# Patient Record
Sex: Female | Born: 1955 | Race: White | Hispanic: No | Marital: Married | Smoking: Never smoker
Health system: Southern US, Community
[De-identification: ages and names within clinical notes are randomized; demographics above are authoritative.]

## PROBLEM LIST (undated history)

## (undated) DIAGNOSIS — Z9889 Other specified postprocedural states: Secondary | ICD-10-CM

## (undated) DIAGNOSIS — IMO0002 Reserved for concepts with insufficient information to code with codable children: Secondary | ICD-10-CM

## (undated) DIAGNOSIS — R531 Weakness: Secondary | ICD-10-CM

## (undated) DIAGNOSIS — M542 Cervicalgia: Secondary | ICD-10-CM

## (undated) DIAGNOSIS — T4145XA Adverse effect of unspecified anesthetic, initial encounter: Secondary | ICD-10-CM

## (undated) DIAGNOSIS — F329 Major depressive disorder, single episode, unspecified: Secondary | ICD-10-CM

## (undated) DIAGNOSIS — J45909 Unspecified asthma, uncomplicated: Secondary | ICD-10-CM

## (undated) DIAGNOSIS — M549 Dorsalgia, unspecified: Secondary | ICD-10-CM

## (undated) DIAGNOSIS — F32A Depression, unspecified: Secondary | ICD-10-CM

## (undated) DIAGNOSIS — J189 Pneumonia, unspecified organism: Secondary | ICD-10-CM

## (undated) DIAGNOSIS — T8859XA Other complications of anesthesia, initial encounter: Secondary | ICD-10-CM

## (undated) DIAGNOSIS — F419 Anxiety disorder, unspecified: Secondary | ICD-10-CM

## (undated) DIAGNOSIS — G8929 Other chronic pain: Secondary | ICD-10-CM

## (undated) DIAGNOSIS — R112 Nausea with vomiting, unspecified: Secondary | ICD-10-CM

## (undated) DIAGNOSIS — G4700 Insomnia, unspecified: Secondary | ICD-10-CM

## (undated) DIAGNOSIS — M62838 Other muscle spasm: Secondary | ICD-10-CM

## (undated) DIAGNOSIS — Z8709 Personal history of other diseases of the respiratory system: Secondary | ICD-10-CM

## (undated) HISTORY — PX: CHOLECYSTECTOMY: SHX55

## (undated) HISTORY — PX: KNEE SURGERY: SHX244

## (undated) HISTORY — PX: DILATION AND CURETTAGE OF UTERUS: SHX78

## (undated) HISTORY — PX: BACK SURGERY: SHX140

## (undated) HISTORY — PX: OTHER SURGICAL HISTORY: SHX169

## (undated) HISTORY — PX: SHOULDER SURGERY: SHX246

## (undated) HISTORY — PX: JOINT REPLACEMENT: SHX530

## (undated) HISTORY — PX: SPINE SURGERY: SHX786

---

## 1998-02-26 ENCOUNTER — Other Ambulatory Visit: Admission: RE | Admit: 1998-02-26 | Discharge: 1998-02-26 | Payer: Self-pay | Admitting: *Deleted

## 2001-09-10 ENCOUNTER — Emergency Department (HOSPITAL_COMMUNITY): Admission: EM | Admit: 2001-09-10 | Discharge: 2001-09-10 | Payer: Self-pay | Admitting: *Deleted

## 2002-10-14 ENCOUNTER — Other Ambulatory Visit: Admission: RE | Admit: 2002-10-14 | Discharge: 2002-10-14 | Payer: Self-pay | Admitting: *Deleted

## 2005-10-23 ENCOUNTER — Encounter (HOSPITAL_COMMUNITY): Admission: RE | Admit: 2005-10-23 | Discharge: 2005-11-22 | Payer: Self-pay | Admitting: Family Medicine

## 2005-11-10 ENCOUNTER — Encounter (INDEPENDENT_AMBULATORY_CARE_PROVIDER_SITE_OTHER): Payer: Self-pay | Admitting: *Deleted

## 2005-11-10 ENCOUNTER — Ambulatory Visit (HOSPITAL_COMMUNITY): Admission: RE | Admit: 2005-11-10 | Discharge: 2005-11-10 | Payer: Self-pay | Admitting: General Surgery

## 2006-10-06 ENCOUNTER — Ambulatory Visit (HOSPITAL_COMMUNITY): Admission: RE | Admit: 2006-10-06 | Discharge: 2006-10-06 | Payer: Self-pay | Admitting: Family Medicine

## 2007-04-20 ENCOUNTER — Encounter: Admission: RE | Admit: 2007-04-20 | Discharge: 2007-04-20 | Payer: Self-pay | Admitting: Orthopedic Surgery

## 2007-05-07 ENCOUNTER — Encounter: Admission: RE | Admit: 2007-05-07 | Discharge: 2007-05-07 | Payer: Self-pay | Admitting: Orthopedic Surgery

## 2007-09-09 ENCOUNTER — Emergency Department (HOSPITAL_COMMUNITY): Admission: EM | Admit: 2007-09-09 | Discharge: 2007-09-09 | Payer: Self-pay | Admitting: Emergency Medicine

## 2007-11-15 ENCOUNTER — Ambulatory Visit (HOSPITAL_COMMUNITY): Admission: RE | Admit: 2007-11-15 | Discharge: 2007-11-15 | Payer: Self-pay | Admitting: Orthopedic Surgery

## 2007-11-15 ENCOUNTER — Ambulatory Visit: Payer: Self-pay | Admitting: Surgery

## 2007-11-15 ENCOUNTER — Encounter (INDEPENDENT_AMBULATORY_CARE_PROVIDER_SITE_OTHER): Payer: Self-pay | Admitting: Orthopedic Surgery

## 2007-11-24 ENCOUNTER — Encounter: Admission: RE | Admit: 2007-11-24 | Discharge: 2007-11-24 | Payer: Self-pay | Admitting: Orthopedic Surgery

## 2008-01-21 ENCOUNTER — Emergency Department (HOSPITAL_COMMUNITY): Admission: EM | Admit: 2008-01-21 | Discharge: 2008-01-21 | Payer: Self-pay | Admitting: Emergency Medicine

## 2008-05-23 ENCOUNTER — Emergency Department (HOSPITAL_COMMUNITY): Admission: EM | Admit: 2008-05-23 | Discharge: 2008-05-23 | Payer: Self-pay | Admitting: Emergency Medicine

## 2009-02-12 ENCOUNTER — Encounter: Admission: RE | Admit: 2009-02-12 | Discharge: 2009-02-12 | Payer: Self-pay | Admitting: Family Medicine

## 2009-06-11 ENCOUNTER — Ambulatory Visit (HOSPITAL_COMMUNITY): Admission: RE | Admit: 2009-06-11 | Discharge: 2009-06-11 | Payer: Self-pay | Admitting: Family Medicine

## 2009-07-16 ENCOUNTER — Inpatient Hospital Stay (HOSPITAL_COMMUNITY): Admission: RE | Admit: 2009-07-16 | Discharge: 2009-07-19 | Payer: Self-pay | Admitting: Neurosurgery

## 2010-02-05 ENCOUNTER — Ambulatory Visit (HOSPITAL_COMMUNITY): Admission: RE | Admit: 2010-02-05 | Discharge: 2010-02-05 | Payer: Self-pay | Admitting: Orthopedic Surgery

## 2010-05-10 IMAGING — RF DG LUMBAR SPINE 2-3V
1 series · 2 of 2 positions shown · non-contrast
Comparison: Earlier intraoperative spot films

CLINICAL DATA: Post lumbar fusion

LUMBAR SPINE - 2-3 VIEW

[Series 1: run · 2 of 2 slices shown]
[im 1/2]
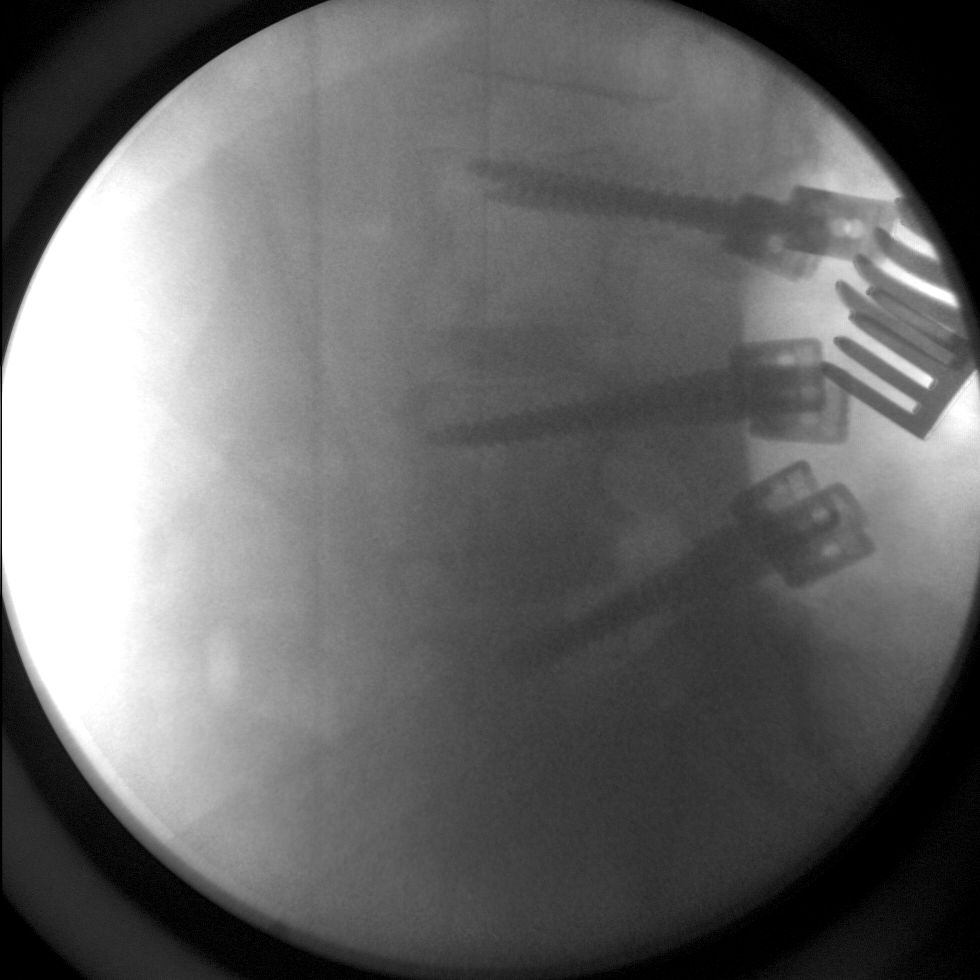
[im 2/2]
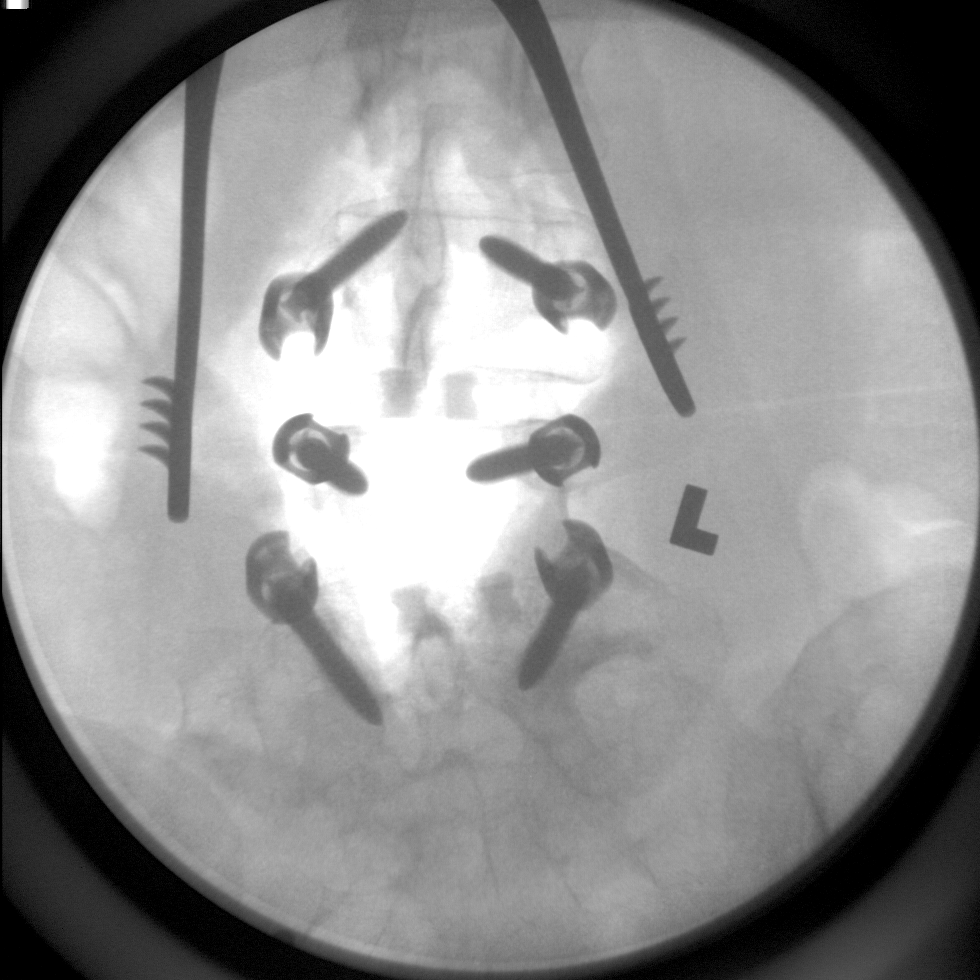

[2 of 2 positions shown; findings below may reference images not displayed]

FINDINGS: AP and lateral views were obtained using a C-arm in the
operating room.

The lateral view is somewhat under penetrated.

The patient has undergone L3-4 and L4-5 discectomy with interbody
fusion and bilateral screw placement.

Good position and alignment into views.
IMPRESSION: Status post L3-L5 fusion with fixation as above.

## 2010-06-13 ENCOUNTER — Ambulatory Visit (HOSPITAL_COMMUNITY)
Admission: RE | Admit: 2010-06-13 | Discharge: 2010-06-13 | Payer: Self-pay | Source: Home / Self Care | Attending: Family Medicine | Admitting: Family Medicine

## 2010-07-21 ENCOUNTER — Encounter: Payer: Self-pay | Admitting: Orthopedic Surgery

## 2010-08-28 ENCOUNTER — Other Ambulatory Visit: Payer: Self-pay | Admitting: Orthopedic Surgery

## 2010-08-28 DIAGNOSIS — M542 Cervicalgia: Secondary | ICD-10-CM

## 2010-08-28 DIAGNOSIS — M5020 Other cervical disc displacement, unspecified cervical region: Secondary | ICD-10-CM

## 2010-08-29 ENCOUNTER — Ambulatory Visit
Admission: RE | Admit: 2010-08-29 | Discharge: 2010-08-29 | Disposition: A | Discharge status: Home / Self Care | Payer: BC Managed Care – PPO | Source: Ambulatory Visit | Attending: Orthopedic Surgery | Admitting: Orthopedic Surgery

## 2010-08-29 DIAGNOSIS — M542 Cervicalgia: Secondary | ICD-10-CM

## 2010-08-29 DIAGNOSIS — M5020 Other cervical disc displacement, unspecified cervical region: Secondary | ICD-10-CM

## 2010-09-15 LAB — CBC
HCT: 42.7 % (ref 36.0–46.0)
Hemoglobin: 15 g/dL (ref 12.0–15.0)
MCHC: 35.1 g/dL (ref 30.0–36.0)
MCV: 91.3 fL (ref 78.0–100.0)
Platelets: 232 10*3/uL (ref 150–400)
RBC: 4.68 MIL/uL (ref 3.87–5.11)
RDW: 12.2 % (ref 11.5–15.5)
WBC: 5.2 10*3/uL (ref 4.0–10.5)

## 2010-09-15 LAB — TYPE AND SCREEN
ABO/RH(D): O POS
Antibody Screen: NEGATIVE

## 2010-09-15 LAB — ABO/RH: ABO/RH(D): O POS

## 2010-11-15 NOTE — H&P (Signed)
NAME:  Stephanie, Noble NO.:  [id number]   MEDICAL RECORD NO.:  [id number]           PATIENT TYPE:  AMB   LOCATION:                                FACILITY:  [facility]   PHYSICIAN:  Dalia Heading, M.D.  DATE OF BIRTH:  [birth year]   DATE OF ADMISSION:  11/10/2005  DATE OF DISCHARGE:  LH                                HISTORY & PHYSICAL   CHIEF COMPLAINT:  Chronic cholecystitis.   HISTORY OF PRESENT ILLNESS:  The patient is a 55 year old white female who  is referred for evaluation and treatment of biliary colic secondary to  chronic cholecystitis.  The patient has been having right upper quadrant  abdominal pain with radiation to the right flank, nausea, and bloating for  many years.  She does have fatty food intolerance.  No fever, chills, or  jaundice have been noted.   PAST MEDICAL HISTORY:  Unremarkable.   PAST SURGICAL HISTORY:  1.  Spinal fusion.  2.  D&C.  3.  Arthroscopy, left knee.   CURRENT MEDICATIONS:  None.   ALLERGIES:  No known drug allergies.   REVIEW OF SYSTEMS:  Noncontributory.   PHYSICAL EXAMINATION:  GENERAL:  The patient is a well-developed, well-  nourished white female in no acute distress.  HEENT:  No scleral icterus.  LUNGS:  Clear to auscultation with equal breath sounds bilaterally.  HEART:  Heart examination reveals a regular rate and rhythm without S3, S4,  or murmurs.  ABDOMEN:  Soft and nondistended.  She is tender in the right upper quadrant  to deep palpation.  No hepatosplenomegaly, masses, hernias are identified.  Ultrasound of the gallbladder reveals no cholelithiasis.  HIDA scan reveals  chronic cholecystitis with a low gallbladder ejection fraction, reproducible  symptoms with a fatty meal.   IMPRESSION:  Chronic cholecystitis.   PLAN:  The patient is scheduled for a laparoscopic cholecystectomy on Nov 10, 2005.  The risks and benefits of the procedure including bleeding,  infection, hepatobiliary injury, and the  possibility of an open procedure  were fully explained to the patient, gave informed consent.      Dalia Heading, M.D.  Electronically Signed     MAJ/MEDQ  D:  11/04/2005  T:  11/04/2005  Job:  [id number]   cc:   Donna Bernard, M.D.  Fax: [phone]

## 2010-11-15 NOTE — Op Note (Signed)
NAME:  Stephanie Noble, PARKINSON NO.:  [id number]   MEDICAL RECORD NO.:  [id number]          PATIENT TYPE:  AMB   LOCATION:  DAY                           FACILITY:  [facility]   PHYSICIAN:  Dalia Heading, M.D.  DATE OF BIRTH:  05-15-1956   DATE OF PROCEDURE:  11/10/2005  DATE OF DISCHARGE:                                 OPERATIVE REPORT   PREOPERATIVE DIAGNOSES:  1.  Chronic cholecystitis.  2.  Umbilical hernia.   POSTOPERATIVE DIAGNOSES:  1.  Chronic cholecystitis.  2.  Umbilical hernia.   PROCEDURES:  1.  Laparoscopic cholecystectomy.  2.  Umbilical herniorrhaphy.   SURGEON:  Dalia Heading, M.D.   ASSISTANT:  Bernerd Limbo. Leona Carry, M.D.   ANESTHESIA:  General endotracheal.   INDICATIONS:  The patient is a 55 year old white female who presents with  biliary colic secondary to chronic cholecystitis as well as an umbilical  hernia.  The patient now comes the operating for laparoscopic  cholecystectomy and umbilical herniorrhaphy.  The risks and benefits of both  procedures, including bleeding, infection, hepatobiliary injury, the  possibly an open procedure, and the possibility of recurrence of the hernia  were fully explained to the patient, who gave informed consent.   PROCEDURE NOTE:  The patient was placed in the supine position.  After  induction of general endotracheal anesthesia, the abdomen was prepped and  draped using the usual sterile technique with Betadine.  Surgical site  confirmation was performed.   An infraumbilical incision was made down to the fascia.  Veress needle was  introduced into the abdominal cavity, and confirmation of placement was done  using the saline drop test.  The abdomen was then insufflated to 16 mmHg  pressure.  A 11 mm trocar was introduced into the abdominal cavity under  direct visualization without difficulty.  The patient was placed in reverse  Trendelenburg position.  An additional 11 mm trocar was placed in the  epigastric region.  A 5 mm trocar was placed in the right upper quadrant and  right flank regions.  The liver was inspected and noted to be within normal  limits.  The gallbladder was retracted superiorly and laterally.  The  dissection was taken down to the infundibulum of the gallbladder.  The  cystic duct was first identified.  Its juncture to the infundibulum was  fully identified.  Endoclips were placed proximally distally on the cystic  duct, and the cystic duct was divided.  This was likewise done to the cystic  artery.  The gallbladder was then freed away from the gallbladder fossa  using Bovie electrocautery.  The gallbladder was delivered through the  epigastric trocar site using an EndoCatch bag.  The gallbladder fossa was  inspected.  No abnormal bleeding or bile leakage was noted.  Surgicel was  placed in the gallbladder fossa.  All fluid and air were then evacuated from  the abdominal cavity prior to removal of the trocars.   All wounds were irrigated with normal saline.  The infraumbilical skin was  extended and the umbilicus freed away from the underlying umbilical hernia  defect.  The fascia was identified.  Two separate holes were noted and were  made into one.  The fascia was then closed transversely using 0 Surgidek  interrupted sutures.  The base of the umbilicus was secured back to the  fascia using a 2-0 Vicryl interrupted suture.  The subcutaneous layer was  reapproximated using a 3-0 Vicryl interrupted suture.  The skin was closed  using staples.  The other skin incisions were also closed with staples.  0.5  cm Sensorcaine was instilled into the surrounding incisions.  Betadine  ointment and dry sterile dressing were applied.   All tape and needle counts were correct and the procedure.  The patient was  extubated in the operating room and went back to the recovery room awake, in  stable condition.   COMPLICATIONS:  None.   SPECIMEN:  Gallbladder.   BLOOD  LOSS:  Minimal.      Dalia Heading, M.D.  Electronically Signed     MAJ/MEDQ  D:  11/10/2005  T:  11/10/2005  Job:  [id number]   cc:   Donna Bernard, M.D.  Fax: [phone]

## 2011-04-01 LAB — URINALYSIS, ROUTINE W REFLEX MICROSCOPIC
Glucose, UA: NEGATIVE
Ketones, ur: >80 — AB
Nitrite: NEGATIVE
Urobilinogen, UA: 0.2
pH: 5.5

## 2011-04-01 LAB — CBC
HCT: 40.4
Hemoglobin: 14.1
MCHC: 35
Platelets: 228
RDW: 12.2

## 2011-04-01 LAB — DIFFERENTIAL
Lymphs Abs: 1.2
Monocytes Absolute: 0.4
Monocytes Relative: 7
Neutro Abs: 4.2
Neutrophils Relative %: 72

## 2011-04-01 LAB — COMPREHENSIVE METABOLIC PANEL
Albumin: 3.5
BUN: 10
Calcium: 8.5
Glucose, Bld: 102 — ABNORMAL HIGH
Potassium: 3.6
Sodium: 133 — ABNORMAL LOW
Total Protein: 6.5

## 2011-04-01 LAB — POCT CARDIAC MARKERS
CKMB, poc: <1 — ABNORMAL LOW
Myoglobin, poc: 55.9
Troponin i, poc: <0.05
Troponin i, poc: <0.05

## 2011-04-01 LAB — CULTURE, BLOOD (ROUTINE X 2)
Culture: NO GROWTH
Report Status: 11292009

## 2011-04-01 LAB — URINE MICROSCOPIC-ADD ON

## 2011-04-10 ENCOUNTER — Encounter (HOSPITAL_COMMUNITY)
Admission: RE | Admit: 2011-04-10 | Discharge: 2011-04-10 | Disposition: A | Discharge status: Home / Self Care | Payer: BC Managed Care – PPO | Source: Ambulatory Visit | Attending: Neurosurgery | Admitting: Neurosurgery

## 2011-04-10 LAB — CBC
HCT: 36.9 % (ref 36.0–46.0)
Hemoglobin: 12.5 g/dL (ref 12.0–15.0)
MCH: 29.4 pg (ref 26.0–34.0)
MCHC: 33.9 g/dL (ref 30.0–36.0)
MCV: 86.8 fL (ref 78.0–100.0)
Platelets: 194 10*3/uL (ref 150–400)
RBC: 4.25 MIL/uL (ref 3.87–5.11)
RDW: 14.4 % (ref 11.5–15.5)
WBC: 4.6 10*3/uL (ref 4.0–10.5)

## 2011-04-10 LAB — SURGICAL PCR SCREEN
MRSA, PCR: NEGATIVE
Staphylococcus aureus: NEGATIVE

## 2011-04-16 ENCOUNTER — Ambulatory Visit (HOSPITAL_COMMUNITY): Payer: BC Managed Care – PPO

## 2011-04-16 ENCOUNTER — Inpatient Hospital Stay (HOSPITAL_COMMUNITY)
Admission: RE | Admit: 2011-04-16 | Discharge: 2011-04-17 | DRG: 865 | Disposition: A | Discharge status: Home / Self Care | Payer: BC Managed Care – PPO | Source: Ambulatory Visit | Attending: Neurosurgery | Admitting: Neurosurgery

## 2011-04-16 DIAGNOSIS — M5030 Other cervical disc degeneration, unspecified cervical region: Secondary | ICD-10-CM | POA: Diagnosis present

## 2011-04-16 DIAGNOSIS — Q762 Congenital spondylolisthesis: Secondary | ICD-10-CM

## 2011-04-16 DIAGNOSIS — M47812 Spondylosis without myelopathy or radiculopathy, cervical region: Principal | ICD-10-CM | POA: Diagnosis present

## 2011-04-16 DIAGNOSIS — Z01812 Encounter for preprocedural laboratory examination: Secondary | ICD-10-CM

## 2011-04-16 DIAGNOSIS — J45909 Unspecified asthma, uncomplicated: Secondary | ICD-10-CM | POA: Diagnosis present

## 2011-04-16 DIAGNOSIS — Z79899 Other long term (current) drug therapy: Secondary | ICD-10-CM

## 2011-04-18 NOTE — Op Note (Signed)
NAME:  Stephanie Noble, Stephanie Noble NO.:  [id number]  MEDICAL RECORD NO.:  [id number]  LOCATION:  3524                         FACILITY:  [facility]  PHYSICIAN:  Hewitt Shorts, M.D.DATE OF BIRTH:  Oct 20, 1955  DATE OF PROCEDURE:  04/16/2011 DATE OF DISCHARGE:                              OPERATIVE REPORT   PREOPERATIVE DIAGNOSES:  Cervical spondylosis, cervical degenerative disk disease, cervical spondylolisthesis and cervicalgia.  POSTOPERATIVE DIAGNOSES:  Cervical spondylosis, cervical degenerative disk disease, cervical spondylolisthesis and cervicalgia.  PROCEDURE:  C4-5, C5-6 and C6-7 anterior cervical decompression arthrodesis with allograft and tether cervical plating.  SURGEON:  Hewitt Shorts, MD  ASSISTANT:  Cristi Loron, MD  ANESTHESIA:  General endotracheal.  INDICATIONS:  The patient is a 55 year old woman who presented with steadily worsening neck pain.  She has advanced degenerative disk disease and spondylosis at C5-6 and C6-7 levels with an anterolisthesis of C4 and 5.  Decision was made to proceed with three-level decompression arthrodesis.  PROCEDURE:  The patient was brought to the operative room and placed under general endotracheal anesthesia.  The patient was placed in 10 pounds of Holter traction.  Neck was prepped with Betadine soap and solution, draped in a sterile fashion.  The midline incision was infiltrated with local anesthetic with epinephrine and an incision was made on the left side of the neck paralleling the anterior border of the left sternocleidomastoid.  The dissection was carried down to the subcutaneous tissue and platysma.  Bipolar cautery and electrocautery were used to maintain hemostasis.  Dissection was then carried out through a plane leaving the sternocleidomastoid, carotid artery and jugular vein laterally and trachea and esophagus medially.  There was bridging vein across this area from the anterior jugular  to the internal jugular that was suture ligated as well as hemoclipped, which allowed [place] to dissect down to the spine.  We identified the ventral aspect of the vertebral column and x-ray was taken, and the C4-5, C5-6, and C6-7 levels were identified.  Diskectomy was begun at each level with incision at the anulus continued with microcurettes and pituitary rongeurs.  The cartilaginous endplates were removed using microcurettes along with the high-speed drill and the operating microscope was then draped, brought into the field to provide for additional navigation, illumination, and visualization.  We continued the decompression using microdissection and microsurgical technique.  The diskectomy and removal of the cartilaginous endplates was continued posteriorly to the posterior aspect of the disk space.  Posterior osteophytic overgrowth was removed using the high-speed drill along with 2-mm Kerrison punch with thin footplate.  Posterior longitudinal ligament was removed, well decompressed the spinal canal and thecal sac.  We examined the neural foramen essentially with a peg.  Spinal overgrowth was removed decompressing the exiting nerve roots.  Hemostasis established using Gelfoam soaked in thrombin and then we measured the height of the intervertebral disk space.  We selected a 6-mm allograft implant to be placed in the intervertebral disk space at each level.  Each graft was hydrated and saline solution positioned in the intervertebral disk space and countersunk.  We then selected a 49-mm tether cervical plate that was positioned over the fusion construct and secured to the  vertebra with 4 x 40-mm screws using a pair of variable screws at C4, a single fixed screw at C5, a single variable screw at C6 and a pair of variable screws at C7.  Once all 6 screws were placed, final tightening was performed.  The wound was irrigated with bacitracin solution, checked for hemostasis, which was  established and confirmed, then we proceeded with closure.  The fascia was closed with interrupted inverted 2-0 undyed Vicryl sutures, subcutaneous and subcuticular were closed with inverted 3-0 undyed Vicryl sutures, skin was closed with Dermabond.  The procedure was tolerated well.  Estimated blood loss was 50 mL.  Sponge and needle count were correct.  Following surgery, the patient was to be reversed from the anesthetic, extubated, and transferred to the recovery room for further care.     Hewitt Shorts, M.D.     RWN/MEDQ  D:  04/16/2011  T:  04/16/2011  Job:  [id number]  Electronically Signed by Shirlean Kelly M.D. on 04/18/2011 12:46:20 PM

## 2012-04-21 ENCOUNTER — Other Ambulatory Visit: Payer: Self-pay | Admitting: Neurosurgery

## 2012-05-05 ENCOUNTER — Encounter (HOSPITAL_COMMUNITY): Payer: Self-pay

## 2012-05-10 ENCOUNTER — Other Ambulatory Visit (HOSPITAL_COMMUNITY): Payer: BC Managed Care – PPO

## 2012-05-10 ENCOUNTER — Encounter (HOSPITAL_COMMUNITY): Payer: Self-pay

## 2012-05-10 ENCOUNTER — Encounter (HOSPITAL_COMMUNITY)
Admission: RE | Admit: 2012-05-10 | Discharge: 2012-05-10 | Disposition: A | Discharge status: Home / Self Care | Payer: BC Managed Care – PPO | Source: Ambulatory Visit | Attending: Neurosurgery | Admitting: Neurosurgery

## 2012-05-10 ENCOUNTER — Encounter (HOSPITAL_COMMUNITY)
Admission: RE | Admit: 2012-05-10 | Discharge: 2012-05-10 | Disposition: A | Discharge status: Home / Self Care | Payer: BC Managed Care – PPO | Source: Ambulatory Visit | Attending: Anesthesiology | Admitting: Anesthesiology

## 2012-05-10 HISTORY — DX: Unspecified asthma, uncomplicated: J45.909

## 2012-05-10 HISTORY — DX: Other complications of anesthesia, initial encounter: T88.59XA

## 2012-05-10 HISTORY — DX: Dorsalgia, unspecified: M54.9

## 2012-05-10 HISTORY — DX: Other specified postprocedural states: Z98.890

## 2012-05-10 HISTORY — DX: Pneumonia, unspecified organism: J18.9

## 2012-05-10 HISTORY — DX: Other chronic pain: G89.29

## 2012-05-10 HISTORY — DX: Adverse effect of unspecified anesthetic, initial encounter: T41.45XA

## 2012-05-10 HISTORY — DX: Other specified postprocedural states: R11.2

## 2012-05-10 HISTORY — DX: Reserved for concepts with insufficient information to code with codable children: IMO0002

## 2012-05-10 HISTORY — DX: Cervicalgia: M54.2

## 2012-05-10 LAB — SURGICAL PCR SCREEN
MRSA, PCR: NEGATIVE
Staphylococcus aureus: NEGATIVE

## 2012-05-10 LAB — CBC
HCT: 41.4 % (ref 36.0–46.0)
MCHC: 35 g/dL (ref 30.0–36.0)
Platelets: 199 10*3/uL (ref 150–400)
RDW: 12.2 % (ref 11.5–15.5)

## 2012-05-10 NOTE — Pre-Procedure Instructions (Signed)
20 LUN MURO  05/10/2012   Your procedure is scheduled on:  Monday May 17, 2012  Report to [facility] at 7:00 AM.  Call this number if you have problems the morning of surgery: [phone]   Remember:   Do not eat food or drink After Midnight.      Take these medicines the morning of surgery with A SIP OF WATER: albuterol ( if needed), hydrocodone   Do not wear jewelry, make-up or nail polish.  Do not wear lotions, powders, or perfumes.  Do not shave 48 hours prior to surgery. Men may shave face and neck.  Do not bring valuables to the hospital.  Contacts, dentures or bridgework may not be worn into surgery.  Leave suitcase in the car. After surgery it may be brought to your room.  For patients admitted to the hospital, checkout time is 11:00 AM the day of discharge.   Patients discharged the day of surgery will not be allowed to drive home.  Name and phone number of your driver: family / friend  Special Instructions: Shower using CHG 2 nights before surgery and the night before surgery.  If you shower the day of surgery use CHG.  Use special wash - you have one bottle of CHG for all showers.  You should use approximately 1/3 of the bottle for each shower.   Please read over the following fact sheets that you were given: Pain Booklet, Coughing and Deep Breathing, MRSA Information and Surgical Site Infection Prevention

## 2012-05-16 MED ORDER — CEFAZOLIN SODIUM-DEXTROSE 2-3 GM-% IV SOLR
2.0000 g | INTRAVENOUS | Status: DC
  Filled 2012-05-16: qty 50

## 2012-05-17 ENCOUNTER — Encounter (HOSPITAL_COMMUNITY)
Admission: RE | Disposition: A | Discharge status: Home / Self Care | Payer: Self-pay | Source: Ambulatory Visit | Attending: Neurosurgery

## 2012-05-17 ENCOUNTER — Encounter (HOSPITAL_COMMUNITY): Payer: Self-pay | Admitting: Anesthesiology

## 2012-05-17 ENCOUNTER — Observation Stay (HOSPITAL_COMMUNITY)
Admission: RE | Admit: 2012-05-17 | Discharge: 2012-05-19 | Disposition: A | Discharge status: Home / Self Care | Payer: BC Managed Care – PPO | Source: Ambulatory Visit | Attending: Neurosurgery | Admitting: Neurosurgery

## 2012-05-17 ENCOUNTER — Ambulatory Visit (HOSPITAL_COMMUNITY): Payer: BC Managed Care – PPO | Admitting: Anesthesiology

## 2012-05-17 ENCOUNTER — Ambulatory Visit (HOSPITAL_COMMUNITY): Payer: BC Managed Care – PPO

## 2012-05-17 ENCOUNTER — Encounter (HOSPITAL_COMMUNITY): Payer: Self-pay | Admitting: Surgery

## 2012-05-17 DIAGNOSIS — T84498A Other mechanical complication of other internal orthopedic devices, implants and grafts, initial encounter: Principal | ICD-10-CM | POA: Insufficient documentation

## 2012-05-17 DIAGNOSIS — J45909 Unspecified asthma, uncomplicated: Secondary | ICD-10-CM | POA: Insufficient documentation

## 2012-05-17 DIAGNOSIS — M47812 Spondylosis without myelopathy or radiculopathy, cervical region: Secondary | ICD-10-CM | POA: Insufficient documentation

## 2012-05-17 DIAGNOSIS — Z01818 Encounter for other preprocedural examination: Secondary | ICD-10-CM | POA: Insufficient documentation

## 2012-05-17 DIAGNOSIS — Z01812 Encounter for preprocedural laboratory examination: Secondary | ICD-10-CM | POA: Insufficient documentation

## 2012-05-17 DIAGNOSIS — Z981 Arthrodesis status: Secondary | ICD-10-CM | POA: Insufficient documentation

## 2012-05-17 DIAGNOSIS — Y832 Surgical operation with anastomosis, bypass or graft as the cause of abnormal reaction of the patient, or of later complication, without mention of misadventure at the time of the procedure: Secondary | ICD-10-CM | POA: Insufficient documentation

## 2012-05-17 DIAGNOSIS — M5030 Other cervical disc degeneration, unspecified cervical region: Secondary | ICD-10-CM | POA: Insufficient documentation

## 2012-05-17 DIAGNOSIS — Z01810 Encounter for preprocedural cardiovascular examination: Secondary | ICD-10-CM | POA: Insufficient documentation

## 2012-05-17 HISTORY — PX: POSTERIOR CERVICAL FUSION/FORAMINOTOMY: SHX5038

## 2012-05-17 SURGERY — POSTERIOR CERVICAL FUSION/FORAMINOTOMY LEVEL 2
Anesthesia: General | Site: Spine Cervical | Wound class: Clean

## 2012-05-17 MED ORDER — NEOSTIGMINE METHYLSULFATE 1 MG/ML IJ SOLN
INTRAMUSCULAR | Status: DC | PRN
  Administered 2012-05-17: 3 mg via INTRAVENOUS

## 2012-05-17 MED ORDER — HYDROMORPHONE HCL PF 1 MG/ML IJ SOLN
0.2500 mg | INTRAMUSCULAR | Status: DC | PRN
  Administered 2012-05-17 (×4): 0.5 mg via INTRAVENOUS

## 2012-05-17 MED ORDER — PHENYLEPHRINE HCL 10 MG/ML IJ SOLN
INTRAMUSCULAR | Status: DC | PRN
  Administered 2012-05-17 (×3): 80 ug via INTRAVENOUS

## 2012-05-17 MED ORDER — PHENOL 1.4 % MT LIQD: 1.0000 | OROMUCOSAL | Status: DC | PRN

## 2012-05-17 MED ORDER — PROPOFOL 10 MG/ML IV BOLUS
INTRAVENOUS | Status: DC | PRN
  Administered 2012-05-17: 150 mg via INTRAVENOUS

## 2012-05-17 MED ORDER — POLYETHYLENE GLYCOL 3350 17 G PO PACK
17.0000 g | PACK | Freq: Every day | ORAL | Status: DC
  Administered 2012-05-17 – 2012-05-18 (×2): 17 g via ORAL
  Filled 2012-05-17 (×3): qty 1

## 2012-05-17 MED ORDER — HYDROXYZINE HCL 50 MG/ML IM SOLN: 50.0000 mg | INTRAMUSCULAR | Status: DC | PRN

## 2012-05-17 MED ORDER — ALBUTEROL SULFATE HFA 108 (90 BASE) MCG/ACT IN AERS: 2.0000 | INHALATION_SPRAY | Freq: Four times a day (QID) | RESPIRATORY_TRACT | Status: DC | PRN

## 2012-05-17 MED ORDER — FENTANYL CITRATE 0.05 MG/ML IJ SOLN
INTRAMUSCULAR | Status: DC | PRN
  Administered 2012-05-17: 100 ug via INTRAVENOUS
  Administered 2012-05-17: 50 ug via INTRAVENOUS
  Administered 2012-05-17: 100 ug via INTRAVENOUS

## 2012-05-17 MED ORDER — KETOROLAC TROMETHAMINE 30 MG/ML IJ SOLN
30.0000 mg | Freq: Once | INTRAMUSCULAR | Status: AC
  Administered 2012-05-17: 30 mg via INTRAVENOUS

## 2012-05-17 MED ORDER — MIDAZOLAM HCL 5 MG/5ML IJ SOLN
INTRAMUSCULAR | Status: DC | PRN
  Administered 2012-05-17: 2 mg via INTRAVENOUS

## 2012-05-17 MED ORDER — OXYCODONE HCL 5 MG PO TABS
ORAL_TABLET | ORAL | Status: AC
  Filled 2012-05-17: qty 1

## 2012-05-17 MED ORDER — SODIUM CHLORIDE 0.9 % IV SOLN
INTRAVENOUS | Status: AC
  Filled 2012-05-17: qty 500

## 2012-05-17 MED ORDER — EPHEDRINE SULFATE 50 MG/ML IJ SOLN
INTRAMUSCULAR | Status: DC | PRN
  Administered 2012-05-17: 10 mg via INTRAVENOUS

## 2012-05-17 MED ORDER — LIDOCAINE HCL (CARDIAC) 20 MG/ML IV SOLN
INTRAVENOUS | Status: DC | PRN
  Administered 2012-05-17: 70 mg via INTRAVENOUS

## 2012-05-17 MED ORDER — SODIUM CHLORIDE 0.9 % IV SOLN: 250.0000 mL | INTRAVENOUS | Status: DC

## 2012-05-17 MED ORDER — MENTHOL 3 MG MT LOZG: 1.0000 | LOZENGE | OROMUCOSAL | Status: DC | PRN

## 2012-05-17 MED ORDER — KCL IN DEXTROSE-NACL 20-5-0.45 MEQ/L-%-% IV SOLN
INTRAVENOUS | Status: DC
  Filled 2012-05-17 (×9): qty 1000

## 2012-05-17 MED ORDER — OXYCODONE HCL 5 MG PO TABS
5.0000 mg | ORAL_TABLET | Freq: Once | ORAL | Status: AC | PRN
  Administered 2012-05-17: 5 mg via ORAL

## 2012-05-17 MED ORDER — CEFAZOLIN SODIUM-DEXTROSE 2-3 GM-% IV SOLR
INTRAVENOUS | Status: DC | PRN
  Administered 2012-05-17: 2 g via INTRAVENOUS

## 2012-05-17 MED ORDER — MAGNESIUM HYDROXIDE 400 MG/5ML PO SUSP: 30.0000 mL | Freq: Every day | ORAL | Status: DC | PRN

## 2012-05-17 MED ORDER — ACETAMINOPHEN 325 MG PO TABS: 650.0000 mg | ORAL_TABLET | ORAL | Status: DC | PRN

## 2012-05-17 MED ORDER — ZOLPIDEM TARTRATE 5 MG PO TABS: 5.0000 mg | ORAL_TABLET | Freq: Every evening | ORAL | Status: DC | PRN

## 2012-05-17 MED ORDER — CYCLOBENZAPRINE HCL 10 MG PO TABS
10.0000 mg | ORAL_TABLET | Freq: Three times a day (TID) | ORAL | Status: DC | PRN
  Administered 2012-05-17 – 2012-05-19 (×5): 10 mg via ORAL
  Filled 2012-05-17 (×5): qty 1

## 2012-05-17 MED ORDER — ONDANSETRON HCL 4 MG/2ML IJ SOLN
INTRAMUSCULAR | Status: DC | PRN
  Administered 2012-05-17: 4 mg via INTRAVENOUS

## 2012-05-17 MED ORDER — SODIUM CHLORIDE 0.9 % IJ SOLN
3.0000 mL | INTRAMUSCULAR | Status: DC | PRN
  Administered 2012-05-18: 3 mL via INTRAVENOUS

## 2012-05-17 MED ORDER — BACITRACIN ZINC 500 UNIT/GM EX OINT
TOPICAL_OINTMENT | CUTANEOUS | Status: DC | PRN
  Administered 2012-05-17: 1 via TOPICAL

## 2012-05-17 MED ORDER — ONDANSETRON HCL 4 MG/2ML IJ SOLN: 4.0000 mg | Freq: Four times a day (QID) | INTRAMUSCULAR | Status: DC | PRN

## 2012-05-17 MED ORDER — ROCURONIUM BROMIDE 100 MG/10ML IV SOLN
INTRAVENOUS | Status: DC | PRN
  Administered 2012-05-17: 50 mg via INTRAVENOUS

## 2012-05-17 MED ORDER — KETOROLAC TROMETHAMINE 30 MG/ML IJ SOLN
INTRAMUSCULAR | Status: AC
  Filled 2012-05-17: qty 1

## 2012-05-17 MED ORDER — MORPHINE SULFATE 4 MG/ML IJ SOLN
4.0000 mg | INTRAMUSCULAR | Status: DC | PRN
  Administered 2012-05-17: 4 mg via INTRAMUSCULAR
  Filled 2012-05-17: qty 1

## 2012-05-17 MED ORDER — BISACODYL 10 MG RE SUPP: 10.0000 mg | Freq: Every day | RECTAL | Status: DC | PRN

## 2012-05-17 MED ORDER — HEMOSTATIC AGENTS (NO CHARGE) OPTIME
TOPICAL | Status: DC | PRN
  Administered 2012-05-17: 1 via TOPICAL

## 2012-05-17 MED ORDER — KETOROLAC TROMETHAMINE 30 MG/ML IJ SOLN
30.0000 mg | Freq: Four times a day (QID) | INTRAMUSCULAR | Status: DC
  Administered 2012-05-17 – 2012-05-19 (×7): 30 mg via INTRAVENOUS
  Filled 2012-05-17 (×8): qty 1

## 2012-05-17 MED ORDER — DEXAMETHASONE SODIUM PHOSPHATE 4 MG/ML IJ SOLN
INTRAMUSCULAR | Status: DC | PRN
  Administered 2012-05-17: 4 mg via INTRAVENOUS

## 2012-05-17 MED ORDER — BUPIVACAINE HCL (PF) 0.5 % IJ SOLN
INTRAMUSCULAR | Status: DC | PRN
  Administered 2012-05-17: 7 mL

## 2012-05-17 MED ORDER — HYDROXYZINE HCL 25 MG PO TABS: 50.0000 mg | ORAL_TABLET | ORAL | Status: DC | PRN

## 2012-05-17 MED ORDER — SODIUM CHLORIDE 0.9 % IJ SOLN
3.0000 mL | Freq: Two times a day (BID) | INTRAMUSCULAR | Status: DC
  Administered 2012-05-17 – 2012-05-18 (×3): 3 mL via INTRAVENOUS

## 2012-05-17 MED ORDER — SODIUM CHLORIDE 0.9 % IR SOLN
Status: DC | PRN
  Administered 2012-05-17: 11:00:00

## 2012-05-17 MED ORDER — BACITRACIN 50000 UNITS IM SOLR
INTRAMUSCULAR | Status: AC
  Filled 2012-05-17: qty 1

## 2012-05-17 MED ORDER — 0.9 % SODIUM CHLORIDE (POUR BTL) OPTIME
TOPICAL | Status: DC | PRN
  Administered 2012-05-17: 1000 mL

## 2012-05-17 MED ORDER — HYDROMORPHONE HCL PF 1 MG/ML IJ SOLN
INTRAMUSCULAR | Status: AC
  Filled 2012-05-17: qty 1

## 2012-05-17 MED ORDER — ACETAMINOPHEN 650 MG RE SUPP: 650.0000 mg | RECTAL | Status: DC | PRN

## 2012-05-17 MED ORDER — LACTATED RINGERS IV SOLN
INTRAVENOUS | Status: DC | PRN
  Administered 2012-05-17 (×2): via INTRAVENOUS

## 2012-05-17 MED ORDER — ACETAMINOPHEN 10 MG/ML IV SOLN
INTRAVENOUS | Status: AC
  Administered 2012-05-17: 1000 mg via INTRAVENOUS
  Filled 2012-05-17: qty 100

## 2012-05-17 MED ORDER — ALUM & MAG HYDROXIDE-SIMETH 200-200-20 MG/5ML PO SUSP: 30.0000 mL | Freq: Four times a day (QID) | ORAL | Status: DC | PRN

## 2012-05-17 MED ORDER — GLYCOPYRROLATE 0.2 MG/ML IJ SOLN
INTRAMUSCULAR | Status: DC | PRN
  Administered 2012-05-17: 0.4 mg via INTRAVENOUS

## 2012-05-17 MED ORDER — ACETAMINOPHEN 10 MG/ML IV SOLN
1000.0000 mg | Freq: Four times a day (QID) | INTRAVENOUS | Status: AC
  Administered 2012-05-17 – 2012-05-18 (×4): 1000 mg via INTRAVENOUS
  Filled 2012-05-17 (×4): qty 100

## 2012-05-17 MED ORDER — OXYCODONE HCL 5 MG/5ML PO SOLN: 5.0000 mg | Freq: Once | ORAL | Status: AC | PRN

## 2012-05-17 MED ORDER — THROMBIN 5000 UNITS EX KIT
PACK | CUTANEOUS | Status: DC | PRN
  Administered 2012-05-17 (×2): 5000 [IU] via TOPICAL

## 2012-05-17 MED ORDER — LIDOCAINE-EPINEPHRINE 1 %-1:100000 IJ SOLN
INTRAMUSCULAR | Status: DC | PRN
  Administered 2012-05-17: 7 mL

## 2012-05-17 MED ORDER — OXYCODONE HCL 5 MG PO TABS
5.0000 mg | ORAL_TABLET | ORAL | Status: DC | PRN
  Administered 2012-05-17 – 2012-05-19 (×6): 10 mg via ORAL
  Filled 2012-05-17 (×6): qty 2

## 2012-05-17 MED ORDER — DOCUSATE SODIUM 100 MG PO CAPS
100.0000 mg | ORAL_CAPSULE | Freq: Two times a day (BID) | ORAL | Status: DC
  Administered 2012-05-17 – 2012-05-18 (×3): 100 mg via ORAL
  Filled 2012-05-17 (×4): qty 1

## 2012-05-17 SURGICAL SUPPLY — 77 items
ADH SKN CLS APL DERMABOND .7 (GAUZE/BANDAGES/DRESSINGS)
ADH SKN CLS LQ APL DERMABOND (GAUZE/BANDAGES/DRESSINGS) ×2
BAG DECANTER FOR FLEXI CONT (MISCELLANEOUS) ×2 IMPLANT
BIT DRILL NEURO 2X3.1 SFT TUCH (MISCELLANEOUS) ×1 IMPLANT
BIT DRILL WIRE PASS 1.3MM (BIT) IMPLANT
BLADE SURG 11 STRL SS (BLADE) ×1 IMPLANT
BLADE SURG ROTATE 9660 (MISCELLANEOUS) ×1 IMPLANT
BRUSH SCRUB EZ PLAIN DRY (MISCELLANEOUS) ×2 IMPLANT
CANISTER SUCTION 2500CC (MISCELLANEOUS) ×2 IMPLANT
CLOTH BEACON ORANGE TIMEOUT ST (SAFETY) ×2 IMPLANT
CONT SPEC 4OZ CLIKSEAL STRL BL (MISCELLANEOUS) ×2 IMPLANT
COVER TABLE BACK 60X90 (DRAPES) ×1 IMPLANT
DECANTER SPIKE VIAL GLASS SM (MISCELLANEOUS) ×2 IMPLANT
DERMABOND ADHESIVE PROPEN (GAUZE/BANDAGES/DRESSINGS) ×2
DERMABOND ADVANCED (GAUZE/BANDAGES/DRESSINGS)
DERMABOND ADVANCED .7 DNX12 (GAUZE/BANDAGES/DRESSINGS) ×1 IMPLANT
DERMABOND ADVANCED .7 DNX6 (GAUZE/BANDAGES/DRESSINGS) IMPLANT
DRAPE C-ARM 42X72 X-RAY (DRAPES) ×4 IMPLANT
DRAPE C-ARMOR (DRAPES) ×1 IMPLANT
DRAPE LAPAROTOMY 100X72 PEDS (DRAPES) ×2 IMPLANT
DRAPE POUCH INSTRU U-SHP 10X18 (DRAPES) ×2 IMPLANT
DRAPE PROXIMA HALF (DRAPES) IMPLANT
DRILL NEURO 2X3.1 SOFT TOUCH (MISCELLANEOUS) ×2
DRILL WIRE PASS 1.3MM (BIT)
DRSG EMULSION OIL 3X3 NADH (GAUZE/BANDAGES/DRESSINGS) IMPLANT
ELECT REM PT RETURN 9FT ADLT (ELECTROSURGICAL) ×2
ELECTRODE REM PT RTRN 9FT ADLT (ELECTROSURGICAL) ×1 IMPLANT
EVACUATOR 1/8 PVC DRAIN (DRAIN) IMPLANT
GAUZE SPONGE 4X4 16PLY XRAY LF (GAUZE/BANDAGES/DRESSINGS) IMPLANT
GLOVE BIO SURGEON STRL SZ 6.5 (GLOVE) ×2 IMPLANT
GLOVE BIO SURGEON STRL SZ8.5 (GLOVE) ×2 IMPLANT
GLOVE BIOGEL PI IND STRL 6.5 (GLOVE) IMPLANT
GLOVE BIOGEL PI IND STRL 8 (GLOVE) ×1 IMPLANT
GLOVE BIOGEL PI INDICATOR 6.5 (GLOVE) ×1
GLOVE BIOGEL PI INDICATOR 8 (GLOVE) ×1
GLOVE ECLIPSE 7.5 STRL STRAW (GLOVE) ×3 IMPLANT
GLOVE EXAM NITRILE LRG STRL (GLOVE) IMPLANT
GLOVE EXAM NITRILE MD LF STRL (GLOVE) IMPLANT
GLOVE EXAM NITRILE XL STR (GLOVE) ×1 IMPLANT
GLOVE EXAM NITRILE XS STR PU (GLOVE) IMPLANT
GLOVE SS BIOGEL STRL SZ 8 (GLOVE) IMPLANT
GLOVE SUPERSENSE BIOGEL SZ 8 (GLOVE) ×2
GOWN BRE IMP SLV AUR LG STRL (GOWN DISPOSABLE) ×1 IMPLANT
GOWN BRE IMP SLV AUR XL STRL (GOWN DISPOSABLE) ×3 IMPLANT
GOWN STRL REIN 2XL LVL4 (GOWN DISPOSABLE) ×1 IMPLANT
HEMOSTAT SURGICEL 2X14 (HEMOSTASIS) IMPLANT
KIT BASIN OR (CUSTOM PROCEDURE TRAY) ×2 IMPLANT
KIT INFUSE X SMALL 1.4CC (Orthopedic Implant) ×1 IMPLANT
KIT ROOM TURNOVER OR (KITS) ×2 IMPLANT
NDL SPNL 18GX3.5 QUINCKE PK (NEEDLE) ×1 IMPLANT
NDL SPNL 22GX3.5 QUINCKE BK (NEEDLE) ×2 IMPLANT
NEEDLE SPNL 18GX3.5 QUINCKE PK (NEEDLE) ×2 IMPLANT
NEEDLE SPNL 22GX3.5 QUINCKE BK (NEEDLE) ×4 IMPLANT
NS IRRIG 1000ML POUR BTL (IV SOLUTION) ×2 IMPLANT
PACK LAMINECTOMY NEURO (CUSTOM PROCEDURE TRAY) ×2 IMPLANT
PAD ARMBOARD 7.5X6 YLW CONV (MISCELLANEOUS) ×6 IMPLANT
PIN MAYFIELD SKULL DISP (PIN) ×2 IMPLANT
ROD 120MM (Rod) ×2 IMPLANT
ROD SPNL 120X3.5XNS LF TI (Rod) IMPLANT
SCREW MA MM 3.5X14 (Screw) ×6 IMPLANT
SCREW SET THREADED (Screw) ×6 IMPLANT
SPONGE GAUZE 4X4 12PLY (GAUZE/BANDAGES/DRESSINGS) IMPLANT
SPONGE LAP 4X18 X RAY DECT (DISPOSABLE) IMPLANT
SPONGE SURGIFOAM ABS GEL 100 (HEMOSTASIS) ×2 IMPLANT
STAPLER SKIN PROX WIDE 3.9 (STAPLE) ×2 IMPLANT
STRIP VITOSS 25X50X4MM (Neuro Prosthesis/Implant) ×1 IMPLANT
SUT ETHILON 3 0 FSL (SUTURE) IMPLANT
SUT VIC AB 0 CT1 18XCR BRD8 (SUTURE) ×1 IMPLANT
SUT VIC AB 0 CT1 8-18 (SUTURE) ×4
SUT VIC AB 2-0 CP2 18 (SUTURE) ×3 IMPLANT
SYR 20ML ECCENTRIC (SYRINGE) ×2 IMPLANT
TOWEL OR 17X24 6PK STRL BLUE (TOWEL DISPOSABLE) ×2 IMPLANT
TOWEL OR 17X26 10 PK STRL BLUE (TOWEL DISPOSABLE) ×2 IMPLANT
TRAY FOLEY CATH 14FRSI W/METER (CATHETERS) IMPLANT
UNDERPAD 30X30 INCONTINENT (UNDERPADS AND DIAPERS) ×2 IMPLANT
VUE POINT DRILL BIT ×1 IMPLANT
WATER STERILE IRR 1000ML POUR (IV SOLUTION) ×2 IMPLANT

## 2012-05-17 NOTE — Op Note (Signed)
05/17/2012  12:43 PM  PATIENT:  Stephanie Noble  56 y.o. female  PRE-OPERATIVE DIAGNOSIS:  C6-7 Pseudoarthrosis , Cervical spondylosis, cervical degenerative disc disease, neck pain   POST-OPERATIVE DIAGNOSIS:  C6-7 pseudoarthrosis, cervical spondylosis, cervical degenerative disc disease, neck pain  PROCEDURE:  Procedure(s): POSTERIOR CERVICAL FUSION/FORAMINOTOMY 3 LEVEL:  C6-T1 posterior cervical arthrodesis with Vuepoint posterior instrumentation, Vitoss, and infuse with C-arm fluoroscopic guidance  SURGEON:  Surgeon(s): Hewitt Shorts, MD Cristi Loron, MD  ASSISTANTS: Tressie Stalker, M.D.  ANESTHESIA:   general  EBL:  Total I/O In: 1500 [I.V.:1500] Out: 75 [Blood:75]  BLOOD ADMINISTERED:none  COUNT: Correct per nursing staff  DICTATION: Patient was brought to the operating room, placed under general endotracheal anesthesia. The 3 pin Mayfield head holder was applied, the patient was turned to a prone position. We used the radiolucent Mayfield headholder. The neck and upper back were prepped with Betadine soap and solution and draped in a sterile fashion. The midline was infiltrated with local anesthetic with epinephrine, and using C-arm fluoroscopic guidance the C6, C7, and T1 levels were identified. Midline incision was made over the C6-T1 levels, and carried down to the subcutaneous tissue. Bipolar cautery and electrocautery used to maintain hemostasis. The cervical fascia was incised bilaterally and the paracervical musculature was dissected from the spinous process lamina in a subperiosteal fashion. Self-retaining retractors were placed, and the C6, C7, and T1 spinous process, lamina, and lateral masses were identified. Using C-arm fluoroscopic guidance, and under direct visualization, entry points were identified at each level. Drill holes were started with the high-speed drill and then a screw hole in the lateral mass was made in a superior lateral and anterior  trajectory at each level. Each was examined with a ball probe, good bony surfaces were found. The posterior cortical surface was tapped at each level, and then we placed 3.5 x 14 mm polyaxial screws at each level. At T1 we initially anticipated placing pedicle screws, however the patient had substantial lateral masses, and therefore lateral mass screws were used, and had good bony purchase and were quite secure. We then cut 120 mm rod to a 40 mm and 45 mm paired rods, each of those was contoured with a rod bender, and placed within the screw heads. Locking caps were placed. Once all 6 locking caps were in place final tightening was performed against a counter torque. The lamina and spinous processes, and the C6-7 and C7-T1 facet joints were cleaned of soft tissue and decorticated with the high-speed drill, and then we packed a combination of infuse and Vitoss over the lamina and spinous processes, and into the facet joints. The wound had been irrigated numerous times the procedure with bacitracin solution, good hemostasis confirmed, and then we proceeded with closure. Deep fascia was closed with interrupted undyed 0 Vicryl sutures, Scarpa's fascia was closed interrupted undyed 0 Vicryl sutures, and the subcutaneous and subcuticular closed with interrupted inverted 2-0 Vicryl sutures. Skin is approximate Dermabond. Following surgery the patient was turned back in supine position the 3 pin Mayfield head holder was removed, and the patient is to be reversed and the anesthetic, extubated, and transferred to recovery room for further care.   PLAN OF CARE: Admit for overnight observation  PATIENT DISPOSITION:  PACU - hemodynamically stable.   Delay start of Pharmacological VTE agent (>24hrs) due to surgical blood loss or risk of bleeding:  yes

## 2012-05-17 NOTE — Anesthesia Postprocedure Evaluation (Signed)
Anesthesia Post Note  Patient: Stephanie Noble  Procedure(s) Performed: Procedure(s) (LRB): POSTERIOR CERVICAL FUSION/FORAMINOTOMY LEVEL 2 (N/A)  Anesthesia type: General  Patient location: PACU  Post pain: Pain level controlled and Adequate analgesia  Post assessment: Post-op Vital signs reviewed, Patient's Cardiovascular Status Stable, Respiratory Function Stable, Patent Airway and Pain level controlled  Last Vitals:  Filed Vitals:   05/17/12 1255  BP: 113/62  Pulse: 69  Temp: 36.4 C  Resp: 20    Post vital signs: Reviewed and stable  Level of consciousness: awake, alert  and oriented  Complications: No apparent anesthesia complications

## 2012-05-17 NOTE — Progress Notes (Signed)
Filed Vitals:   05/17/12 1410 05/17/12 1415 05/17/12 1430 05/17/12 1636  BP: 105/61  108/71 103/67  Pulse: 60 74 69 71  Temp:  97.7 F (36.5 C) 97.4 F (36.3 C) 97.8 F (36.6 C)  TempSrc:      Resp: 9 14 16 16   SpO2: 100% 100% 98% 97%    Patient becoming more comfortable. Having some left lumbar radicular pain to the medial arm forearm. Wound clean and dry. Has ambulated, but encouraged to ambulate more.  Plan: Continue progress the postoperative recovery.  Hewitt Shorts, MD 05/17/2012, 7:40 PM

## 2012-05-17 NOTE — Anesthesia Preprocedure Evaluation (Signed)
Anesthesia Evaluation  Patient identified by MRN, date of birth, ID band Patient awake    Reviewed: Allergy & Precautions, H&P , NPO status , Patient's Chart, lab work & pertinent test results  History of Anesthesia Complications (+) PONV  Airway Mallampati: II  Neck ROM: full    Dental   Pulmonary asthma ,          Cardiovascular     Neuro/Psych    GI/Hepatic   Endo/Other    Renal/GU      Musculoskeletal  (+) Arthritis -,   Abdominal   Peds  Hematology   Anesthesia Other Findings   Reproductive/Obstetrics                           Anesthesia Physical Anesthesia Plan  ASA: II  Anesthesia Plan: General   Post-op Pain Management:    Induction: Intravenous  Airway Management Planned: Oral ETT  Additional Equipment:   Intra-op Plan:   Post-operative Plan: Extubation in OR  Informed Consent: I have reviewed the patients History and Physical, chart, labs and discussed the procedure including the risks, benefits and alternatives for the proposed anesthesia with the patient or authorized representative who has indicated his/her understanding and acceptance.     Plan Discussed with: CRNA and Surgeon  Anesthesia Plan Comments:         Anesthesia Quick Evaluation

## 2012-05-17 NOTE — Transfer of Care (Signed)
Immediate Anesthesia Transfer of Care Note  Patient: Stephanie Noble  Procedure(s) Performed: Procedure(s) (LRB) with comments: POSTERIOR CERVICAL FUSION/FORAMINOTOMY LEVEL 2 (N/A) - cervical six to Thoracic One posterior cervical arthrodesis with instrumentation  Patient Location: PACU  Anesthesia Type:General  Level of Consciousness: sedated and patient cooperative  Airway & Oxygen Therapy: Patient Spontanous Breathing and Patient connected to nasal cannula oxygen  Post-op Assessment: Report given to PACU RN, Post -op Vital signs reviewed and stable and Patient moving all extremities X 4  Post vital signs: Reviewed and stable  Complications: No apparent anesthesia complications

## 2012-05-17 NOTE — H&P (Signed)
Subjective: Patient is a 56 y.o. 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 Patient is 13 months status post 3 level C4-5, C5-6, and C6-7 ACDF. She's had good healing of the fusions at C4-5 and C5-6, but has developed a nonunion and pseudoarthrosis at C6-7 with significant interspinous motion to flexion extension, and increasing hypermobility at C7-T1. She is admitted now for a C6-T1 posterior cervical arthrodesis with posterior instrumentation and bone graft.    Past Medical History  Diagnosis Date  . Complication of anesthesia   . PONV (postoperative nausea and vomiting)   . Asthma     uses albuterol as needed "mostly weather induced"  . Pneumonia     hx of pneumonia and bronchitis  . Chronic neck pain   . Chronic back pain   . Scoliosis   . Degenerative disc disease     cervical and lumbar area    Past Surgical History  Procedure Date  . Spine surgery     x 5  . Knee surgery   . Cholecystectomy   . Shoulder surgery     right  . Dilation and curettage of uterus     x 2    Prescriptions prior to admission  Medication Sig Dispense Refill  . albuterol (PROVENTIL HFA;VENTOLIN HFA) 108 (90 BASE) MCG/ACT inhaler Inhale 2 puffs into the lungs every 6 (six) hours as needed. For shortness of breath      . calcium carbonate (OS-CAL) 600 MG TABS Take 600 mg by mouth 2 (two) times daily with a meal.      . HYDROcodone-acetaminophen (NORCO/VICODIN) 5-325 MG per tablet Take 0.5-1 tablets by mouth 3 (three) times daily as needed. For pain      . meloxicam (MOBIC) 15 MG tablet Take 15 mg by mouth daily.      Marland Kitchen zolpidem (AMBIEN) 10 MG tablet Take 5-10 mg by mouth at bedtime as needed. For sleep       No Known Allergies  History  Substance Use Topics  . Smoking status: Never Smoker   . Smokeless tobacco: Not on file  . Alcohol Use: No    History reviewed. No pertinent family history.   Review of Systems A  comprehensive review of systems was negative.  Objective: Vital signs in last 24 hours: Temp:  [98.4 F (36.9 C)] 98.4 F (36.9 C) (11/18 0717) Pulse Rate:  [66] 66  (11/18 0717) Resp:  [18] 18  (11/18 0717) BP: (136)/(91) 136/91 mmHg (11/18 0717) SpO2:  [100 %] 100 % (11/18 0717)  EXAM:  Well-developed well-nourished white female in no acute distress.  Lungs are clear to auscultation , the patient has symmetrical respiratory excursion. Heart has a regular rate and rhythm normal S1 and S2 no murmur.   Abdomen is soft nontender nondistended bowel sounds are present. Extremity examination shows no clubbing cyanosis or edema. Motor examination shows 5 over 5 strength in the upper extremities including the deltoid biceps triceps and intrinsics and grip. Sensation is intact to pinprick throughout the digits of the upper extremities. Reflexes are symmetrical and without evidence of pathologic reflexes. Patient has a normal gait and stance.    Data Review:CBC    Component Value Date/Time   WBC 5.9 05/10/2012 1245   RBC 4.79 05/10/2012 1245   HGB 14.5 05/10/2012 1245   HCT 41.4 05/10/2012 1245   PLT 199 05/10/2012 1245   MCV 86.4 05/10/2012 1245   MCH 30.3  05/10/2012 1245   MCHC 35.0 05/10/2012 1245   RDW 12.2 05/10/2012 1245   LYMPHSABS 1.2 05/23/2008 1830   MONOABS 0.4 05/23/2008 1830   EOSABS 0.0 05/23/2008 1830   BASOSABS 0.0 05/23/2008 1830                          BMET    Component Value Date/Time   NA 133* 05/23/2008 1830   K 3.6 05/23/2008 1830   CL 104 05/23/2008 1830   CO2 25 05/23/2008 1830   GLUCOSE 102* 05/23/2008 1830   BUN 10 05/23/2008 1830   CREATININE 0.76 05/23/2008 1830   CALCIUM 8.5 05/23/2008 1830   GFRNONAA >60 05/23/2008 1830   GFRAA  Value: >60        The eGFR has been calculated using the MDRD equation. This calculation has not been validated in all clinical 05/23/2008 1830     Assessment/Plan: Patient with posterior neck pain secondary to  nonunion and pseudoarthrosis at C6-7 an increasing degeneration at the C7-T1 level who is admitted for C6-T1 posterior cervical arthrodesis with posterior instrumentation and bone graft.  I've discussed with the patient the nature of his condition, the nature the surgical procedure, the typical length of surgery, hospital stay, and overall recuperation. We discussed limitations postoperatively. I discussed risks of surgery including risks of infection, bleeding, possibly need for transfusion, the risk of nerve root dysfunction with pain, weakness, numbness, or paresthesias, the risk of spinal cord dysfunction with paralysis of all 4 limbs and quadriplegia, and the risk of dural tear and CSF leakage and possible need for further surgery, the risk of failure of the arthrodesis and the possible need for further surgery, and the risk of anesthetic complications including myocardial infarction, stroke, pneumonia, and death. We also discussed the need for postoperative immobilization in a cervical collar. Understanding all this the patient does wish to proceed with surgery and is admitted for such.    Hewitt Shorts, MD 05/17/2012 7:53 AM

## 2012-05-18 ENCOUNTER — Encounter (HOSPITAL_COMMUNITY): Payer: Self-pay | Admitting: Neurosurgery

## 2012-05-18 NOTE — Progress Notes (Signed)
Filed Vitals:   05/17/12 2000 05/17/12 2330 05/18/12 0400 05/18/12 0738  BP: 97/61 119/72 98/65 95/60   Pulse: 64 62 85 67  Temp: 97.8 F (36.6 C) 97.7 F (36.5 C) 97.7 F (36.5 C) 98.3 F (36.8 C)  TempSrc:      Resp: 16 16 18 16   SpO2: 95% 95% 95% 97%    Patient's more comfortable, radicular discomfort through left upper extremity diminishing. Increasing ambulation.  Plan: With extent of discomfort, we'll continue IV acetaminophen and Toradol, and have encourage further ambulation.  Hewitt Shorts, MD 05/18/2012, 7:50 AM

## 2012-05-19 MED ORDER — ZOLPIDEM TARTRATE 10 MG PO TABS: 10.0000 mg | ORAL_TABLET | Freq: Every evening | ORAL | Status: DC | PRN

## 2012-05-19 MED ORDER — OXYCODONE-ACETAMINOPHEN 5-325 MG PO TABS: 1.0000 | ORAL_TABLET | ORAL | Status: DC | PRN

## 2012-05-19 MED ORDER — CYCLOBENZAPRINE HCL 10 MG PO TABS: 10.0000 mg | ORAL_TABLET | Freq: Three times a day (TID) | ORAL | Status: DC | PRN

## 2012-05-19 MED ORDER — ZOLPIDEM TARTRATE 5 MG PO TABS: 10.0000 mg | ORAL_TABLET | Freq: Every evening | ORAL | Status: DC | PRN

## 2012-05-19 NOTE — Discharge Summary (Signed)
Physician Discharge Summary  Patient ID: Stephanie Noble MRN: [id number] DOB/AGE: 06/30/1956 56 y.o.  Admit date: 05/17/2012 Discharge date: 05/19/2012  Admission Diagnoses:  C6-7 pseudoarthrosis, cervical spondylosis, cervical degenerative disease, cervicalgia  Discharge Diagnoses:   C6-7 pseudoarthrosis, cervical spondylosis, cervical degenerative disease, cervicalgia  Discharged Condition: good  Hospital Course: Patient was admitted underwent a C6-T1 posterior cervical arthrodesis with viewpoint posterior instrumentation, Vitoss, and infuse. Postoperatively she had moderate discomfort has gradually eased. She's been able to be up and ambulating in the halls. Her wound is healing nicely. She is to return for followup with me in about 3 weeks. She has been given instructions regarding wound care and activities following discharge. Her preoperative EKG did show a new incomplete right bundle branch block, and she's been advised to followup with her primary physician to review that in the next week or 2, she was given a copy of her EKG printout to take with her to that appointment.  Discharge Exam: Blood pressure 120/78, pulse 92, temperature 99.8 F (37.7 C), temperature source Oral, resp. rate 16, SpO2 92.00%.  Disposition: Home     Medication List     As of 05/19/2012  2:27 PM    TAKE these medications         albuterol 108 (90 BASE) MCG/ACT inhaler   Commonly known as: PROVENTIL HFA;VENTOLIN HFA   Inhale 2 puffs into the lungs every 6 (six) hours as needed. For shortness of breath      calcium carbonate 600 MG Tabs   Commonly known as: OS-CAL   Take 600 mg by mouth 2 (two) times daily with a meal.      cyclobenzaprine 10 MG tablet   Commonly known as: FLEXERIL   Take 1 tablet (10 mg total) by mouth 3 (three) times daily as needed for muscle spasms.      HYDROcodone-acetaminophen 5-325 MG per tablet   Commonly known as: NORCO/VICODIN   Take 0.5-1 tablets by mouth 3  (three) times daily as needed. For pain      meloxicam 15 MG tablet   Commonly known as: MOBIC   Take 15 mg by mouth daily.      oxyCODONE-acetaminophen 5-325 MG per tablet   Commonly known as: PERCOCET/ROXICET   Take 1-2 tablets by mouth every 4 (four) hours as needed for pain.      zolpidem 10 MG tablet   Commonly known as: AMBIEN   Take 5-10 mg by mouth at bedtime as needed. For sleep      zolpidem 10 MG tablet   Commonly known as: AMBIEN   Take 1 tablet (10 mg total) by mouth at bedtime as needed for sleep.         Signed: Hewitt Shorts, MD 05/19/2012, 2:27 PM

## 2012-05-19 NOTE — Progress Notes (Signed)
Pt given D/C instructions with Rx's, verbal understanding given. Pt D/C'd home via wheelchair @ 1450 per MD order. Rema Fendt, RN

## 2013-01-03 ENCOUNTER — Encounter: Payer: Self-pay | Admitting: *Deleted

## 2013-01-04 ENCOUNTER — Encounter: Payer: Self-pay | Admitting: Family Medicine

## 2013-01-04 ENCOUNTER — Ambulatory Visit (INDEPENDENT_AMBULATORY_CARE_PROVIDER_SITE_OTHER): Payer: BC Managed Care – PPO | Admitting: Family Medicine

## 2013-01-04 VITALS — BP 100/80 | HR 70 | Wt 168.5 lb

## 2013-01-04 DIAGNOSIS — G4700 Insomnia, unspecified: Secondary | ICD-10-CM

## 2013-01-04 DIAGNOSIS — M1990 Unspecified osteoarthritis, unspecified site: Secondary | ICD-10-CM | POA: Insufficient documentation

## 2013-01-04 DIAGNOSIS — M129 Arthropathy, unspecified: Secondary | ICD-10-CM

## 2013-01-04 MED ORDER — CELECOXIB 100 MG PO CAPS: 100.0000 mg | ORAL_CAPSULE | Freq: Every day | ORAL | Status: AC

## 2013-01-04 MED ORDER — ALBUTEROL SULFATE HFA 108 (90 BASE) MCG/ACT IN AERS: 2.0000 | INHALATION_SPRAY | Freq: Four times a day (QID) | RESPIRATORY_TRACT | Status: DC | PRN

## 2013-01-04 NOTE — Progress Notes (Signed)
  Subjective:    Patient ID: Stephanie Noble, female    DOB: 1955/11/19, 57 y.o.   MRN: [id number]  HPI Choking sensation at times. Neck fusions times two. Due to see a nose and throat Dr. to  Had to take second post pain.  Ongoing neck discomfort despite surgery x2. Also reports back pain also reports joint pain. Took Celebrex in the past and had very significant success.  Patient brings in an EKG she was advised to discuss this with her primary care physician. No chest pain no syncope no shortness of breath.  Patient reports lifelong difficulty sleeping worse in recent years. Has been using parts of her husbands Ambien with quite a bit of success would like a prescription. Exercising some but not a lot.     Review of Systems ROS otherwise negative    Objective:   Physical Exam Alert lungs clear. Mild malaise. Vital stable. HEENT normal. Neck supple. Some pain with rotation. Hands some Heberden's nodes. Knees some crepitations.       Assessment & Plan:  #1 insomnia discussed at length. #2 reactive airways/exercise asthma clinically stable. #3) though partial right bundle branch block discussed. Benign. #4 arthritis ongoing and severe. Plan Celebrex when necessary to use as needed. Ventolin refilled. Ambien 5 mg one half to one each bedtime exercise discussed easily 25 minutes spent most in discussion check every 6 months WSL

## 2013-04-08 ENCOUNTER — Other Ambulatory Visit: Payer: Self-pay | Admitting: Neurosurgery

## 2013-04-08 DIAGNOSIS — IMO0002 Reserved for concepts with insufficient information to code with codable children: Secondary | ICD-10-CM

## 2013-04-12 ENCOUNTER — Ambulatory Visit
Admission: RE | Admit: 2013-04-12 | Discharge: 2013-04-12 | Disposition: A | Discharge status: Home / Self Care | Payer: BC Managed Care – PPO | Source: Ambulatory Visit | Attending: Neurosurgery | Admitting: Neurosurgery

## 2013-04-12 DIAGNOSIS — IMO0002 Reserved for concepts with insufficient information to code with codable children: Secondary | ICD-10-CM

## 2013-09-19 ENCOUNTER — Other Ambulatory Visit: Payer: Self-pay | Admitting: Family Medicine

## 2013-09-19 NOTE — Telephone Encounter (Signed)
Last seen 12/2012

## 2013-09-19 NOTE — Telephone Encounter (Signed)
Ok plus five mo worth 

## 2013-10-05 ENCOUNTER — Ambulatory Visit (INDEPENDENT_AMBULATORY_CARE_PROVIDER_SITE_OTHER): Payer: BC Managed Care – PPO | Admitting: Family Medicine

## 2013-10-05 ENCOUNTER — Telehealth: Payer: Self-pay | Admitting: Family Medicine

## 2013-10-05 ENCOUNTER — Encounter: Payer: Self-pay | Admitting: Family Medicine

## 2013-10-05 VITALS — BP 110/72 | Temp 99.3°F | Ht 66.0 in | Wt 176.8 lb

## 2013-10-05 DIAGNOSIS — J209 Acute bronchitis, unspecified: Secondary | ICD-10-CM

## 2013-10-05 DIAGNOSIS — J45909 Unspecified asthma, uncomplicated: Secondary | ICD-10-CM

## 2013-10-05 DIAGNOSIS — J683 Other acute and subacute respiratory conditions due to chemicals, gases, fumes and vapors: Secondary | ICD-10-CM

## 2013-10-05 MED ORDER — PREDNISONE 20 MG PO TABS: ORAL_TABLET | ORAL | Status: DC

## 2013-10-05 MED ORDER — ALBUTEROL SULFATE (2.5 MG/3ML) 0.083% IN NEBU: 2.5000 mg | INHALATION_SOLUTION | RESPIRATORY_TRACT | Status: DC | PRN

## 2013-10-05 MED ORDER — LEVOFLOXACIN 500 MG PO TABS: 500.0000 mg | ORAL_TABLET | Freq: Every day | ORAL | Status: DC

## 2013-10-05 NOTE — Telephone Encounter (Signed)
Patient transferred to front desk to schedule appointment for today.  

## 2013-10-05 NOTE — Progress Notes (Signed)
   Subjective:    Patient ID: Davene CostainJennifer L Hedgecock, female    DOB: 08/03/1955, 58 y.o.   MRN: [id number]  Cough This is a new problem. The current episode started in the past 7 days. Associated symptoms include a fever, nasal congestion and wheezing. Treatments tried: albuterol and mucinex.   outsie easter  Started wheezing  Headache and tired  Used top of the head  No more achiness than usual  No one else sick  No sig spring allergies    Review of Systems  Constitutional: Positive for fever.  Respiratory: Positive for cough and wheezing.        Objective:   Physical Exam  Alert moderate malaise slight is reviewed hydration good to 99 HEENT moderate nasal congestion pharynx normal lungs bilateral wheezes mild no tachypnea no crackles impression bronchitis with exacerbation of reactive airways plan Levaquin daily 10 days. Prednisone taper. Albuterol via nebulizer 4 times a day warning signs discussed.      Assessment & Plan:  See above

## 2013-10-05 NOTE — Telephone Encounter (Signed)
Patient is dealing with upper resp. Issues right now and used her nebulizer at lunch and had relief for about an hour. She is wanting to know when she can use her nebulizer again?

## 2013-10-07 ENCOUNTER — Telehealth: Payer: Self-pay | Admitting: Family Medicine

## 2013-10-07 NOTE — Telephone Encounter (Signed)
Patient has been taking her medication and treatment as suggested after being diagnosed with bronchitis. She feels a little better, but still having sporadic coughing spells. Would like to know when she is expected to see a major difference.

## 2013-10-07 NOTE — Telephone Encounter (Signed)
Notified husband it usually takes at least 2-3 days to see a difference in wheezing with Prednisone. The cough should IMPROVE but may persist for 7-10 days or more after bronchitis even after antibiotics. Husband verbalized understanding.

## 2013-10-07 NOTE — Telephone Encounter (Signed)
It usually takes at least 2-3 days to see a difference in wheezing with Prednisone. The cough should IMPROVE but may persist for 7-10 days or more after bronchitis even after antibiotics.

## 2013-10-07 NOTE — Telephone Encounter (Signed)
Patient was given Levaquin, Prednisone and Ventolin on 10/05/13 in pm-on medicine for 24 hrs feeling some better but still having coughing spells and wants to know when that should improve.

## 2013-12-08 ENCOUNTER — Telehealth: Payer: Self-pay | Admitting: Family Medicine

## 2013-12-08 NOTE — Telephone Encounter (Signed)
error 

## 2014-03-24 ENCOUNTER — Other Ambulatory Visit: Payer: Self-pay | Admitting: Family Medicine

## 2014-03-24 NOTE — Telephone Encounter (Signed)
Ok plus five monthly ref 

## 2014-03-24 NOTE — Telephone Encounter (Signed)
Last seen 10/05/13

## 2014-03-27 ENCOUNTER — Other Ambulatory Visit: Payer: Self-pay | Admitting: Family Medicine

## 2014-03-27 NOTE — Telephone Encounter (Signed)
Last seen 10/05/13

## 2014-03-27 NOTE — Telephone Encounter (Signed)
Ok 6 mo worth 

## 2014-04-26 DIAGNOSIS — R1310 Dysphagia, unspecified: Secondary | ICD-10-CM | POA: Insufficient documentation

## 2014-06-30 DIAGNOSIS — Z8709 Personal history of other diseases of the respiratory system: Secondary | ICD-10-CM

## 2014-06-30 HISTORY — DX: Personal history of other diseases of the respiratory system: Z87.09

## 2014-08-01 ENCOUNTER — Encounter: Payer: Self-pay | Admitting: Family Medicine

## 2014-08-01 ENCOUNTER — Ambulatory Visit (INDEPENDENT_AMBULATORY_CARE_PROVIDER_SITE_OTHER): Payer: BLUE CROSS/BLUE SHIELD | Admitting: Family Medicine

## 2014-08-01 VITALS — BP 120/88 | Temp 98.3°F | Ht 66.0 in | Wt 169.5 lb

## 2014-08-01 DIAGNOSIS — J019 Acute sinusitis, unspecified: Secondary | ICD-10-CM

## 2014-08-01 DIAGNOSIS — B9689 Other specified bacterial agents as the cause of diseases classified elsewhere: Secondary | ICD-10-CM

## 2014-08-01 DIAGNOSIS — J208 Acute bronchitis due to other specified organisms: Secondary | ICD-10-CM

## 2014-08-01 MED ORDER — PREDNISONE 20 MG PO TABS: ORAL_TABLET | ORAL | Status: DC

## 2014-08-01 MED ORDER — ALBUTEROL SULFATE HFA 108 (90 BASE) MCG/ACT IN AERS: 2.0000 | INHALATION_SPRAY | Freq: Four times a day (QID) | RESPIRATORY_TRACT | Status: DC | PRN

## 2014-08-01 MED ORDER — LEVOFLOXACIN 500 MG PO TABS: 500.0000 mg | ORAL_TABLET | Freq: Every day | ORAL | Status: DC

## 2014-08-01 MED ORDER — ALBUTEROL SULFATE (2.5 MG/3ML) 0.083% IN NEBU: 2.5000 mg | INHALATION_SOLUTION | RESPIRATORY_TRACT | Status: DC | PRN

## 2014-08-01 NOTE — Progress Notes (Signed)
   Subjective:    Patient ID: Davene CostainJennifer L Fenech, female    DOB: 03/30/1956, 59 y.o.   MRN: [id number]  Sinusitis This is a new problem. The current episode started in the past 7 days. The problem is unchanged. The maximum temperature recorded prior to her arrival was 100.4 - 100.9 F. The fever has been present for 1 to 2 days. The pain is moderate. Associated symptoms include coughing, headaches and sinus pressure. Past treatments include oral decongestants. The treatment provided no relief.   Patient states that she has no other concerns at this time.    Review of Systems  HENT: Positive for sinus pressure.   Respiratory: Positive for cough.   Neurological: Positive for headaches.   She relates head congestion sinus pressure postnasal drip cough congestion she states most of the time when she gets this a goes into her chest.    Objective:   Physical Exam eardrums normal moderate sinus tenderness throat is normal neck is supple lungs are clear no crackles heart is regular pulse normal       Assessment & Plan:  #1 sinusitis antibiotics prescribed warning signs discussed follow-up if ongoing trouble  #2 highly encourage patient to get a colonoscopy. She will try to get it done this year.  Albuterol when necessary if chest symptoms trigger asthma flareup then recommend prednisone taper she understands this she also understands to follow-up if high fevers difficulty breathing or worse

## 2014-08-04 ENCOUNTER — Other Ambulatory Visit: Payer: Self-pay | Admitting: *Deleted

## 2014-08-04 ENCOUNTER — Telehealth: Payer: Self-pay | Admitting: Family Medicine

## 2014-08-04 MED ORDER — HYDROCODONE-HOMATROPINE 5-1.5 MG/5ML PO SYRP: 5.0000 mL | ORAL_SOLUTION | Freq: Every evening | ORAL | Status: DC | PRN

## 2014-08-04 NOTE — Telephone Encounter (Signed)
HYCODAN 4 OZ ONE TSPN QHS PRN COUGH

## 2014-08-04 NOTE — Telephone Encounter (Signed)
Pt was seen on Tues 08/01/14 and is requesting something to help her sleep Pt states that due to the bronchitis she is unable to sleep due to coughing and She is keeping everyone awake.  [facility] [address]

## 2014-08-04 NOTE — Telephone Encounter (Signed)
Script ready. Pt notified.  

## 2014-09-25 ENCOUNTER — Other Ambulatory Visit: Payer: Self-pay | Admitting: Family Medicine

## 2014-09-25 NOTE — Telephone Encounter (Signed)
Ok this and 3 refills 

## 2014-12-28 ENCOUNTER — Other Ambulatory Visit: Payer: Self-pay | Admitting: Family Medicine

## 2014-12-28 NOTE — Telephone Encounter (Signed)
Ok 6 mo worth 

## 2015-01-02 ENCOUNTER — Other Ambulatory Visit: Payer: Self-pay | Admitting: Family Medicine

## 2015-01-02 NOTE — Telephone Encounter (Signed)
Ok 6 mo worth 

## 2015-05-16 ENCOUNTER — Encounter: Payer: Self-pay | Admitting: Family Medicine

## 2015-05-16 ENCOUNTER — Ambulatory Visit (INDEPENDENT_AMBULATORY_CARE_PROVIDER_SITE_OTHER): Payer: BLUE CROSS/BLUE SHIELD | Admitting: Family Medicine

## 2015-05-16 VITALS — BP 130/78 | Temp 97.8°F | Ht 66.0 in | Wt 174.0 lb

## 2015-05-16 DIAGNOSIS — J019 Acute sinusitis, unspecified: Secondary | ICD-10-CM | POA: Diagnosis not present

## 2015-05-16 DIAGNOSIS — J683 Other acute and subacute respiratory conditions due to chemicals, gases, fumes and vapors: Secondary | ICD-10-CM

## 2015-05-16 DIAGNOSIS — B9689 Other specified bacterial agents as the cause of diseases classified elsewhere: Secondary | ICD-10-CM

## 2015-05-16 DIAGNOSIS — J4520 Mild intermittent asthma, uncomplicated: Secondary | ICD-10-CM | POA: Diagnosis not present

## 2015-05-16 MED ORDER — ZOLPIDEM TARTRATE 5 MG PO TABS: ORAL_TABLET | ORAL | Status: DC

## 2015-05-16 MED ORDER — LEVOFLOXACIN 500 MG PO TABS: 500.0000 mg | ORAL_TABLET | Freq: Every day | ORAL | Status: DC

## 2015-05-16 MED ORDER — PREDNISONE 20 MG PO TABS: ORAL_TABLET | ORAL | Status: DC

## 2015-05-16 NOTE — Progress Notes (Signed)
   Subjective:    Patient ID: Davene CostainJennifer L Bale, female    DOB: 12/30/1955, 59 y.o.   MRN: [id number]  Cough This is a new problem. The current episode started 1 to 4 weeks ago. Associated symptoms include headaches. Treatments tried: mucinex, nebulizer.   Three wk ago got into cold air and cough and cog   Res inh not helping  uss neb two or three itmes per day   Off and on productive Frontal h a   Review of Systems  Respiratory: Positive for cough.   Neurological: Positive for headaches.       Objective:   Physical Exam  Alert mild malaise frontal maxillary tenderness pharynx T Supple lungs mild wheeze heart rare rhythm.      Assessment & Plan:  Impression subacute rhinosinusitis/bronchitis with element of reactive airways plan albuterol when necessary. Antibiotics prescribed. Symptom care discussed warning signs prednisone taper also. Changing ambient 90 day therapy discussed WSL

## 2015-06-11 ENCOUNTER — Other Ambulatory Visit (HOSPITAL_COMMUNITY): Payer: Self-pay | Admitting: Obstetrics and Gynecology

## 2015-06-11 DIAGNOSIS — Z1231 Encounter for screening mammogram for malignant neoplasm of breast: Secondary | ICD-10-CM

## 2015-06-14 ENCOUNTER — Ambulatory Visit (HOSPITAL_COMMUNITY)
Admission: RE | Admit: 2015-06-14 | Discharge: 2015-06-14 | Disposition: A | Discharge status: Home / Self Care | Payer: BLUE CROSS/BLUE SHIELD | Source: Ambulatory Visit | Attending: Obstetrics and Gynecology | Admitting: Obstetrics and Gynecology

## 2015-06-14 DIAGNOSIS — Z1231 Encounter for screening mammogram for malignant neoplasm of breast: Secondary | ICD-10-CM | POA: Diagnosis not present

## 2015-06-14 DIAGNOSIS — R928 Other abnormal and inconclusive findings on diagnostic imaging of breast: Secondary | ICD-10-CM | POA: Insufficient documentation

## 2015-06-18 ENCOUNTER — Other Ambulatory Visit (HOSPITAL_COMMUNITY): Payer: Self-pay | Admitting: Obstetrics and Gynecology

## 2015-06-18 ENCOUNTER — Other Ambulatory Visit: Payer: Self-pay | Admitting: Obstetrics and Gynecology

## 2015-06-18 DIAGNOSIS — R928 Other abnormal and inconclusive findings on diagnostic imaging of breast: Secondary | ICD-10-CM

## 2015-06-18 DIAGNOSIS — N6489 Other specified disorders of breast: Secondary | ICD-10-CM

## 2015-06-19 ENCOUNTER — Ambulatory Visit (HOSPITAL_COMMUNITY)
Admission: RE | Admit: 2015-06-19 | Discharge: 2015-06-19 | Disposition: A | Discharge status: Home / Self Care | Payer: BLUE CROSS/BLUE SHIELD | Source: Ambulatory Visit | Attending: Obstetrics and Gynecology | Admitting: Obstetrics and Gynecology

## 2015-06-19 DIAGNOSIS — R928 Other abnormal and inconclusive findings on diagnostic imaging of breast: Secondary | ICD-10-CM | POA: Diagnosis present

## 2015-06-21 ENCOUNTER — Other Ambulatory Visit: Payer: Self-pay | Admitting: Otolaryngology

## 2015-06-21 ENCOUNTER — Encounter: Payer: Self-pay | Admitting: Family Medicine

## 2015-06-21 ENCOUNTER — Ambulatory Visit (INDEPENDENT_AMBULATORY_CARE_PROVIDER_SITE_OTHER): Payer: BLUE CROSS/BLUE SHIELD | Admitting: Family Medicine

## 2015-06-21 VITALS — BP 112/80 | Ht 66.0 in | Wt 174.0 lb

## 2015-06-21 DIAGNOSIS — R070 Pain in throat: Secondary | ICD-10-CM

## 2015-06-21 DIAGNOSIS — M542 Cervicalgia: Secondary | ICD-10-CM | POA: Diagnosis not present

## 2015-06-21 DIAGNOSIS — G8929 Other chronic pain: Secondary | ICD-10-CM | POA: Diagnosis not present

## 2015-06-21 DIAGNOSIS — G4700 Insomnia, unspecified: Secondary | ICD-10-CM | POA: Diagnosis not present

## 2015-06-21 MED ORDER — HYDROCODONE-ACETAMINOPHEN 5-325 MG PO TABS: ORAL_TABLET | ORAL | Status: DC

## 2015-06-21 NOTE — Progress Notes (Signed)
   Subjective:    Patient ID: Stephanie CostainJennifer L Noble, female    DOB: 01/30/1956, 59 y.o.   MRN: [id number] Patient arrives office for protracted discussion of several concerns. HPIBAck pain for years. Taking aleve and methocarbamol. Some nights she does not get any sleep due to pain.   Muscle spasm meds and aleave helps abit , 2013 and 2014, neursosurg  Dr Newell Coralnudelman, states pt is having ongoing challenges, but surgically is not a candidate right now.  Pt has choking sensation, positional in nature, not dificulty with swallowing, pt does not think ist is reflux. Admits to ENT workup at this time.  Pt has severe aching at times, deep ache, acompanied by radiation to left arm at times primarily in the neck. Has been told nonsurgical at this point., severe in nature    Takes ambien 5mg  but it is not helping with sleep. Sleeps between 3-6 hours a night.   Review of Systems No headache no loss of consciousness no seizures no sore throat no change in bowel habits ROS otherwise negative    Objective:   Physical Exam  Alert vitals stable. Lungs clear. Heart rare rhythm significant limitation of neck flexion and lateral rotation. Distal arm strength sensation intact. Abdomen benign.      Assessment & Plan:  Impression 1 chronic neck pain. Patient's specialists is not thrilled with maintaining when necessary hydrocodone. He would prefer of course her family doctor did not. Very long discussion held about new realities with pain medicines and oversight. #2 choking sensation and minutes workup via ENT of note it is positional so I agree likely not reflux #3 insomnia suboptimum patient to try increased Ambien 10 if works will call back plan hydrocodone proper use discussed. [place] when necessary. Exercises discussed. Easily 25 minutes spent most in discussion of this complex presentation WSL

## 2015-06-22 DIAGNOSIS — G8929 Other chronic pain: Secondary | ICD-10-CM | POA: Insufficient documentation

## 2015-06-22 DIAGNOSIS — M542 Cervicalgia: Secondary | ICD-10-CM

## 2015-06-22 DIAGNOSIS — M4722 Other spondylosis with radiculopathy, cervical region: Secondary | ICD-10-CM | POA: Insufficient documentation

## 2015-06-26 ENCOUNTER — Encounter (HOSPITAL_COMMUNITY): Payer: BLUE CROSS/BLUE SHIELD

## 2015-06-26 ENCOUNTER — Ambulatory Visit
Admission: RE | Admit: 2015-06-26 | Discharge: 2015-06-26 | Disposition: A | Discharge status: Home / Self Care | Payer: BLUE CROSS/BLUE SHIELD | Source: Ambulatory Visit | Attending: Otolaryngology | Admitting: Otolaryngology

## 2015-06-26 DIAGNOSIS — R070 Pain in throat: Secondary | ICD-10-CM

## 2015-07-27 ENCOUNTER — Other Ambulatory Visit (HOSPITAL_COMMUNITY): Payer: BLUE CROSS/BLUE SHIELD

## 2015-08-15 ENCOUNTER — Other Ambulatory Visit: Payer: Self-pay | Admitting: Orthopedic Surgery

## 2015-08-15 DIAGNOSIS — M545 Low back pain: Secondary | ICD-10-CM

## 2015-08-17 ENCOUNTER — Ambulatory Visit
Admission: RE | Admit: 2015-08-17 | Discharge: 2015-08-17 | Disposition: A | Discharge status: Home / Self Care | Payer: BLUE CROSS/BLUE SHIELD | Source: Ambulatory Visit | Attending: Orthopedic Surgery | Admitting: Orthopedic Surgery

## 2015-08-17 DIAGNOSIS — M545 Low back pain: Secondary | ICD-10-CM

## 2015-08-17 MED ORDER — GADOBENATE DIMEGLUMINE 529 MG/ML IV SOLN
15.0000 mL | Freq: Once | INTRAVENOUS | Status: AC | PRN
  Administered 2015-08-17: 15 mL via INTRAVENOUS

## 2015-08-24 ENCOUNTER — Other Ambulatory Visit: Payer: Self-pay | Admitting: *Deleted

## 2015-08-24 ENCOUNTER — Telehealth: Payer: Self-pay | Admitting: Family Medicine

## 2015-08-24 MED ORDER — ZOLPIDEM TARTRATE 10 MG PO TABS: ORAL_TABLET | ORAL | Status: DC

## 2015-08-24 NOTE — Telephone Encounter (Signed)
Ok for six mo worth 

## 2015-08-24 NOTE — Telephone Encounter (Signed)
Patient is out of her Stephanie Noble, she needs Rx for Ambien 10 mg.  She was taking 5 mg, but the last time she was seen, Dr. Brett Canales told her to increase her dosage to 10.   [facility] [address]

## 2015-08-24 NOTE — Telephone Encounter (Signed)
Med sent to pharm. Pt notified.  

## 2015-08-24 NOTE — Telephone Encounter (Signed)
Await signature 

## 2015-09-07 DIAGNOSIS — M419 Scoliosis, unspecified: Secondary | ICD-10-CM | POA: Insufficient documentation

## 2015-10-09 ENCOUNTER — Other Ambulatory Visit: Payer: Self-pay | Admitting: Family Medicine

## 2015-11-09 ENCOUNTER — Other Ambulatory Visit: Payer: Self-pay | Admitting: Family Medicine

## 2015-11-09 MED ORDER — HYDROCODONE-ACETAMINOPHEN 5-325 MG PO TABS: ORAL_TABLET | ORAL | Status: DC

## 2015-11-09 NOTE — Telephone Encounter (Signed)
Patient requesting refill on hydrocodone 5/325 °

## 2015-11-09 NOTE — Telephone Encounter (Signed)
Rx up front for patient pick up. Patient notified. 

## 2015-11-09 NOTE — Telephone Encounter (Signed)
Ok times one only- needs office visit for further refills per Dr Brett CanalesSteve

## 2015-12-20 ENCOUNTER — Encounter: Payer: Self-pay | Admitting: Emergency Medicine

## 2015-12-20 ENCOUNTER — Emergency Department: Payer: BLUE CROSS/BLUE SHIELD

## 2015-12-20 ENCOUNTER — Emergency Department
Admission: EM | Admit: 2015-12-20 | Discharge: 2015-12-20 | Disposition: A | Discharge status: Home / Self Care | Payer: BLUE CROSS/BLUE SHIELD | Attending: Emergency Medicine | Admitting: Emergency Medicine

## 2015-12-20 DIAGNOSIS — Y92410 Unspecified street and highway as the place of occurrence of the external cause: Secondary | ICD-10-CM | POA: Diagnosis not present

## 2015-12-20 DIAGNOSIS — M5137 Other intervertebral disc degeneration, lumbosacral region: Secondary | ICD-10-CM

## 2015-12-20 DIAGNOSIS — M546 Pain in thoracic spine: Secondary | ICD-10-CM | POA: Diagnosis present

## 2015-12-20 DIAGNOSIS — Z7951 Long term (current) use of inhaled steroids: Secondary | ICD-10-CM | POA: Insufficient documentation

## 2015-12-20 DIAGNOSIS — Y939 Activity, unspecified: Secondary | ICD-10-CM | POA: Insufficient documentation

## 2015-12-20 DIAGNOSIS — J45909 Unspecified asthma, uncomplicated: Secondary | ICD-10-CM | POA: Diagnosis not present

## 2015-12-20 DIAGNOSIS — M5030 Other cervical disc degeneration, unspecified cervical region: Secondary | ICD-10-CM | POA: Diagnosis not present

## 2015-12-20 DIAGNOSIS — S39012A Strain of muscle, fascia and tendon of lower back, initial encounter: Secondary | ICD-10-CM | POA: Diagnosis not present

## 2015-12-20 DIAGNOSIS — S29012A Strain of muscle and tendon of back wall of thorax, initial encounter: Secondary | ICD-10-CM | POA: Diagnosis not present

## 2015-12-20 DIAGNOSIS — Y999 Unspecified external cause status: Secondary | ICD-10-CM | POA: Diagnosis not present

## 2015-12-20 DIAGNOSIS — M5136 Other intervertebral disc degeneration, lumbar region: Secondary | ICD-10-CM | POA: Insufficient documentation

## 2015-12-20 DIAGNOSIS — S29019A Strain of muscle and tendon of unspecified wall of thorax, initial encounter: Secondary | ICD-10-CM

## 2015-12-20 MED ORDER — HYDROCODONE-ACETAMINOPHEN 5-325 MG PO TABS: 1.0000 | ORAL_TABLET | Freq: Four times a day (QID) | ORAL | Status: DC | PRN

## 2015-12-20 MED ORDER — HYDROMORPHONE HCL 1 MG/ML IJ SOLN
1.0000 mg | Freq: Once | INTRAMUSCULAR | Status: AC
  Administered 2015-12-20: 1 mg via INTRAMUSCULAR

## 2015-12-20 MED ORDER — METHOCARBAMOL 750 MG PO TABS: 750.0000 mg | ORAL_TABLET | Freq: Four times a day (QID) | ORAL | Status: DC

## 2015-12-20 MED ORDER — HYDROMORPHONE HCL 1 MG/ML IJ SOLN
INTRAMUSCULAR | Status: AC
  Administered 2015-12-20: 1 mg via INTRAMUSCULAR
  Filled 2015-12-20: qty 1

## 2015-12-20 MED ORDER — KETOROLAC TROMETHAMINE 60 MG/2ML IM SOLN
30.0000 mg | Freq: Once | INTRAMUSCULAR | Status: AC
  Administered 2015-12-20: 30 mg via INTRAMUSCULAR
  Filled 2015-12-20: qty 2

## 2015-12-20 MED ORDER — OXYCODONE-ACETAMINOPHEN 5-325 MG PO TABS
1.0000 | ORAL_TABLET | Freq: Once | ORAL | Status: AC
  Administered 2015-12-20: 1 via ORAL
  Filled 2015-12-20: qty 1

## 2015-12-20 MED ORDER — KETOROLAC TROMETHAMINE 10 MG PO TABS: 10.0000 mg | ORAL_TABLET | Freq: Three times a day (TID) | ORAL | Status: DC

## 2015-12-20 MED ORDER — METHOCARBAMOL 1000 MG/10ML IJ SOLN
1000.0000 mg | Freq: Once | INTRAMUSCULAR | Status: AC
  Administered 2015-12-20: 1000 mg via INTRAMUSCULAR
  Filled 2015-12-20: qty 10
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
## 2015-12-20 NOTE — ED Notes (Signed)
Back from x-ray  Family at bedside  States no relief from pain meds
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
## 2016-02-18 ENCOUNTER — Other Ambulatory Visit: Payer: Self-pay | Admitting: Family Medicine

## 2016-02-19 NOTE — Telephone Encounter (Signed)
Ok plus one ref 

## 2016-02-28 DIAGNOSIS — R29818 Other symptoms and signs involving the nervous system: Secondary | ICD-10-CM | POA: Insufficient documentation

## 2016-03-12 ENCOUNTER — Other Ambulatory Visit: Payer: Self-pay | Admitting: Neurosurgery

## 2016-03-20 NOTE — Pre-Procedure Instructions (Signed)
Stephanie CostainJennifer L Noble  03/20/2016      [id number] - [place], [place] - [address] [facility] [address] [place] [id number] Phone: [phone] Fax: [phone]    Your procedure is scheduled on Mon, Sept 25 @ 11:15 AM  [facility] Admitting at 8:15 AM  Call this number if you have problems the morning of surgery:  [phone]   Remember:  Do not eat food or drink liquids after midnight.  Take these medicines the morning of surgery with A SIP OF WATER Albuterol<Bring Your Inhaler With You> and Pain Pill(if needed)               Stop taking your Celebrex. No Goody's,BC's,Aleve,Advil,Motrin,Ibuprofen,Fish Oil,or any Herbal Medications.     Do not wear jewelry, make-up or nail polish.  Do not wear lotions, powders, perfumes, or deoderant.  Do not shave 48 hours prior to surgery.    Do not bring valuables to the hospital.  [facility] is not [facility] for any belongings or valuables.  Contacts, dentures or bridgework may not be worn into surgery.  Leave your suitcase in the car.  After surgery it may be brought to your room.  For patients admitted to the hospital, discharge time will be determined by your treatment team.  Patients discharged the day of surgery will not be allowed to drive home.    [facility]  Before surgery, you can play an important role.  Because skin is not sterile, your skin needs to be as free of germs as possible.  You can reduce the number of germs on you skin by washing with CHG (chlorahexidine gluconate) soap before surgery.  CHG is an antiseptic cleaner which kills germs and bonds with the skin to continue killing germs even after washing.  Please DO NOT use if you have an allergy to CHG or antibacterial soaps.  If your skin becomes reddened/irritated stop using the CHG and inform your nurse when you arrive at Short Stay.  Do not shave (including legs and  underarms) for at least 48 hours prior to the first CHG shower.  You may shave your face.  Please follow these instructions carefully:   1.  Shower with CHG Soap the night before surgery and the                                morning of Surgery.  2.  If you choose to wash your hair, wash your hair first as usual with your       normal shampoo.  3.  After you shampoo, rinse your hair and body thoroughly to remove the                      Shampoo.  4.  Use CHG as you would any other liquid soap.  You can apply chg directly       to the skin and wash gently with scrungie or a clean washcloth.  5.  Apply the CHG Soap to your body ONLY FROM THE NECK DOWN.        Do not use on open wounds or open sores.  Avoid contact with your eyes,       ears, mouth and genitals (private parts).  Wash genitals (private parts)       with your normal soap.  6.  Wash thoroughly, paying special attention to the area where your surgery        will be performed.  7.  Thoroughly rinse your body with warm water from the neck down.  8.  DO NOT shower/wash with your normal soap after using and rinsing off       the CHG Soap.  9.  Pat yourself dry with a clean towel.            10.  Wear clean pajamas.            11.  Place clean sheets on your bed the night of your first shower and do not        sleep with pets.  Day of Surgery  Do not apply any lotions/deoderants the morning of surgery.  Please wear clean clothes to the hospital/surgery center.    Please read over the following fact sheets that you were given. Pain Booklet, Coughing and Deep Breathing, MRSA Information and Surgical Site Infection Prevention

## 2016-03-21 ENCOUNTER — Encounter (HOSPITAL_COMMUNITY): Payer: Self-pay

## 2016-03-21 ENCOUNTER — Encounter (HOSPITAL_COMMUNITY)
Admission: RE | Admit: 2016-03-21 | Discharge: 2016-03-21 | Disposition: A | Discharge status: Home / Self Care | Payer: BLUE CROSS/BLUE SHIELD | Source: Ambulatory Visit | Attending: Neurosurgery | Admitting: Neurosurgery

## 2016-03-21 DIAGNOSIS — Z0183 Encounter for blood typing: Secondary | ICD-10-CM | POA: Insufficient documentation

## 2016-03-21 DIAGNOSIS — M4806 Spinal stenosis, lumbar region: Secondary | ICD-10-CM | POA: Insufficient documentation

## 2016-03-21 DIAGNOSIS — Z01818 Encounter for other preprocedural examination: Secondary | ICD-10-CM | POA: Insufficient documentation

## 2016-03-21 DIAGNOSIS — Z01812 Encounter for preprocedural laboratory examination: Secondary | ICD-10-CM | POA: Diagnosis not present

## 2016-03-21 HISTORY — DX: Insomnia, unspecified: G47.00

## 2016-03-21 HISTORY — DX: Other muscle spasm: M62.838

## 2016-03-21 HISTORY — DX: Weakness: R53.1

## 2016-03-21 HISTORY — DX: Personal history of other diseases of the respiratory system: Z87.09

## 2016-03-21 LAB — CBC
HCT: 41.8 % (ref 36.0–46.0)
Hemoglobin: 13.9 g/dL (ref 12.0–15.0)
MCH: 29.8 pg (ref 26.0–34.0)
MCHC: 33.3 g/dL (ref 30.0–36.0)
MCV: 89.5 fL (ref 78.0–100.0)
PLATELETS: 224 10*3/uL (ref 150–400)
RBC: 4.67 MIL/uL (ref 3.87–5.11)
RDW: 12.5 % (ref 11.5–15.5)
WBC: 3.8 10*3/uL — AB (ref 4.0–10.5)

## 2016-03-21 LAB — BASIC METABOLIC PANEL
Anion gap: 5 (ref 5–15)
BUN: 11 mg/dL (ref 6–20)
CHLORIDE: 107 mmol/L (ref 101–111)
CO2: 29 mmol/L (ref 22–32)
CREATININE: 0.74 mg/dL (ref 0.44–1.00)
Calcium: 9.5 mg/dL (ref 8.9–10.3)
Glucose, Bld: 84 mg/dL (ref 65–99)
POTASSIUM: 3.6 mmol/L (ref 3.5–5.1)
SODIUM: 141 mmol/L (ref 135–145)

## 2016-03-21 LAB — TYPE AND SCREEN
ABO/RH(D): O POS
ANTIBODY SCREEN: NEGATIVE

## 2016-03-21 MED ORDER — CHLORHEXIDINE GLUCONATE CLOTH 2 % EX PADS: 6.0000 | MEDICATED_PAD | Freq: Once | CUTANEOUS | Status: DC

## 2016-03-21 NOTE — Progress Notes (Signed)
Nasal swab not done bc pt has been using Mupirocin for the last several days.

## 2016-03-21 NOTE — Progress Notes (Addendum)
Cardiologist denies  Medical Md is Dr.Steve Luking  Echo denies  Stress test denies  Heart cath denies  EKG denies in past yr  CXR denies in past yr

## 2016-03-24 ENCOUNTER — Encounter (HOSPITAL_COMMUNITY): Admission: RE | Payer: Self-pay | Source: Ambulatory Visit

## 2016-03-24 ENCOUNTER — Inpatient Hospital Stay (HOSPITAL_COMMUNITY): Admission: RE | Admit: 2016-03-24 | Payer: BLUE CROSS/BLUE SHIELD | Source: Ambulatory Visit | Admitting: Neurosurgery

## 2016-03-24 SURGERY — POSTERIOR LUMBAR FUSION 1 LEVEL
Anesthesia: General | Site: Back

## 2016-03-25 ENCOUNTER — Other Ambulatory Visit: Payer: Self-pay | Admitting: Neurosurgery

## 2016-04-15 ENCOUNTER — Encounter (HOSPITAL_COMMUNITY): Payer: Self-pay | Admitting: *Deleted

## 2016-04-15 NOTE — Progress Notes (Signed)
Spoke with pt for pre-op call. Pt denies cardiac history, chest pain or sob. 

## 2016-04-16 ENCOUNTER — Encounter (HOSPITAL_COMMUNITY)
Admission: RE | Disposition: A | Discharge status: Home / Self Care | Payer: Self-pay | Source: Ambulatory Visit | Attending: Neurosurgery

## 2016-04-16 ENCOUNTER — Inpatient Hospital Stay (HOSPITAL_COMMUNITY): Payer: BLUE CROSS/BLUE SHIELD

## 2016-04-16 ENCOUNTER — Inpatient Hospital Stay (HOSPITAL_COMMUNITY)
Admission: RE | Admit: 2016-04-16 | Discharge: 2016-04-20 | DRG: 453 | Disposition: A | Discharge status: Home / Self Care | Payer: BLUE CROSS/BLUE SHIELD | Source: Ambulatory Visit | Attending: Neurosurgery | Admitting: Neurosurgery

## 2016-04-16 ENCOUNTER — Inpatient Hospital Stay (HOSPITAL_COMMUNITY): Payer: BLUE CROSS/BLUE SHIELD | Admitting: Anesthesiology

## 2016-04-16 ENCOUNTER — Observation Stay (HOSPITAL_COMMUNITY): Payer: BLUE CROSS/BLUE SHIELD

## 2016-04-16 DIAGNOSIS — Y92230 Patient room in hospital as the place of occurrence of the external cause: Secondary | ICD-10-CM | POA: Diagnosis not present

## 2016-04-16 DIAGNOSIS — E861 Hypovolemia: Secondary | ICD-10-CM | POA: Diagnosis not present

## 2016-04-16 DIAGNOSIS — T411X5A Adverse effect of intravenous anesthetics, initial encounter: Secondary | ICD-10-CM | POA: Diagnosis present

## 2016-04-16 DIAGNOSIS — M5126 Other intervertebral disc displacement, lumbar region: Secondary | ICD-10-CM | POA: Diagnosis not present

## 2016-04-16 DIAGNOSIS — E876 Hypokalemia: Secondary | ICD-10-CM | POA: Diagnosis not present

## 2016-04-16 DIAGNOSIS — M419 Scoliosis, unspecified: Secondary | ICD-10-CM | POA: Diagnosis present

## 2016-04-16 DIAGNOSIS — M5136 Other intervertebral disc degeneration, lumbar region: Secondary | ICD-10-CM | POA: Diagnosis present

## 2016-04-16 DIAGNOSIS — I959 Hypotension, unspecified: Secondary | ICD-10-CM | POA: Diagnosis not present

## 2016-04-16 DIAGNOSIS — Z79899 Other long term (current) drug therapy: Secondary | ICD-10-CM

## 2016-04-16 DIAGNOSIS — R0789 Other chest pain: Secondary | ICD-10-CM

## 2016-04-16 DIAGNOSIS — T398X5A Adverse effect of other nonopioid analgesics and antipyretics, not elsewhere classified, initial encounter: Secondary | ICD-10-CM | POA: Diagnosis not present

## 2016-04-16 DIAGNOSIS — I468 Cardiac arrest due to other underlying condition: Secondary | ICD-10-CM | POA: Diagnosis not present

## 2016-04-16 DIAGNOSIS — J45909 Unspecified asthma, uncomplicated: Secondary | ICD-10-CM | POA: Diagnosis present

## 2016-04-16 DIAGNOSIS — K5900 Constipation, unspecified: Secondary | ICD-10-CM | POA: Diagnosis present

## 2016-04-16 DIAGNOSIS — Z419 Encounter for procedure for purposes other than remedying health state, unspecified: Secondary | ICD-10-CM

## 2016-04-16 DIAGNOSIS — R079 Chest pain, unspecified: Secondary | ICD-10-CM

## 2016-04-16 DIAGNOSIS — M47816 Spondylosis without myelopathy or radiculopathy, lumbar region: Secondary | ICD-10-CM | POA: Diagnosis present

## 2016-04-16 DIAGNOSIS — G8929 Other chronic pain: Secondary | ICD-10-CM | POA: Diagnosis present

## 2016-04-16 DIAGNOSIS — I469 Cardiac arrest, cause unspecified: Secondary | ICD-10-CM

## 2016-04-16 DIAGNOSIS — M48062 Spinal stenosis, lumbar region with neurogenic claudication: Principal | ICD-10-CM | POA: Diagnosis present

## 2016-04-16 DIAGNOSIS — T402X5A Adverse effect of other opioids, initial encounter: Secondary | ICD-10-CM | POA: Diagnosis not present

## 2016-04-16 LAB — COMPREHENSIVE METABOLIC PANEL
ALT: 42 U/L (ref 14–54)
AST: 62 U/L — AB (ref 15–41)
Albumin: 3.2 g/dL — ABNORMAL LOW (ref 3.5–5.0)
Alkaline Phosphatase: 40 U/L (ref 38–126)
Anion gap: 11 (ref 5–15)
BUN: 8 mg/dL (ref 6–20)
CHLORIDE: 104 mmol/L (ref 101–111)
CO2: 21 mmol/L — AB (ref 22–32)
CREATININE: 0.86 mg/dL (ref 0.44–1.00)
Calcium: 8.2 mg/dL — ABNORMAL LOW (ref 8.9–10.3)
GFR calc Af Amer: >60 mL/min (ref >60)
GFR calc non Af Amer: >60 mL/min (ref >60)
Glucose, Bld: 275 mg/dL — ABNORMAL HIGH (ref 65–99)
Potassium: 2.9 mmol/L — ABNORMAL LOW (ref 3.5–5.1)
SODIUM: 136 mmol/L (ref 135–145)
Total Bilirubin: 0.4 mg/dL (ref 0.3–1.2)
Total Protein: 5.4 g/dL — ABNORMAL LOW (ref 6.5–8.1)

## 2016-04-16 LAB — CBC
HCT: 43 % (ref 36.0–46.0)
HEMOGLOBIN: 14.8 g/dL (ref 12.0–15.0)
MCH: 30.1 pg (ref 26.0–34.0)
MCHC: 34.4 g/dL (ref 30.0–36.0)
MCV: 87.6 fL (ref 78.0–100.0)
PLATELETS: 210 10*3/uL (ref 150–400)
RBC: 4.91 MIL/uL (ref 3.87–5.11)
RDW: 12.4 % (ref 11.5–15.5)
WBC: 4.1 10*3/uL (ref 4.0–10.5)

## 2016-04-16 LAB — GLUCOSE, CAPILLARY: Glucose-Capillary: 169 mg/dL — ABNORMAL HIGH (ref 65–99)

## 2016-04-16 LAB — TYPE AND SCREEN
ABO/RH(D): O POS
Antibody Screen: NEGATIVE

## 2016-04-16 LAB — SURGICAL PCR SCREEN
MRSA, PCR: NEGATIVE
Staphylococcus aureus: NEGATIVE

## 2016-04-16 LAB — TROPONIN I: Troponin I: <0.03 ng/mL (ref <0.03)

## 2016-04-16 SURGERY — POSTERIOR LUMBAR FUSION 1 LEVEL
Anesthesia: General | Site: Back

## 2016-04-16 MED ORDER — FENTANYL CITRATE (PF) 100 MCG/2ML IJ SOLN
INTRAMUSCULAR | Status: AC
  Filled 2016-04-16: qty 2

## 2016-04-16 MED ORDER — PHENOL 1.4 % MT LIQD: 1.0000 | OROMUCOSAL | Status: DC | PRN

## 2016-04-16 MED ORDER — HYDROXYZINE HCL 50 MG/ML IM SOLN
50.0000 mg | INTRAMUSCULAR | Status: DC | PRN
Start: 2016-04-16 — End: 2016-04-17
  Administered 2016-04-16: 50 mg via INTRAMUSCULAR
  Filled 2016-04-16 (×2): qty 1

## 2016-04-16 MED ORDER — DEXAMETHASONE SODIUM PHOSPHATE 10 MG/ML IJ SOLN
INTRAMUSCULAR | Status: DC | PRN
  Administered 2016-04-16: 5 mg via INTRAVENOUS

## 2016-04-16 MED ORDER — ACETAMINOPHEN 10 MG/ML IV SOLN
INTRAVENOUS | Status: DC | PRN
  Administered 2016-04-16: 1000 mg via INTRAVENOUS

## 2016-04-16 MED ORDER — ZOLPIDEM TARTRATE 5 MG PO TABS: 5.0000 mg | ORAL_TABLET | Freq: Every evening | ORAL | Status: DC | PRN

## 2016-04-16 MED ORDER — OXYCODONE-ACETAMINOPHEN 5-325 MG PO TABS
1.0000 | ORAL_TABLET | ORAL | Status: DC | PRN
  Administered 2016-04-16 – 2016-04-18 (×4): 2 via ORAL
  Administered 2016-04-19 (×3): 1 via ORAL
  Filled 2016-04-16: qty 1
  Filled 2016-04-16: qty 2
  Filled 2016-04-16 (×2): qty 1
  Filled 2016-04-16 (×3): qty 2

## 2016-04-16 MED ORDER — LIDOCAINE-EPINEPHRINE 1 %-1:100000 IJ SOLN
INTRAMUSCULAR | Status: DC | PRN
  Administered 2016-04-16: 14.5 mL via INTRADERMAL

## 2016-04-16 MED ORDER — OXYCODONE HCL 5 MG PO TABS: 5.0000 mg | ORAL_TABLET | Freq: Once | ORAL | Status: DC

## 2016-04-16 MED ORDER — CEFAZOLIN SODIUM-DEXTROSE 2-4 GM/100ML-% IV SOLN
INTRAVENOUS | Status: AC
  Filled 2016-04-16: qty 100

## 2016-04-16 MED ORDER — LACTATED RINGERS IV SOLN
INTRAVENOUS | Status: DC
  Administered 2016-04-16 (×4): via INTRAVENOUS

## 2016-04-16 MED ORDER — CELECOXIB 200 MG PO CAPS
200.0000 mg | ORAL_CAPSULE | Freq: Every day | ORAL | Status: DC
  Administered 2016-04-16: 200 mg via ORAL
  Filled 2016-04-16: qty 1

## 2016-04-16 MED ORDER — ZOLPIDEM TARTRATE 5 MG PO TABS
5.0000 mg | ORAL_TABLET | Freq: Every evening | ORAL | Status: DC | PRN
  Administered 2016-04-19: 5 mg via ORAL
  Filled 2016-04-16: qty 1

## 2016-04-16 MED ORDER — HYDROMORPHONE HCL 2 MG/ML IJ SOLN
INTRAMUSCULAR | Status: AC
  Filled 2016-04-16: qty 1

## 2016-04-16 MED ORDER — HYDROCODONE-ACETAMINOPHEN 5-325 MG PO TABS
1.0000 | ORAL_TABLET | ORAL | Status: DC | PRN
  Administered 2016-04-18 – 2016-04-19 (×3): 1 via ORAL
  Administered 2016-04-19: 2 via ORAL
  Administered 2016-04-20 (×2): 1 via ORAL
  Filled 2016-04-16: qty 2
  Filled 2016-04-16: qty 1
  Filled 2016-04-16: qty 2
  Filled 2016-04-16 (×3): qty 1

## 2016-04-16 MED ORDER — SODIUM CHLORIDE 0.9% FLUSH
3.0000 mL | Freq: Two times a day (BID) | INTRAVENOUS | Status: DC
  Administered 2016-04-16 – 2016-04-17 (×2): 3 mL via INTRAVENOUS

## 2016-04-16 MED ORDER — SUGAMMADEX SODIUM 200 MG/2ML IV SOLN
INTRAVENOUS | Status: AC
  Filled 2016-04-16: qty 2

## 2016-04-16 MED ORDER — ARTIFICIAL TEARS OP OINT
TOPICAL_OINTMENT | OPHTHALMIC | Status: AC
  Filled 2016-04-16: qty 3.5

## 2016-04-16 MED ORDER — DEXAMETHASONE SODIUM PHOSPHATE 10 MG/ML IJ SOLN
INTRAMUSCULAR | Status: AC
Start: 2016-04-16 — End: 2016-04-16
  Filled 2016-04-16: qty 1

## 2016-04-16 MED ORDER — OXYCODONE HCL 5 MG PO TABS
ORAL_TABLET | ORAL | Status: AC
  Filled 2016-04-16: qty 1

## 2016-04-16 MED ORDER — MENTHOL 3 MG MT LOZG: 1.0000 | LOZENGE | OROMUCOSAL | Status: DC | PRN

## 2016-04-16 MED ORDER — ONDANSETRON HCL 4 MG/2ML IJ SOLN
4.0000 mg | Freq: Four times a day (QID) | INTRAMUSCULAR | Status: DC | PRN
  Administered 2016-04-16 (×2): 4 mg via INTRAVENOUS
  Filled 2016-04-16 (×2): qty 2

## 2016-04-16 MED ORDER — ALBUTEROL SULFATE (2.5 MG/3ML) 0.083% IN NEBU: 3.0000 mL | INHALATION_SOLUTION | Freq: Four times a day (QID) | RESPIRATORY_TRACT | Status: DC | PRN

## 2016-04-16 MED ORDER — PROPOFOL 10 MG/ML IV BOLUS
INTRAVENOUS | Status: AC
  Filled 2016-04-16: qty 20

## 2016-04-16 MED ORDER — CHLORHEXIDINE GLUCONATE CLOTH 2 % EX PADS: 6.0000 | MEDICATED_PAD | Freq: Once | CUTANEOUS | Status: DC

## 2016-04-16 MED ORDER — OXYCODONE HCL 5 MG/5ML PO SOLN: 5.0000 mg | Freq: Once | ORAL | Status: AC | PRN

## 2016-04-16 MED ORDER — MAGNESIUM HYDROXIDE 400 MG/5ML PO SUSP: 30.0000 mL | Freq: Every day | ORAL | Status: DC | PRN

## 2016-04-16 MED ORDER — SCOPOLAMINE 1 MG/3DAYS TD PT72: 1.0000 | MEDICATED_PATCH | TRANSDERMAL | Status: DC

## 2016-04-16 MED ORDER — METHOCARBAMOL 500 MG PO TABS
500.0000 mg | ORAL_TABLET | Freq: Four times a day (QID) | ORAL | Status: DC | PRN
  Administered 2016-04-16 – 2016-04-19 (×3): 500 mg via ORAL
  Filled 2016-04-16 (×3): qty 1

## 2016-04-16 MED ORDER — HYDROXYZINE HCL 25 MG PO TABS: 50.0000 mg | ORAL_TABLET | ORAL | Status: DC | PRN

## 2016-04-16 MED ORDER — THROMBIN 20000 UNITS EX SOLR
CUTANEOUS | Status: AC
Start: 2016-04-16 — End: 2016-04-16
  Filled 2016-04-16: qty 20000

## 2016-04-16 MED ORDER — ACETAMINOPHEN 650 MG RE SUPP: 650.0000 mg | RECTAL | Status: DC | PRN

## 2016-04-16 MED ORDER — FENTANYL CITRATE (PF) 100 MCG/2ML IJ SOLN
INTRAMUSCULAR | Status: DC | PRN
  Administered 2016-04-16: 150 ug via INTRAVENOUS
  Administered 2016-04-16 (×3): 50 ug via INTRAVENOUS

## 2016-04-16 MED ORDER — THROMBIN 20000 UNITS EX SOLR
CUTANEOUS | Status: DC | PRN
  Administered 2016-04-16: 14:00:00 via TOPICAL

## 2016-04-16 MED ORDER — KCL IN DEXTROSE-NACL 20-5-0.45 MEQ/L-%-% IV SOLN
INTRAVENOUS | Status: DC
  Administered 2016-04-16: 19:00:00 via INTRAVENOUS
  Filled 2016-04-16 (×3): qty 1000

## 2016-04-16 MED ORDER — LIDOCAINE 2% (20 MG/ML) 5 ML SYRINGE
INTRAMUSCULAR | Status: AC
  Filled 2016-04-16: qty 5

## 2016-04-16 MED ORDER — ROCURONIUM BROMIDE 100 MG/10ML IV SOLN
INTRAVENOUS | Status: DC | PRN
  Administered 2016-04-16 (×2): 10 mg via INTRAVENOUS
  Administered 2016-04-16: 50 mg via INTRAVENOUS

## 2016-04-16 MED ORDER — MUPIROCIN 2 % EX OINT
TOPICAL_OINTMENT | CUTANEOUS | Status: AC
  Filled 2016-04-16: qty 22

## 2016-04-16 MED ORDER — OXYCODONE HCL 5 MG PO TABS
5.0000 mg | ORAL_TABLET | Freq: Once | ORAL | Status: AC | PRN
Start: 2016-04-16 — End: 2016-04-16
  Administered 2016-04-16: 5 mg via ORAL

## 2016-04-16 MED ORDER — THROMBIN 5000 UNITS EX SOLR
CUTANEOUS | Status: AC
Start: 2016-04-16 — End: 2016-04-16
  Filled 2016-04-16: qty 5000

## 2016-04-16 MED ORDER — PHENYLEPHRINE HCL 10 MG/ML IJ SOLN
INTRAVENOUS | Status: DC | PRN
  Administered 2016-04-16: 40 ug/min via INTRAVENOUS

## 2016-04-16 MED ORDER — BUPIVACAINE HCL (PF) 0.5 % IJ SOLN
INTRAMUSCULAR | Status: AC
  Filled 2016-04-16: qty 30

## 2016-04-16 MED ORDER — HYDROMORPHONE HCL 2 MG/ML IJ SOLN
0.5000 mg | INTRAMUSCULAR | Status: AC | PRN
  Administered 2016-04-16 (×4): 0.5 mg via INTRAVENOUS

## 2016-04-16 MED ORDER — PROPOFOL 10 MG/ML IV BOLUS
INTRAVENOUS | Status: DC | PRN
  Administered 2016-04-16: 200 mg via INTRAVENOUS

## 2016-04-16 MED ORDER — ONDANSETRON HCL 4 MG/2ML IJ SOLN: 4.0000 mg | Freq: Once | INTRAMUSCULAR | Status: DC | PRN

## 2016-04-16 MED ORDER — SODIUM CHLORIDE 0.9 % IV SOLN: 250.0000 mL | INTRAVENOUS | Status: DC

## 2016-04-16 MED ORDER — GABAPENTIN 300 MG PO CAPS
900.0000 mg | ORAL_CAPSULE | Freq: Every day | ORAL | Status: DC
  Administered 2016-04-17 – 2016-04-19 (×3): 900 mg via ORAL
  Filled 2016-04-16 (×4): qty 3

## 2016-04-16 MED ORDER — ONDANSETRON HCL 4 MG PO TABS: 4.0000 mg | ORAL_TABLET | Freq: Four times a day (QID) | ORAL | Status: DC | PRN

## 2016-04-16 MED ORDER — ONDANSETRON HCL 4 MG/2ML IJ SOLN
INTRAMUSCULAR | Status: AC
  Filled 2016-04-16: qty 2

## 2016-04-16 MED ORDER — SODIUM CHLORIDE 0.9% FLUSH: 3.0000 mL | INTRAVENOUS | Status: DC | PRN

## 2016-04-16 MED ORDER — KETOROLAC TROMETHAMINE 30 MG/ML IJ SOLN
30.0000 mg | Freq: Four times a day (QID) | INTRAMUSCULAR | Status: AC
  Administered 2016-04-16 – 2016-04-18 (×7): 30 mg via INTRAVENOUS
  Filled 2016-04-16 (×7): qty 1

## 2016-04-16 MED ORDER — ARTIFICIAL TEARS OP OINT
TOPICAL_OINTMENT | OPHTHALMIC | Status: DC | PRN
  Administered 2016-04-16: 1 via OPHTHALMIC

## 2016-04-16 MED ORDER — ACETAMINOPHEN 10 MG/ML IV SOLN
INTRAVENOUS | Status: AC
  Filled 2016-04-16: qty 100

## 2016-04-16 MED ORDER — LIDOCAINE HCL (CARDIAC) 20 MG/ML IV SOLN
INTRAVENOUS | Status: DC | PRN
  Administered 2016-04-16: 100 mg via INTRAVENOUS

## 2016-04-16 MED ORDER — SUGAMMADEX SODIUM 200 MG/2ML IV SOLN
INTRAVENOUS | Status: DC | PRN
  Administered 2016-04-16: 100 mg via INTRAVENOUS

## 2016-04-16 MED ORDER — SCOPOLAMINE 1 MG/3DAYS TD PT72
MEDICATED_PATCH | TRANSDERMAL | Status: AC
  Filled 2016-04-16: qty 1

## 2016-04-16 MED ORDER — ROCURONIUM BROMIDE 10 MG/ML (PF) SYRINGE
PREFILLED_SYRINGE | INTRAVENOUS | Status: AC
  Filled 2016-04-16: qty 10

## 2016-04-16 MED ORDER — BISACODYL 10 MG RE SUPP: 10.0000 mg | Freq: Every day | RECTAL | Status: DC | PRN

## 2016-04-16 MED ORDER — KETOROLAC TROMETHAMINE 30 MG/ML IJ SOLN
INTRAMUSCULAR | Status: AC
  Filled 2016-04-16: qty 1

## 2016-04-16 MED ORDER — HYDROMORPHONE HCL 2 MG/ML IJ SOLN
0.2500 mg | INTRAMUSCULAR | Status: DC | PRN
  Administered 2016-04-16 (×4): 0.5 mg via INTRAVENOUS

## 2016-04-16 MED ORDER — ACETAMINOPHEN 325 MG PO TABS: 650.0000 mg | ORAL_TABLET | ORAL | Status: DC | PRN

## 2016-04-16 MED ORDER — LIDOCAINE-EPINEPHRINE 1 %-1:100000 IJ SOLN
INTRAMUSCULAR | Status: AC
  Filled 2016-04-16: qty 1

## 2016-04-16 MED ORDER — NALOXONE HCL 0.4 MG/ML IJ SOLN
INTRAMUSCULAR | Status: AC
  Administered 2016-04-16: 20:00:00
  Filled 2016-04-16: qty 1

## 2016-04-16 MED ORDER — SODIUM CHLORIDE 0.9 % IR SOLN
Status: DC | PRN
  Administered 2016-04-16: 14:00:00

## 2016-04-16 MED ORDER — ONDANSETRON HCL 4 MG/2ML IJ SOLN
INTRAMUSCULAR | Status: DC | PRN
  Administered 2016-04-16: 4 mg via INTRAVENOUS

## 2016-04-16 MED ORDER — BUPIVACAINE HCL (PF) 0.5 % IJ SOLN
INTRAMUSCULAR | Status: DC | PRN
  Administered 2016-04-16: 14.5 mL

## 2016-04-16 MED ORDER — CEFAZOLIN SODIUM-DEXTROSE 2-4 GM/100ML-% IV SOLN
2.0000 g | INTRAVENOUS | Status: AC
  Administered 2016-04-16 (×2): 2 g via INTRAVENOUS

## 2016-04-16 MED ORDER — MIDAZOLAM HCL 2 MG/2ML IJ SOLN
INTRAMUSCULAR | Status: AC
  Filled 2016-04-16: qty 2

## 2016-04-16 MED ORDER — OXYCODONE HCL 5 MG PO TABS
ORAL_TABLET | ORAL | Status: AC
  Administered 2016-04-16: 5 mg
  Filled 2016-04-16: qty 1

## 2016-04-16 MED ORDER — GELATIN ABSORBABLE MT POWD
OROMUCOSAL | Status: DC | PRN
  Administered 2016-04-16: 14:00:00 via TOPICAL

## 2016-04-16 MED ORDER — MORPHINE SULFATE (PF) 4 MG/ML IV SOLN
4.0000 mg | INTRAVENOUS | Status: DC | PRN
  Administered 2016-04-16 – 2016-04-17 (×5): 4 mg via INTRAMUSCULAR
  Filled 2016-04-16 (×6): qty 1

## 2016-04-16 MED ORDER — 0.9 % SODIUM CHLORIDE (POUR BTL) OPTIME
TOPICAL | Status: DC | PRN
  Administered 2016-04-16 (×2): 1000 mL

## 2016-04-16 MED ORDER — SUCCINYLCHOLINE CHLORIDE 200 MG/10ML IV SOSY
PREFILLED_SYRINGE | INTRAVENOUS | Status: AC
Start: 2016-04-16 — End: 2016-04-16
  Filled 2016-04-16: qty 10

## 2016-04-16 MED ORDER — KETOROLAC TROMETHAMINE 30 MG/ML IJ SOLN
30.0000 mg | Freq: Once | INTRAMUSCULAR | Status: AC
  Administered 2016-04-16: 30 mg via INTRAVENOUS

## 2016-04-16 MED ORDER — CYCLOBENZAPRINE HCL 10 MG PO TABS
10.0000 mg | ORAL_TABLET | Freq: Three times a day (TID) | ORAL | Status: DC | PRN
  Administered 2016-04-17 – 2016-04-19 (×3): 10 mg via ORAL
  Filled 2016-04-16 (×4): qty 1

## 2016-04-16 MED ORDER — ALUM & MAG HYDROXIDE-SIMETH 200-200-20 MG/5ML PO SUSP: 30.0000 mL | Freq: Four times a day (QID) | ORAL | Status: DC | PRN

## 2016-04-16 SURGICAL SUPPLY — 73 items
ADH SKN CLS APL DERMABOND .7 (GAUZE/BANDAGES/DRESSINGS) ×1
BAG DECANTER FOR FLEXI CONT (MISCELLANEOUS) ×2 IMPLANT
BLADE CLIPPER SURG (BLADE) IMPLANT
BUR ACRON 5.0MM COATED (BURR) ×3 IMPLANT
BUR MATCHSTICK NEURO 3.0 LAGG (BURR) ×2 IMPLANT
CANISTER SUCT 3000ML PPV (MISCELLANEOUS) ×2 IMPLANT
CAP LCK SPNE (Orthopedic Implant) ×6 IMPLANT
CAP LOCK SPINE RADIUS (Orthopedic Implant) IMPLANT
CAP LOCKING (Orthopedic Implant) ×12 IMPLANT
CATH FOLEY 2WAY SLVR  5CC 12FR (CATHETERS) ×1
CATH FOLEY 2WAY SLVR  5CC 14FR (CATHETERS) ×2
CATH FOLEY 2WAY SLVR 5CC 12FR (CATHETERS) IMPLANT
CATH FOLEY 2WAY SLVR 5CC 14FR (CATHETERS) IMPLANT
CONT SPEC 4OZ CLIKSEAL STRL BL (MISCELLANEOUS) ×2 IMPLANT
COVER BACK TABLE 60X90IN (DRAPES) ×2 IMPLANT
DERMABOND ADVANCED (GAUZE/BANDAGES/DRESSINGS) ×1
DERMABOND ADVANCED .7 DNX12 (GAUZE/BANDAGES/DRESSINGS) ×2 IMPLANT
DRAPE C-ARM 42X72 X-RAY (DRAPES) ×5 IMPLANT
DRAPE HALF SHEET 40X57 (DRAPES) ×2 IMPLANT
DRAPE LAPAROTOMY 100X72X124 (DRAPES) ×2 IMPLANT
DRAPE POUCH INSTRU U-SHP 10X18 (DRAPES) ×2 IMPLANT
ELECT REM PT RETURN 9FT ADLT (ELECTROSURGICAL) ×2
ELECTRODE REM PT RTRN 9FT ADLT (ELECTROSURGICAL) ×1 IMPLANT
GAUZE SPONGE 4X4 12PLY STRL (GAUZE/BANDAGES/DRESSINGS) ×2 IMPLANT
GAUZE SPONGE 4X4 16PLY XRAY LF (GAUZE/BANDAGES/DRESSINGS) IMPLANT
GLOVE BIOGEL PI IND STRL 6.5 (GLOVE) IMPLANT
GLOVE BIOGEL PI IND STRL 7.5 (GLOVE) IMPLANT
GLOVE BIOGEL PI IND STRL 8 (GLOVE) ×2 IMPLANT
GLOVE BIOGEL PI INDICATOR 6.5 (GLOVE) ×1
GLOVE BIOGEL PI INDICATOR 7.5 (GLOVE) ×1
GLOVE BIOGEL PI INDICATOR 8 (GLOVE) ×2
GLOVE ECLIPSE 7.5 STRL STRAW (GLOVE) ×4 IMPLANT
GLOVE SURG SS PI 6.5 STRL IVOR (GLOVE) ×1 IMPLANT
GOWN STRL REUS W/ TWL LRG LVL3 (GOWN DISPOSABLE) ×1 IMPLANT
GOWN STRL REUS W/ TWL XL LVL3 (GOWN DISPOSABLE) ×1 IMPLANT
GOWN STRL REUS W/TWL 2XL LVL3 (GOWN DISPOSABLE) IMPLANT
GOWN STRL REUS W/TWL LRG LVL3 (GOWN DISPOSABLE) ×2
GOWN STRL REUS W/TWL XL LVL3 (GOWN DISPOSABLE) ×4
HEMOSTAT POWDER SURGIFOAM 1G (HEMOSTASIS) ×1 IMPLANT
KIT BASIN OR (CUSTOM PROCEDURE TRAY) ×2 IMPLANT
KIT INFUSE X SMALL 1.4CC (Orthopedic Implant) ×1 IMPLANT
KIT ROOM TURNOVER OR (KITS) ×2 IMPLANT
NDL ASP BONE MRW 8GX15 (NEEDLE) IMPLANT
NDL SPNL 18GX3.5 QUINCKE PK (NEEDLE) IMPLANT
NDL SPNL 22GX3.5 QUINCKE BK (NEEDLE) ×2 IMPLANT
NEEDLE ASP BONE MRW 8GX15 (NEEDLE) ×2 IMPLANT
NEEDLE SPNL 18GX3.5 QUINCKE PK (NEEDLE) IMPLANT
NEEDLE SPNL 22GX3.5 QUINCKE BK (NEEDLE) ×4 IMPLANT
NS IRRIG 1000ML POUR BTL (IV SOLUTION) ×2 IMPLANT
PACK LAMINECTOMY NEURO (CUSTOM PROCEDURE TRAY) ×2 IMPLANT
PAD ARMBOARD 7.5X6 YLW CONV (MISCELLANEOUS) ×6 IMPLANT
PATTIES SURGICAL .5 X.5 (GAUZE/BANDAGES/DRESSINGS) IMPLANT
PATTIES SURGICAL .5 X1 (DISPOSABLE) IMPLANT
PATTIES SURGICAL 1X1 (DISPOSABLE) ×1 IMPLANT
PEEK PLIF AVS 8X25X4 DEGREE (Peek) ×2 IMPLANT
ROD 70MM (Rod) ×4 IMPLANT
ROD SPNL 70X5.5XNS TI RDS (Rod) IMPLANT
SCREW 5.75X45MM (Screw) ×2 IMPLANT
SPONGE LAP 4X18 X RAY DECT (DISPOSABLE) IMPLANT
SPONGE NEURO XRAY DETECT 1X3 (DISPOSABLE) IMPLANT
SPONGE SURGIFOAM ABS GEL 100 (HEMOSTASIS) ×2 IMPLANT
STRIP BIOACTIVE VITOSS 25X100X (Neuro Prosthesis/Implant) ×1 IMPLANT
STRIP BIOACTIVE VITOSS 25X52X4 (Orthopedic Implant) ×1 IMPLANT
SUT VIC AB 1 CT1 18XBRD ANBCTR (SUTURE) ×2 IMPLANT
SUT VIC AB 1 CT1 8-18 (SUTURE) ×4
SUT VIC AB 2-0 CP2 18 (SUTURE) ×5 IMPLANT
SYR 3ML LL SCALE MARK (SYRINGE) IMPLANT
SYR CONTROL 10ML LL (SYRINGE) ×3 IMPLANT
TAPE CLOTH SURG 4X10 WHT LF (GAUZE/BANDAGES/DRESSINGS) ×1 IMPLANT
TOWEL OR 17X24 6PK STRL BLUE (TOWEL DISPOSABLE) ×2 IMPLANT
TOWEL OR 17X26 10 PK STRL BLUE (TOWEL DISPOSABLE) ×2 IMPLANT
TRAY FOLEY W/METER SILVER 16FR (SET/KITS/TRAYS/PACK) ×2 IMPLANT
WATER STERILE IRR 1000ML POUR (IV SOLUTION) ×2 IMPLANT

## 2016-04-16 NOTE — Op Note (Signed)
04/16/2016  6:21 PM  PATIENT:  Stephanie Noble  60 y.o. 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, lumbar stenosis with neurogenic claudication, lumbar scoliosis, umbar spondylosis, lumbar degenerative disc disease  PROCEDURE:  Procedure(s):  1)  Explantation of L5 pedicle screws, rods, and locking caps;  2) bilateral L2-3 lumbar laminectomy, facetectomy, and foraminotomies, for decompression of the stenotic compression of the exiting L2 and L3 nerve roots, with decompression beyond that required for interbody arthrodesis; bilateral L2-3 posterior lumbar interbody arthrodesis with AVS peek interbody implants, V toss BA with bone marrow aspirate, and infuse; bilateral L2-3 posterior lateral arthrodesis with segmental radius posterior instrumentation (new pedicle screws at L2 tied into existing pedicle screws at L3 and L4 bilaterally), locally harvested morselized autograft,V toss BA with bone marrow aspirate,and infuse  SURGEON:  Surgeon(s): Shirlean Kelly, MD Hilda Lias, MD  ASSISTANTS:Ernesto Jeral Fruit, M.D.  ANESTHESIA:   general  EBL:  Total I/O In: 2200 [I.V.:2200] Out: 550 [Urine:350; Blood:200]  BLOOD ADMINISTERED:none  CELL SAVER GIVEN:  Cell Saver technician felt that there was insufficient blood loss to process the collected blood.  COUNT:  Correct per nursing staff  DICTATION: Patient is brought to the operating room placed under general endotracheal anesthesia. The patient was turned to prone position the lumbar region was prepped with Betadine soap and solution and draped in a sterile fashion. The midline was infiltrated with local anesthesia with epinephrine. A localizing x-ray was taken and then a midline incision was made through the previous midline incision and  carried down through the subcutaneous tissue, bipolar cautery and electrocautery were used to maintain hemostasis. Dissection was carried down to the lumbar fascia. The fascia was incised bilaterally and the paraspinal muscles were dissected with a spinous process and lamina in a subperiosteal fashion.  We identified the existing posterior instrumentation extending from L3 to L5.  It was cleaned of soft tissue, and then we unlocked the 6 locking caps which were removed, we then removed the rods, and then remove the L5 pedicle screws.  The screw holes were filled with Surgifoam and Gelfoam to establish hemostasis.  We dissected around the arthritic changes at the L2-3 level.  Another x-ray was taken to confirm our localization localization. Dissection was then carried out laterally over the facet complex and the transverse processes of L2 and L3 were exposed and decorticated.  Decompression was begun using the high-speed drill and Kerrison punches. Dissection was carried out laterally including facetectomy and foraminotomies with decompression of the stenotic compression of the L2 and L3 nerve roots. Once the decompression stenotic compression of the thecal sac and exiting nerve roots was completed we proceeded with the posterior lumbar interbody arthrodesis.   The L2-3 annulus was examined, there was a subligamentous disc herniation seen on the left side corresponding to the MRI findings. The annulus was incised bilaterally and the disc space entered. A thorough discectomy was performed using pituitary rongeurs and curettes. Once the discectomy was completed we began to prepare the endplate surfaces removing the cartilaginous endplates surface. We then measured the height of the intervertebral disc space. We selected 8 x 25 x 4 AVS peek interbody implants.  The C-arm fluoroscope was then draped and brought in the field and we identified the pedicle entry points bilaterally at the L2 level. Each of the 2  pedicles was probed, we aspirated  bone marrow aspirate from the vertebral bodies, this was injected over a 10 cc and a 5cc strip of Vitoss BA. Then each of the pedicles was examined with the ball probe good bony surfaces were found and no bony cuts were found. Each of the pedicles was then tapped with a 5.25 mm tap, again examined with the ball probe good threading was found and no bony cuts were found. We then placed 5.75 by 45 millimeter screws bilaterally at the L2 level.  We then packed the AVS peek interbody implants with Vitoss BA with bone marrow aspirate nd infuse, and then placed the first implant and on the right side, carefully retracting the thecal sac and nerve root medially. We then went back to the left side placed a second implant and on the left side again retracting the thecal sac and nerve root medially.   We then packed the lateral gutter over the transverse processes and intertransverse space with locally harvested morcellized autograft, Vitoss BA with bone marrow aspirate, and infuse. We then selected 0 mm pre-lordosed rods, they were placed within the screw heads and secured with locking caps once all 6 locking caps were placed final tightening was performed against a counter torque.  The wound had been irrigated multiple times during the procedure with saline solution and bacitracin solution, good hemostasis was established with a combination of bipolar cautery and Gelfoam with thrombin. Once good hemostasis was confirmed we proceeded with closure paraspinal muscles deep fascia and Scarpa's fascia were closed with interrupted undyed 1 Vicryl sutures the subcutaneous and subcuticular closed with interrupted inverted 2-0 undyed Vicryl sutures the skin edges were approximated with Dermabond.  A dressing of sterile gauze and Hypafix was applied.  Following surgery the patient was turned back to the supine position to be reversed and the anesthetic extubated and transferred to the recovery  room for further care here she was noted to be moving all 4 extremities to command.    PLAN OF CARE: Admit for overnight observation  PATIENT DISPOSITION:  PACU - hemodynamically stable.   Delay start of Pharmacological VTE agent (>24hrs) due to surgical blood loss or risk of bleeding:  yes

## 2016-04-16 NOTE — H&P (Signed)
Subjective: Patient is a 60 y.o. female who is admitted for treatment of an progressive degeneration at the L2-3 level with disc herniation, translation of L2 relative to L3 with resulting low back pain extending down to the buttocks, thighs, legs bilaterally. She's become increasingly limited and disabled and activities in and around her home as well as away from her home. She is status post previous L3-L5 decompressive lumbar laminectomy, L3-4 and L4-5 with an L3-L5 posterior lateral arthrodesis in January 2011. Status post L5-S1 arthrodesis in July 1989. He is been treated with spinal injections, physical therapy, NSAIDs, muscle relaxants, and medications for neuropathic pain (gabapentin) without relief. She is admitted now for decompression at the L2-3 level including bilateral laminectomy, facetectomy, and foraminotomy, bilateral L2-3 posterior lumbar interbody arthrodesis with interbody implants and bone graft, bilateral L2-3 posterior lateral arthrodesis with posterior instrumentation and bone graft.   Patient Active Problem List   Diagnosis Date Noted  . Chronic neck pain 06/22/2015  . Arthritis 01/04/2013  . Insomnia 01/04/2013   Past Medical History:  Diagnosis Date  . Asthma    uses albuterol as needed "mostly weather induced".ALbuterol and Ventolin inhaler as needed  . Chronic back pain   . Chronic neck pain   . Complication of anesthesia   . Degenerative disc disease    cervical and lumbar area  . History of bronchitis 2016  . Insomnia    takes Ambien nightly  . Muscle spasm    takes Robaxin daily   . Pneumonia    hx of-2016  . PONV (postoperative nausea and vomiting)   . Weakness    numbness and tingling in legs    Past Surgical History:  Procedure Laterality Date  . BACK SURGERY     x 3 lumbar  . CHOLECYSTECTOMY    . DILATION AND CURETTAGE OF UTERUS     x 2  . KNEE SURGERY Left    x 2  . POSTERIOR CERVICAL FUSION/FORAMINOTOMY  05/17/2012   Procedure: POSTERIOR  CERVICAL FUSION/FORAMINOTOMY LEVEL 2;  Surgeon: Hewitt Shorts, MD;  Location: [facility];  Service: Neurosurgery;  Laterality: N/A;  cervical six to Thoracic One posterior cervical arthrodesis with instrumentation  . SHOULDER SURGERY     right  . SPINE SURGERY     x 5    No prescriptions prior to admission.   Allergies  Allergen Reactions  . No Known Allergies     Social History  Substance Use Topics  . Smoking status: Never Smoker  . Smokeless tobacco: Never Used  . Alcohol use No    History reviewed. No pertinent family history.   Review of Systems A comprehensive review of systems was negative.  EXAM: Patient well-developed well-nourished white female in no acute distress. Lungs are clear to auscultation , the patient has symmetrical respiratory excursion. Heart has a regular rate and rhythm normal S1 and S2 no murmur.   Abdomen is soft nontender nondistended bowel sounds are present. Extremity examination shows no clubbing cyanosis or edema. Motor examination shows 5 over 5 strength in the lower extremities including the iliopsoas quadriceps dorsiflexor extensor hallicus  longus and plantar flexor bilaterally. Sensation is intact to pinprick in the distal lower extremities. Reflexes are symmetrical bilaterally. No pathologic reflexes are present. Patient has a normal gait and stance.   Data Review:CBC    Component Value Date/Time   WBC 3.8 (L) 03/21/2016 0832   RBC 4.67 03/21/2016 0832   HGB 13.9 03/21/2016 0832   HCT 41.8  03/21/2016 0832   PLT 224 03/21/2016 0832   MCV 89.5 03/21/2016 0832   MCH 29.8 03/21/2016 0832   MCHC 33.3 03/21/2016 0832   RDW 12.5 03/21/2016 0832   LYMPHSABS 1.2 05/23/2008 1830   MONOABS 0.4 05/23/2008 1830   EOSABS 0.0 05/23/2008 1830   BASOSABS 0.0 05/23/2008 1830                          BMET    Component Value Date/Time   NA 141 03/21/2016 0832   K 3.6 03/21/2016 0832   CL 107 03/21/2016 0832   CO2 29 03/21/2016 0832   GLUCOSE  84 03/21/2016 0832   BUN 11 03/21/2016 0832   CREATININE 0.74 03/21/2016 0832   CALCIUM 9.5 03/21/2016 0832   GFRNONAA >60 03/21/2016 0832   GFRAA >60 03/21/2016 [id number]     Assessment/Plan: Patient with progressive degeneration at the L2-3 level with increasing disability who is admitted now for a L2-3 lumbar decompression and arthrodesis as described above.  I've discussed with the patient the nature of his condition, the nature the surgical procedure, the typical length of surgery, hospital stay, and overall recuperation, the limitations postoperatively, and risks of surgery. I discussed risks including risks of infection, bleeding, possibly need for transfusion, the risk of nerve root dysfunction with pain, weakness, numbness, or paresthesias, the risk of dural tear and CSF leakage and possible need for further surgery, the risk of failure of the arthrodesis and possibly for further surgery, the risk of anesthetic complications including myocardial infarction, stroke, pneumonia, and death. We discussed the need for postoperative immobilization in a lumbar brace. Understanding all this the patient does wish to proceed with surgery and is admitted for such.     Hewitt ShortsNUDELMAN,ROBERT W, MD 04/16/2016 8:58 AM

## 2016-04-16 NOTE — Transfer of Care (Signed)
Immediate Anesthesia Transfer of Care Note  Patient: Stephanie CostainJennifer L Noble  Procedure(s) Performed: Procedure(s) with comments: LUMBAR TWO - LUMBAR THREE  LUMBAR DECOMPRESSION, POSTERIOR LUMBAR INTERBODY FUSION, POSTERIOR LATERAL ARTHRODESIS (N/A) - LUMBAR TWO - LUMBAR THREE  LUMBAR DECOMPRESSION, POSTERIOR LUMBAR INTERBODY FUSION, POSTERIOR LATERAL ARTHRODESIS  Patient Location: PACU  Anesthesia Type:General  Level of Consciousness: awake, oriented, sedated and patient cooperative  Airway & Oxygen Therapy: Patient Spontanous Breathing and Patient connected to nasal cannula oxygen  Post-op Assessment: Report given to RN, Post -op Vital signs reviewed and stable, Patient moving all extremities and Patient moving all extremities X 4  Post vital signs: Reviewed and stable  Last Vitals:  Vitals:   04/16/16 0956  BP: 120/78  Pulse: 66  Resp: 20  Temp: 36.6 C    Last Pain:  Vitals:   04/16/16 0956  TempSrc: Oral  PainSc:       Patients Stated Pain Goal: 3 (04/16/16 0936)  Complications: No apparent anesthesia complications

## 2016-04-16 NOTE — Anesthesia Postprocedure Evaluation (Signed)
Anesthesia Post Note  Patient: Stephanie CostainJennifer L Noble  Procedure(s) Performed: Procedure(s) (LRB): LUMBAR TWO - LUMBAR THREE  LUMBAR DECOMPRESSION, POSTERIOR LUMBAR INTERBODY FUSION, POSTERIOR LATERAL ARTHRODESIS (N/A)  Patient location during evaluation: PACU Anesthesia Type: General Level of consciousness: awake and alert Pain management: pain level controlled Vital Signs Assessment: post-procedure vital signs reviewed and stable Respiratory status: spontaneous breathing, nonlabored ventilation, respiratory function stable and patient connected to nasal cannula oxygen Cardiovascular status: blood pressure returned to baseline and stable Postop Assessment: no signs of nausea or vomiting Anesthetic complications: no    Last Vitals:  Vitals:   04/16/16 1815 04/16/16 1823  BP:  125/79  Pulse: 63 (!) 58  Resp: (!) 22 17  Temp:      Last Pain:  Vitals:   04/16/16 1806  TempSrc:   PainSc: 8                  Reino KentJudd, Keshonda Monsour J

## 2016-04-16 NOTE — Consult Note (Signed)
PULMONARY / CRITICAL CARE MEDICINE   Name: NNEKA BLANDA MRN: [id number] DOB: July 12, 1955    ADMISSION DATE:  04/16/2016 CONSULTATION DATE:  04/16/2016  REFERRING MD:  Newell Coral  CHIEF COMPLAINT:  Cardiac arrest   HISTORY OF PRESENT ILLNESS:   60 year old female with a past medical history significant for asthma and degenerative disc disease underwent a lumbar decompression on 04/16/2016 and afterwards developed cardiac arrest. Pulmonary critical care medicine was consulted for further evaluation and management.  The procedure today was elective and the patient reports to me that yesterday she was in her usual state of health feeling well and was able to play with her grandkids. Today she underwent lumbar spinal surgery including removal of hardware, decompression of L2 and L3. The case ended at 35. Total EBL during the case was 200 mL's. Postoperatively the patient complained of pain and was administered analgesia in the form of narcotics as well as nonsteroidal anti-inflammatory medicines.  In the postoperative period she received Dilaudid a total of 4 mg in divided doses, Percocet 10/3250, oxycodone 10 mg, Zofran 8 mg, Robaxin, Toradol, Vistaril, Celebrex. The patient's family noted mild confusion prior to complete unresponsiveness this evening. She had a cardiac arrest and received approximately 4 minutes of CPR for a PEA arrest. She regained a pulse spontaneously. Narcan was administered. She was brought to the intensive care unit where she has been alert and oriented. She currently is complaining of pain and is requesting narcotics. She denies leg pain or swelling. Though she does note some soreness in her chest she says she feels like she has been hit there. She has some nausea.  PAST MEDICAL HISTORY :  She  has a past medical history of Asthma; Chronic back pain; Chronic neck pain; Complication of anesthesia; Degenerative disc disease; History of bronchitis (2016); Insomnia; Muscle  spasm; Pneumonia; PONV (postoperative nausea and vomiting); and Weakness.  PAST SURGICAL HISTORY: She  has a past surgical history that includes Spine surgery; Knee surgery (Left); Cholecystectomy; Shoulder surgery; Dilation and curettage of uterus; Posterior cervical fusion/foraminotomy (05/17/2012); and Back surgery.  Allergies  Allergen Reactions  . No Known Allergies     No current facility-administered medications on file prior to encounter.    Current Outpatient Prescriptions on File Prior to Encounter  Medication Sig  . albuterol (PROVENTIL) (2.5 MG/3ML) 0.083% nebulizer solution Take 3 mLs (2.5 mg total) by nebulization every 4 (four) hours as needed for wheezing or shortness of breath.  . celecoxib (CELEBREX) 200 MG capsule Take 200 mg by mouth daily.  Marland Kitchen gabapentin (NEURONTIN) 300 MG capsule Take 900 mg by mouth at bedtime.  . methocarbamol (ROBAXIN) 500 MG tablet Take 500 mg by mouth every 6 (six) hours as needed for muscle spasms.   . mupirocin ointment (BACTROBAN) 2 % APPLY TO AFFECTED AREA 3 TIMES A DAY AS NEEDED ON NOSE  . VENTOLIN HFA 108 (90 Base) MCG/ACT inhaler USE 2 PUFFS INTO LUNGS EVERY 6 HOURS AS NEEDED FOR SHORTNESS OF BREATH  . zolpidem (AMBIEN) 10 MG tablet TAKE 1/2 TO 1 TABLET BY MOUTH AT BEDTIME AS NEEDED FOR SLEEP  . HYDROcodone-acetaminophen (NORCO) 5-325 MG tablet Take 1-2 tablets by mouth every 6 (six) hours as needed for moderate pain. (Patient not taking: Reported on 04/14/2016)  . ketorolac (TORADOL) 10 MG tablet Take 1 tablet (10 mg total) by mouth every 8 (eight) hours. (Patient not taking: Reported on 04/14/2016)  . methocarbamol (ROBAXIN-750) 750 MG tablet Take 1 tablet (750 mg total) by mouth  4 (four) times daily. (Patient not taking: Reported on 04/14/2016)    FAMILY HISTORY:  Her has no family status information on file.    SOCIAL HISTORY: She  reports that she has never smoked. She has never used smokeless tobacco. She reports that she does not  drink alcohol or use drugs.  REVIEW OF SYSTEMS:   Gen: Denies fever, chills, weight change, fatigue, night sweats HEENT: Denies blurred vision, double vision, hearing loss, tinnitus, sinus congestion, rhinorrhea, sore throat, neck stiffness, dysphagia PULM: Denies shortness of breath, cough, sputum production, hemoptysis, wheezing CV: + chest pain, edema, orthopnea, paroxysmal nocturnal dyspnea, palpitations GI: Denies abdominal pain, nausea, vomiting, diarrhea, hematochezia, melena, constipation, change in bowel habits GU: Denies dysuria, hematuria, polyuria, oliguria, urethral discharge Endocrine: Denies hot or cold intolerance, polyuria, polyphagia or appetite change Derm: Denies rash, dry skin, scaling or peeling skin change Heme: Denies easy bruising, bleeding, bleeding gums Neuro: Denies headache, numbness, weakness, slurred speech, loss of memory or consciousness   SUBJECTIVE:  As above  VITAL SIGNS: BP 125/79   Pulse (!) 58   Temp 97.7 F (36.5 C) (Oral)   Resp 17   Ht 5\' 6"  (1.676 m)   Wt 72.1 kg (159 lb)   SpO2 97%   BMI 25.66 kg/m   HEMODYNAMICS:    VENTILATOR SETTINGS:    INTAKE / OUTPUT: I/O last 3 completed shifts: In: 2250 [I.V.:2250] Out: 750 [Urine:550; Blood:200]  PHYSICAL EXAMINATION: General:  Resting in bed, complains of pain Neuro:  Awake, alert, oriented 4, moves all 4 extremities HEENT:  Normocephalic atraumatic, oropharynx clear without edema, pupils pinpoint Cardiovascular:  Regular rate and rhythm without murmurs gallops or rubs Lungs:  Clear to auscultation bilaterally with normal respiratory effort Abdomen:  Bowel sounds positive, nontender nondistended Musculoskeletal:  Normal bulk and tone Skin:  No rash or skin breakdown  LABS:  BMET No results for input(s): NA, K, CL, CO2, BUN, CREATININE, GLUCOSE in the last 168 hours.  Electrolytes No results for input(s): CALCIUM, MG, PHOS in the last 168 hours.  CBC  Recent Labs Lab  04/16/16 0947  WBC 4.1  HGB 14.8  HCT 43.0  PLT 210    Coag's No results for input(s): APTT, INR in the last 168 hours.  Sepsis Markers No results for input(s): LATICACIDVEN, PROCALCITON, O2SATVEN in the last 168 hours.  ABG No results for input(s): PHART, PCO2ART, PO2ART in the last 168 hours.  Liver Enzymes No results for input(s): AST, ALT, ALKPHOS, BILITOT, ALBUMIN in the last 168 hours.  Cardiac Enzymes No results for input(s): TROPONINI, PROBNP in the last 168 hours.  Glucose  Recent Labs Lab 04/16/16 2002  GLUCAP 169*    Imaging Dg Lumbar Spine 2-3 Views  Result Date: 04/16/2016 CLINICAL DATA:  For lumbar spine fusion EXAM: LUMBAR SPINE - 2-3 VIEW COMPARISON:  For lumbar spine fusion FINDINGS: Two C-arm spot films were returned. These demonstrate placement of transpedicular screws at L3, L4, and L5 with interbody fusion plug at the L3-4 level. IMPRESSION: C-arm images demonstrate placement of transpedicular screws for posterior fusion at L3-4 and L4-5. Electronically Signed   By: Dwyane DeePaul  Barry M.D.   On: 04/16/2016 16:40   Dg Lumbar Spine 2-3 Views  Result Date: 04/16/2016 CLINICAL DATA:  For L2-3 lumbar decompression and interbody fusion posterior EXAM: LUMBAR SPINE - 2-3 VIEW COMPARISON:  MR lumbar spine of 03/07/2016 FINDINGS: Portable cross-table lateral view 1 from the operating room shows hardware for fusion from L3-L5. A needle is  positionedl posteriorly directed toward the posterior spinous process of L1 with a second needle beneath the spinous process of L2 affected toward the posterior elements of L3. A second portable cross-table lateral view shows and instrument directed toward the L2-3 interspace. IMPRESSION: Localization of L2-3 interspace on the last image obtained. Electronically Signed   By: Dwyane Dee M.D.   On: 04/16/2016 16:37   Dg C-arm 1-60 Min  Result Date: 04/16/2016 CLINICAL DATA:  Lumbar fusion. EXAM: DG C-ARM 61-120 MIN COMPARISON:   01/31/2016 . FINDINGS: Cortical screws noted lower lumbar spine. Difficult to number the spine due to positioning. And inter disc fusion device also noted. Surgical clamps noted . IMPRESSION: Postsurgical changes lumbar spine . Electronically Signed   By: Maisie Fus  Register   On: 04/16/2016 16:28     STUDIES:    CULTURES: None  ANTIBIOTICS: Cefazolin administered perioperatively  SIGNIFICANT EVENTS: 04/16/2016 elective lumbar spinal decompression, cardiac arrest later in the evening  LINES/TUBES: None  DISCUSSION: 60 year old female with a past medical history significant for degenerative spine disease was admitted to the ICU on 04/16/2016 after a PEA arrest several hours after a lumbar spinal surgery. Unfortunately she was non-telemetry at the time, based on the time course and administration of medicines prior the most likely explanation for her cardiac arrest is acute restaurant failure secondary to narcotic administration leading to hypoxemic induced PEA arrest. Fortunately, she is neurologically intact now. The differential diagnosis of her cardiac arrest does include arrhythmia, less likely coronary ischemia. Fortunately her EKG does not show findings worrisome for ischemia.   ASSESSMENT / PLAN:  PULMONARY A: Asthma, not in exacerbation P:   Monitor respiratory status in ICU Monitor O2 saturation As needed albuterol  CARDIOVASCULAR A:  Pulseless electrical activity cardiac arrest, see description above  P:  Telemetry monitoring Cycle cardiac enzymes Consider echocardiogram if cardiac enzymes abnormal Consider cardiology consultation if cardiac enzymes abnormal Minimize further narcotic administration  RENAL A:   No acute issues P:   Monitor BMET and UOP Replace electrolytes as needed   GASTROINTESTINAL A:   No acute issues P:   Advance diet as tolerated  HEMATOLOGIC A:   No acute issues P:  Check CBC Monitor for bleeding  INFECTIOUS A:   No acute  issues P:   Monitor for fever  ENDOCRINE A:   No acute issues   P:   Monitor glucose  NEUROLOGIC A:   Cardiac arrest post narcotic administration, suspect multiple narcotic medications led to brief respiratory arrest P:   Agree with low-dose morphine moving forward but would hold off on any further IV narcotic administration Use nonsteroidal anti-inflammatories as needed for pain I advised the patient this evening that we will need to minimize further IV narcotic meds overnight.    FAMILY  - Updates: updated by NSGY  - Inter-disciplinary family meet or Palliative Care meeting due by:  day 7  My cc time 40 minutes  Heber [place], MD [place] Pager: [phone] Cell: [phone] After 3pm or if no response, call [phone]   04/16/2016, 9:15 PM

## 2016-04-16 NOTE — Anesthesia Preprocedure Evaluation (Signed)
Anesthesia Evaluation  Patient identified by MRN, date of birth, ID band Patient awake    Reviewed: Allergy & Precautions, H&P , NPO status , Patient's Chart, lab work & pertinent test results  History of Anesthesia Complications (+) PONVNegative for: history of anesthetic complications  Airway Mallampati: II  TM Distance: >3 FB Neck ROM: full    Dental no notable dental hx.    Pulmonary asthma ,    Pulmonary exam normal breath sounds clear to auscultation       Cardiovascular negative cardio ROS Normal cardiovascular exam Rhythm:regular Rate:Normal     Neuro/Psych negative neurological ROS     GI/Hepatic negative GI ROS, Neg liver ROS,   Endo/Other  negative endocrine ROS  Renal/GU negative Renal ROS     Musculoskeletal  (+) Arthritis ,   Abdominal   Peds  Hematology negative hematology ROS (+)   Anesthesia Other Findings chronic back and neck pain  Reproductive/Obstetrics negative OB ROS                             Anesthesia Physical Anesthesia Plan  ASA: III  Anesthesia Plan: General   Post-op Pain Management:    Induction: Intravenous  Airway Management Planned: Oral ETT and Video Laryngoscope Planned  Additional Equipment:   Intra-op Plan:   Post-operative Plan: Extubation in OR  Informed Consent: I have reviewed the patients History and Physical, chart, labs and discussed the procedure including the risks, benefits and alternatives for the proposed anesthesia with the patient or authorized representative who has indicated his/her understanding and acceptance.   Dental Advisory Given  Plan Discussed with: Anesthesiologist, CRNA and Surgeon  Anesthesia Plan Comments:         Anesthesia Quick Evaluation

## 2016-04-16 NOTE — Progress Notes (Signed)
Vitals:   04/16/16 1807 04/16/16 1815 04/16/16 1823 04/16/16 1850  BP: 113/70  125/79   Pulse: (!) 50 63 (!) 58   Resp: 14 (!) 22 17   Temp:    98 F (36.7 C)  TempSrc:      SpO2: 100% 99% 97%   Weight:      Height:        CBC  Recent Labs  04/16/16 0947  WBC 4.1  HGB 14.8  HCT 43.0  PLT 210    Called by nursing staff because the patient had become unresponsive and was found to be asystolic. A code was called and was ongoing. I came to the hospital, and Dr. Jeral FruitBotero, who is on-call for our practice this evening, was also contacted and arrived. By the time he twisted arrived, the patient had recovered a spontaneous rhythm, had a large episode of vomiting, and did not require intubation.  On exam she is awake and alert, oriented to her name Stephanie Noble and 04/16/2016. She is following commands and moving all 4 extremities well. Her dressing was clean and dry, and intact. She is complaining of incisional pain, but denies any chest pain.  Nursing staff reports that prior to her becoming unresponsive she been given Percocet and Celebrex, and prior to that Vistaril for some itching.  Plan: Arrangements have been made to transfer the patient to the neurosurgical intensive care unit ([facility] [place]). I've spoken with [facility] and of requested a critical care medicine consultation, which they have assured me the patient will be seen this evening.  I spoke with the patient's husband, who is naturally distressed, and tried to reassure him that his wife is certainly much more stable right now, but that we need to try to assess what led to the code.  Dr. Jeral FruitBotero and I discussed the case. I will see the patient tomorrow, but will be out of town the following 3 days, he will see the patient on the 20th, and the on-call partners will see the patient over the coming weekend.  Hewitt ShortsNUDELMAN,ROBERT W, MD 04/16/2016, 8:40 PM

## 2016-04-16 NOTE — Anesthesia Procedure Notes (Signed)
Procedure Name: Intubation Date/Time: 04/16/2016 12:22 PM Performed by: Izora Gala Pre-anesthesia Checklist: Emergency Drugs available, Suction available, Patient identified and Patient being monitored Patient Re-evaluated:Patient Re-evaluated prior to inductionOxygen Delivery Method: Circle system utilized Preoxygenation: Pre-oxygenation with 100% oxygen Intubation Type: IV induction Ventilation: Mask ventilation without difficulty Laryngoscope Size: Miller and 3 Grade View: Grade I Tube type: Oral Tube size: 7.0 mm Number of attempts: 1 Airway Equipment and Method: Stylet,  Bite block and LTA kit utilized Placement Confirmation: ETT inserted through vocal cords under direct vision,  positive ETCO2 and breath sounds checked- equal and bilateral Secured at: 21 cm Dental Injury: Teeth and Oropharynx as per pre-operative assessment

## 2016-04-16 NOTE — Progress Notes (Signed)
Patient arrived to 3C05 via wheelchair @1855 , walked to bed with assistance and was c/o severe pain on her back and itching on her face. After patient got settled in bed, dinner was offered and PRN pain meds and vistaril for itching was given @1915 . The writer went to check patient with husband at bedside @1945  and patient was feeling better as verbalized by patient and husband, but patient was feeling sleepy at that time. @ 2000 the writer went to check on patient and husband stated "she just fell asleep, after a hard deep breath." Patient's lips were purplish, was unresponsive to any stimuli and was pulseless when checked by Clinical research associatewriter. Chest compressions immediately started, NT activated and called for code team and prepared crash cart in room. Code team arrived @2002 .

## 2016-04-17 DIAGNOSIS — I959 Hypotension, unspecified: Secondary | ICD-10-CM | POA: Diagnosis not present

## 2016-04-17 DIAGNOSIS — T402X5A Adverse effect of other opioids, initial encounter: Secondary | ICD-10-CM | POA: Diagnosis not present

## 2016-04-17 DIAGNOSIS — M5136 Other intervertebral disc degeneration, lumbar region: Secondary | ICD-10-CM | POA: Diagnosis present

## 2016-04-17 DIAGNOSIS — E876 Hypokalemia: Secondary | ICD-10-CM | POA: Diagnosis not present

## 2016-04-17 DIAGNOSIS — M47816 Spondylosis without myelopathy or radiculopathy, lumbar region: Secondary | ICD-10-CM | POA: Diagnosis present

## 2016-04-17 DIAGNOSIS — I468 Cardiac arrest due to other underlying condition: Secondary | ICD-10-CM | POA: Diagnosis not present

## 2016-04-17 DIAGNOSIS — T398X5A Adverse effect of other nonopioid analgesics and antipyretics, not elsewhere classified, initial encounter: Secondary | ICD-10-CM | POA: Diagnosis not present

## 2016-04-17 DIAGNOSIS — T411X5A Adverse effect of intravenous anesthetics, initial encounter: Secondary | ICD-10-CM | POA: Diagnosis present

## 2016-04-17 DIAGNOSIS — G8929 Other chronic pain: Secondary | ICD-10-CM | POA: Diagnosis present

## 2016-04-17 DIAGNOSIS — Z79899 Other long term (current) drug therapy: Secondary | ICD-10-CM | POA: Diagnosis not present

## 2016-04-17 DIAGNOSIS — M419 Scoliosis, unspecified: Secondary | ICD-10-CM | POA: Diagnosis present

## 2016-04-17 DIAGNOSIS — Y92230 Patient room in hospital as the place of occurrence of the external cause: Secondary | ICD-10-CM | POA: Diagnosis not present

## 2016-04-17 DIAGNOSIS — J45909 Unspecified asthma, uncomplicated: Secondary | ICD-10-CM | POA: Diagnosis present

## 2016-04-17 DIAGNOSIS — M5126 Other intervertebral disc displacement, lumbar region: Secondary | ICD-10-CM | POA: Diagnosis present

## 2016-04-17 DIAGNOSIS — E861 Hypovolemia: Secondary | ICD-10-CM | POA: Diagnosis not present

## 2016-04-17 DIAGNOSIS — I469 Cardiac arrest, cause unspecified: Secondary | ICD-10-CM | POA: Diagnosis not present

## 2016-04-17 DIAGNOSIS — I9589 Other hypotension: Secondary | ICD-10-CM | POA: Diagnosis not present

## 2016-04-17 DIAGNOSIS — G4734 Idiopathic sleep related nonobstructive alveolar hypoventilation: Secondary | ICD-10-CM | POA: Diagnosis not present

## 2016-04-17 DIAGNOSIS — K5900 Constipation, unspecified: Secondary | ICD-10-CM | POA: Diagnosis present

## 2016-04-17 DIAGNOSIS — R079 Chest pain, unspecified: Secondary | ICD-10-CM | POA: Diagnosis not present

## 2016-04-17 DIAGNOSIS — M48062 Spinal stenosis, lumbar region with neurogenic claudication: Secondary | ICD-10-CM | POA: Diagnosis present

## 2016-04-17 LAB — PHOSPHORUS: Phosphorus: 2.8 mg/dL (ref 2.5–4.6)

## 2016-04-17 LAB — CBC
HCT: 35.1 % — ABNORMAL LOW (ref 36.0–46.0)
Hemoglobin: 12 g/dL (ref 12.0–15.0)
MCH: 29.8 pg (ref 26.0–34.0)
MCHC: 34.2 g/dL (ref 30.0–36.0)
MCV: 87.1 fL (ref 78.0–100.0)
PLATELETS: 197 10*3/uL (ref 150–400)
RBC: 4.03 MIL/uL (ref 3.87–5.11)
RDW: 12.3 % (ref 11.5–15.5)
WBC: 8.6 10*3/uL (ref 4.0–10.5)

## 2016-04-17 LAB — HEPATIC FUNCTION PANEL
ALK PHOS: 39 U/L (ref 38–126)
ALT: 71 U/L — ABNORMAL HIGH (ref 14–54)
AST: 87 U/L — ABNORMAL HIGH (ref 15–41)
Albumin: 3.2 g/dL — ABNORMAL LOW (ref 3.5–5.0)
BILIRUBIN DIRECT: 0.1 mg/dL (ref 0.1–0.5)
BILIRUBIN INDIRECT: 0.4 mg/dL (ref 0.3–0.9)
BILIRUBIN TOTAL: 0.5 mg/dL (ref 0.3–1.2)
TOTAL PROTEIN: 5.6 g/dL — AB (ref 6.5–8.1)

## 2016-04-17 LAB — MAGNESIUM: MAGNESIUM: 1.6 mg/dL — AB (ref 1.7–2.4)

## 2016-04-17 LAB — TROPONIN I
TROPONIN I: 0.24 ng/mL — AB (ref <0.03)
Troponin I: 0.18 ng/mL (ref <0.03)

## 2016-04-17 LAB — LACTIC ACID, PLASMA: LACTIC ACID, VENOUS: 1.5 mmol/L (ref 0.5–1.9)

## 2016-04-17 MED ORDER — SODIUM CHLORIDE 0.9 % IV SOLN
INTRAVENOUS | Status: DC
  Administered 2016-04-17 – 2016-04-18 (×3): via INTRAVENOUS

## 2016-04-17 MED ORDER — SODIUM CHLORIDE 0.9 % IV BOLUS (SEPSIS)
1000.0000 mL | Freq: Once | INTRAVENOUS | Status: AC
  Administered 2016-04-17: 1000 mL via INTRAVENOUS

## 2016-04-17 MED ORDER — POTASSIUM CHLORIDE 10 MEQ/100ML IV SOLN
INTRAVENOUS | Status: AC
  Filled 2016-04-17: qty 400

## 2016-04-17 MED ORDER — DEXTROSE 5 % IV SOLN
30.0000 ug/min | INTRAVENOUS | Status: DC
  Administered 2016-04-17: 30 ug/min via INTRAVENOUS
  Administered 2016-04-17: 15 ug/min via INTRAVENOUS
  Administered 2016-04-17: 28 ug/min via INTRAVENOUS
  Administered 2016-04-18: 15 ug/min via INTRAVENOUS
  Filled 2016-04-17 (×6): qty 1

## 2016-04-17 MED ORDER — POTASSIUM CHLORIDE 10 MEQ/100ML IV SOLN
10.0000 meq | INTRAVENOUS | Status: AC
  Administered 2016-04-17 (×4): 10 meq via INTRAVENOUS

## 2016-04-17 MED ORDER — POTASSIUM CHLORIDE 10 MEQ/50ML IV SOLN: 10.0000 meq | INTRAVENOUS | Status: DC

## 2016-04-17 MED ORDER — MAGNESIUM SULFATE IN D5W 1-5 GM/100ML-% IV SOLN
1.0000 g | Freq: Once | INTRAVENOUS | Status: AC
Start: 2016-04-17 — End: 2016-04-17
  Administered 2016-04-17: 1 g via INTRAVENOUS
  Filled 2016-04-17: qty 100

## 2016-04-17 MED FILL — Heparin Sodium (Porcine) Inj 1000 Unit/ML: INTRAMUSCULAR | Qty: 30 | Status: AC

## 2016-04-17 MED FILL — Medication: Qty: 1 | Status: AC

## 2016-04-17 MED FILL — Sodium Chloride IV Soln 0.9%: INTRAVENOUS | Qty: 1000 | Status: AC

## 2016-04-17 NOTE — Care Management Note (Signed)
Case Management Note  Patient Details  Name: Stephanie Noble MRN: [id number] Date of Birth: 11/01/1955  Subjective/Objective:   Pt admitted on 04/16/16 s/p L2-L3 PLIF with post op cardiac arrest.  PTA, pt independent, lives with spouse.                   Action/Plan: Will follow for discharge planning as pt progresses.    Expected Discharge Date:                  Expected Discharge Plan:  Home w Home Health Services  In-House Referral:     Discharge planning Services  CM Consult  Post Acute Care Choice:    Choice offered to:     DME Arranged:    DME Agency:     HH Arranged:    HH Agency:     Status of Service:  In process, will continue to follow  If discussed at Long Length of Stay Meetings, dates discussed:    Additional Comments:  Quintella BatonJulie W. Breland Trouten, RN, BSN  Trauma/Neuro ICU Case Manager [phone]

## 2016-04-17 NOTE — Progress Notes (Signed)
PCCM PROGRESS NOTE  Admission date: 04/16/2016 Consult date: 04/16/2016 Referring provider: Dr. Jule Ser  CC: Cardiac arrest  Subjective: Has soreness in rib cage.  Vital signs: BP (!) 96/52   Pulse 73   Temp 98.8 F (37.1 C) (Oral)   Resp 18   Ht 5\' 6"  (1.676 m)   Wt 159 lb (72.1 kg)   SpO2 100%   BMI 25.66 kg/m   Intake/output: I/O last 3 completed shifts: In: 5869.1 [I.V.:5469.1; IV Piggyback:400] Out: 2600 [Urine:2400; Blood:200]  General: alert Neuro: normal strength HEENT: pupils reactive, no stridor Cardiac: regular, no murmur Chest: no wheeze/rales Abd: soft, non tender Ext: no edema Skin: no rashes   CMP Latest Ref Rng & Units 04/17/2016 04/16/2016 03/21/2016  Glucose 65 - 99 mg/dL - 696(E) 84  BUN 6 - 20 mg/dL - 8 11  Creatinine 9.52 - 1.00 mg/dL - 8.41 3.24  Sodium 401 - 145 mmol/L - 136 141  Potassium 3.5 - 5.1 mmol/L - 2.9(L) 3.6  Chloride 101 - 111 mmol/L - 104 107  CO2 22 - 32 mmol/L - 21(L) 29  Calcium 8.9 - 10.3 mg/dL - 8.2(L) 9.5  Total Protein 6.5 - 8.1 g/dL 0.2(V) 2.5(D) -  Total Bilirubin 0.3 - 1.2 mg/dL 0.5 0.4 -  Alkaline Phos 38 - 126 U/L 39 40 -  AST 15 - 41 U/L 87(H) 62(H) -  ALT 14 - 54 U/L 71(H) 42 -    CBC Latest Ref Rng & Units 04/17/2016 04/16/2016 03/21/2016  WBC 4.0 - 10.5 K/uL 8.6 4.1 3.8(L)  Hemoglobin 12.0 - 15.0 g/dL 66.4 40.3 47.4  Hematocrit 36.0 - 46.0 % 35.1(L) 43.0 41.8  Platelets 150 - 400 K/uL 197 210 224    Cardiac Panel (last 3 results)  Recent Labs  04/16/16 2204 04/17/16 0551  TROPONINI <0.03 0.24*    Imaging Dg Lumbar Spine 2-3 Views  Result Date: 04/16/2016 CLINICAL DATA:  For lumbar spine fusion EXAM: LUMBAR SPINE - 2-3 VIEW COMPARISON:  For lumbar spine fusion FINDINGS: Two C-arm spot films were returned. These demonstrate placement of transpedicular screws at L3, L4, and L5 with interbody fusion plug at the L3-4 level. IMPRESSION: C-arm images demonstrate placement of transpedicular screws for  posterior fusion at L3-4 and L4-5. Electronically Signed   By: Dwyane Dee M.D.   On: 04/16/2016 16:40   Dg Lumbar Spine 2-3 Views  Result Date: 04/16/2016 CLINICAL DATA:  For L2-3 lumbar decompression and interbody fusion posterior EXAM: LUMBAR SPINE - 2-3 VIEW COMPARISON:  MR lumbar spine of 03/07/2016 FINDINGS: Portable cross-table lateral view 1 from the operating room shows hardware for fusion from L3-L5. A needle is positionedl posteriorly directed toward the posterior spinous process of L1 with a second needle beneath the spinous process of L2 affected toward the posterior elements of L3. A second portable cross-table lateral view shows and instrument directed toward the L2-3 interspace. IMPRESSION: Localization of L2-3 interspace on the last image obtained. Electronically Signed   By: Dwyane Dee M.D.   On: 04/16/2016 16:37   Dg Chest Port 1 View  Result Date: 04/17/2016 CLINICAL DATA:  60 year old female with cardiac arrest and chest pain. EXAM: PORTABLE CHEST 1 VIEW COMPARISON:  None. FINDINGS: The lungs are clear. There is no pleural effusion or pneumothorax. The cardiac silhouette is within normal limits. Lower cervical posterior fusion screws and fixation plate. No acute osseous pathology. IMPRESSION: No active disease. Electronically Signed   By: Elgie Collard M.D.   On: 04/17/2016 02:15  Dg C-arm 1-60 Min  Result Date: 04/16/2016 CLINICAL DATA:  Lumbar fusion. EXAM: DG C-ARM 61-120 MIN COMPARISON:  01/31/2016 . FINDINGS: Cortical screws noted lower lumbar spine. Difficult to number the spine due to positioning. And inter disc fusion device also noted. Surgical clamps noted . IMPRESSION: Postsurgical changes lumbar spine . 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   She had b/l L2-3 lumbar laminectomy 10/18 with EBL of 200 ml.  Post-op she developed hypotension, facial itching, pallor and then PEA cardiac arrest with 5 minutes of CPR before ROSC.  She had received several medications with sedating properties in the post-operative period.  Assessment/plan:  PEA cardiac arrest most likely from hypovolemia, residual effects of anesthesia, and sedating medications. - wean pressors to keep SBP > 90, MAP > 65 - continue IV fluids - f/u Echo - ?allergic reaction >> no evidence for rash/angioedema  Neurogenic claudication s/p lumbar laminectomy. - post op care per neurosurgery - limit sedating medications as able  Hypokalemia, hypomagnesemia. - replace electrolytes as needed  Hx of asthma. - prn albuterol  Updated pt's husband at bedside.  CC time 33 minutes.  Coralyn HellingVineet Eura Radabaugh, MD [facility] 04/17/2016, 11:22 AM Pager:  [phone] After 3pm call: [phone]

## 2016-04-17 NOTE — Progress Notes (Signed)
eLink Physician-Brief Progress Note Patient Name: Stephanie CostainJennifer L Covault DOB: 12/11/1955 MRN: [id number]   Date of Service  04/17/2016  HPI/Events of Note  stil low bp  eICU Interventions  Check lactate Check trop Start neo  Via piv     Intervention Category Major Interventions: Hypotension - evaluation and management  Fizza Scales 04/17/2016, 4:05 AM

## 2016-04-17 NOTE — Progress Notes (Signed)
eLink Physician-Brief Progress Note Patient Name: Stephanie CostainJennifer L Noble DOB: 12/12/1955 MRN: [id number]   Date of Service  04/17/2016  HPI/Events of Note  MAP 50s afer 1L fluid. Easily arousable per RN. Home baseline 120sbp per rn  eICU Interventions  1 more liter fluid bolus     Intervention Category Major Interventions: Hypotension - evaluation and management  Meagon Duskin 04/17/2016, 2:22 AM

## 2016-04-17 NOTE — Progress Notes (Signed)
CRITICAL VALUE ALERT  Critical value received:  Troponin 0.24  Date of notification:  04/17/2016  Time of notification:  0755  Critical value read back:Yes.    Nurse who received alert:  Earnest RosierKatelyn Roop RN  MD notified (1st page):  Dr. Craige CottaSood  Time of first page:  0755  Time MD responded:  0755  No new orders at this time.  Will continue to monitor.

## 2016-04-17 NOTE — Progress Notes (Signed)
Subjective: Patient resting in bed, complaining of back pain, but denies other complaints. Specifically no dyspnea, shortness of breath, or chest pain. Receiving replacement for potassium. On low dose Neo-Synephrine drip for mild hypotension.  Objective: Vital signs in last 24 hours: Vitals:   04/17/16 0700 04/17/16 0715 04/17/16 0730 04/17/16 0745  BP: (!) 84/51 104/63 (!) 96/58 (!) 93/59  Pulse: 70 (!) 59 (!) 57 60  Resp: 10 17 11 10   Temp:      TempSrc:      SpO2: 100% 100% 100% 100%  Weight:      Height:        Intake/Output from previous day: 10/18 0701 - 10/19 0700 In: 5869.1 [I.V.:5469.1; IV Piggyback:400] Out: 2600 [Urine:2400; Blood:200] Intake/Output this shift: No intake/output data recorded.  Physical Exam:  Awake and alert, oriented. Moving all 4 extremities well. Dressing dry and intact.  CBC  Recent Labs  04/16/16 0947 04/17/16 0551  WBC 4.1 8.6  HGB 14.8 12.0  HCT 43.0 35.1*  PLT 210 197   BMET  Recent Labs  04/16/16 2204  NA 136  K 2.9*  CL 104  CO2 21*  GLUCOSE 275*  BUN 8  CREATININE 0.86  CALCIUM 8.2*    Assessment/Plan: Stable overnight. Cause of code last night uncertain, but patient seems to be doing well now. We'll begin out of bed to chair, and if feasible begin to ambulate in the ICU with the staff.   Hewitt ShortsNUDELMAN,ROBERT W, MD 04/17/2016, 8:28 AM

## 2016-04-17 NOTE — Progress Notes (Signed)
Cam care  HR 79, pulse ox 100, RR 12 and BP 86 sbp with map 64  Resting comfortabley   PULMONARY No results for input(s): PHART, PCO2ART, PO2ART, HCO3, TCO2, O2SAT in the last 168 hours.  Invalid input(s): PCO2, PO2  CBC  Recent Labs Lab 04/16/16 0947  HGB 14.8  HCT 43.0  WBC 4.1  PLT 210    COAGULATION No results for input(s): INR in the last 168 hours.  CARDIAC   Recent Labs Lab 04/16/16 2204  TROPONINI <0.03   No results for input(s): PROBNP in the last 168 hours.   CHEMISTRY  Recent Labs Lab 04/16/16 2204  NA 136  K 2.9*  CL 104  CO2 21*  GLUCOSE 275*  BUN 8  CREATININE 0.86  CALCIUM 8.2*   Estimated Creatinine Clearance: 70.7 mL/min (by C-G formula based on SCr of 0.86 mg/dL).   LIVER  Recent Labs Lab 04/16/16 2204  AST 62*  ALT 42  ALKPHOS 40  BILITOT 0.4  PROT 5.4*  ALBUMIN 3.2*     INFECTIOUS No results for input(s): LATICACIDVEN, PROCALCITON in the last 168 hours.   ENDOCRINE CBG (last 3)   Recent Labs  04/16/16 2002  GLUCAP 169*     XCXr port - result pending   A)  post cpr mild hypiotension Low K   Plan  - 1L fluid bolus - kcl 10meq x 4 runs, check mag and phos  - await cxr results  - monitor  Dr. Kalman ShanMurali Hena Ewalt, M.D., [facility] Pulmonary and Critical Care Medicine Staff Physician [facility] [place] [facility] Pager: [phone], If no answer or between  15:00h - 7:00h: call [phone]  04/17/2016 12:13 AM

## 2016-04-17 NOTE — Progress Notes (Signed)
Orthopedic Tech Progress Note Patient Details:  Davene CostainJennifer L Peifer 04/30/1956 [id number]  Patient ID: Davene CostainJennifer L Foxworthy, female   DOB: 11/30/1955, 60 y.o.   MRN: [id number]   Nikki DomCrawford, Jakeira Seeman 04/17/2016, 10:50 AM Called in bio-tech brace order; spoke with Judeth CornfieldStephanie

## 2016-04-18 ENCOUNTER — Other Ambulatory Visit (HOSPITAL_COMMUNITY): Payer: BLUE CROSS/BLUE SHIELD

## 2016-04-18 LAB — BASIC METABOLIC PANEL
Anion gap: 3 — ABNORMAL LOW (ref 5–15)
BUN: 5 mg/dL — AB (ref 6–20)
CO2: 25 mmol/L (ref 22–32)
CREATININE: 0.67 mg/dL (ref 0.44–1.00)
Calcium: 7.8 mg/dL — ABNORMAL LOW (ref 8.9–10.3)
Chloride: 111 mmol/L (ref 101–111)
GFR calc Af Amer: >60 mL/min (ref >60)
GLUCOSE: 108 mg/dL — AB (ref 65–99)
POTASSIUM: 3.5 mmol/L (ref 3.5–5.1)
Sodium: 139 mmol/L (ref 135–145)

## 2016-04-18 LAB — CBC
HEMATOCRIT: 30.2 % — AB (ref 36.0–46.0)
Hemoglobin: 10 g/dL — ABNORMAL LOW (ref 12.0–15.0)
MCH: 29.9 pg (ref 26.0–34.0)
MCHC: 33.1 g/dL (ref 30.0–36.0)
MCV: 90.1 fL (ref 78.0–100.0)
Platelets: 164 10*3/uL (ref 150–400)
RBC: 3.35 MIL/uL — ABNORMAL LOW (ref 3.87–5.11)
RDW: 12.8 % (ref 11.5–15.5)
WBC: 7.7 10*3/uL (ref 4.0–10.5)

## 2016-04-18 LAB — PHOSPHORUS: PHOSPHORUS: 2.3 mg/dL — AB (ref 2.5–4.6)

## 2016-04-18 LAB — LACTIC ACID, PLASMA: LACTIC ACID, VENOUS: 0.9 mmol/L (ref 0.5–1.9)

## 2016-04-18 LAB — MAGNESIUM: MAGNESIUM: 1.9 mg/dL (ref 1.7–2.4)

## 2016-04-18 MED ORDER — SENNOSIDES-DOCUSATE SODIUM 8.6-50 MG PO TABS
1.0000 | ORAL_TABLET | Freq: Two times a day (BID) | ORAL | Status: DC
  Administered 2016-04-18 – 2016-04-20 (×5): 1 via ORAL
  Filled 2016-04-18 (×5): qty 1

## 2016-04-18 MED ORDER — POLYETHYLENE GLYCOL 3350 17 G PO PACK
17.0000 g | PACK | Freq: Every day | ORAL | Status: DC
  Administered 2016-04-18 – 2016-04-20 (×3): 17 g via ORAL
  Filled 2016-04-18 (×3): qty 1

## 2016-04-18 MED ORDER — ONDANSETRON HCL 4 MG/2ML IJ SOLN: 4.0000 mg | Freq: Four times a day (QID) | INTRAMUSCULAR | Status: DC | PRN

## 2016-04-18 NOTE — Progress Notes (Signed)
Patient ID: Stephanie CostainJennifer L Noble, female   DOB: 05/04/1956, 60 y.o.   MRN: [id number]  BP 125/67   Pulse 81   Temp 99.3 F (37.4 C) (Axillary)   Resp (!) 24   Ht 5\' 6"  (1.676 m)   Wt 72.1 kg (159 lb)   SpO2 100%   BMI 25.66 kg/m  Alert and oriented Moving well with pt Wound is clean, dry, no signs of infection

## 2016-04-18 NOTE — Evaluation (Signed)
Physical Therapy Evaluation Patient Details Name: Stephanie Noble MRN: [id number] DOB: 09/28/1955 Today's Date: 04/18/2016   History of Present Illness  Pt admitted on 04/16/16 s/p L2-L3 PLIF with post op cardiac arrest.  Clinical Impression  Patient seen for mobility assessment and education s/p spinal surgery. Patient is mobilizing well, receptive to education and will have spouse at home to assist 24/7 as needed. No further acute PT needs at this time. Will sign off.    Follow Up Recommendations No PT follow up;Supervision - Intermittent    Equipment Recommendations  None recommended by PT    Recommendations for Other Services       Precautions / Restrictions Precautions Precautions: Back Precaution Booklet Issued: Yes (comment) Precaution Comments: reviewed with patient Required Braces or Orthoses: Spinal Brace Spinal Brace: Lumbar corset Restrictions Weight Bearing Restrictions: No      Mobility  Bed Mobility Overal bed mobility: Needs Assistance Bed Mobility: Sidelying to Sit;Sit to Sidelying   Sidelying to sit: Supervision     Sit to sidelying: Supervision General bed mobility comments: VCs for technique  Transfers Overall transfer level: Needs assistance   Transfers: Sit to/from Stand Sit to Stand: Supervision         General transfer comment: no physical assist required, cues for safety  Ambulation/Gait Ambulation/Gait assistance: Supervision Ambulation Distance (Feet): 320 Feet Assistive device: None Gait Pattern/deviations: Step-through pattern;Decreased stride length Gait velocity: decreased   General Gait Details: VCs for upright posture and increased cadence  Stairs            Wheelchair Mobility    Modified Rankin (Stroke Patients Only)       Balance Overall balance assessment: No apparent balance deficits (not formally assessed)                                           Pertinent Vitals/Pain Pain  Assessment: Faces Faces Pain Scale: Hurts even more Pain Location: low back Pain Descriptors / Indicators: Sore;Guarding Pain Intervention(s): Monitored during session    Home Living Family/patient expects to be discharged to:: Private residence Living Arrangements: Spouse/significant other Available Help at Discharge: Family;Available 24 hours/day Type of Home: House Home Access: Stairs to enter Entrance Stairs-Rails: Right Entrance Stairs-Number of Steps: 5 Home Layout: Able to live on main level with bedroom/bathroom Home Equipment: Cane - single point      Prior Function Level of Independence: Independent         Comments: ocassional use of cane on "bad days"     Hand Dominance   Dominant Hand: Right    Extremity/Trunk Assessment   Upper Extremity Assessment: Overall WFL for tasks assessed           Lower Extremity Assessment: Overall WFL for tasks assessed      Cervical / Trunk Assessment:  (s/p spinal surgery)  Communication   Communication: No difficulties  Cognition Arousal/Alertness: Awake/alert Behavior During Therapy: WFL for tasks assessed/performed Overall Cognitive Status: Within Functional Limits for tasks assessed                      General Comments General comments (skin integrity, edema, etc.): educated regarding precautions, mobility expectations, safety, and pain management upon discharge. Discussed technique for car transfers, deferred stairs at this time dur to Neo drip running    Exercises     Assessment/Plan    PT Assessment  Patent does not need any further PT services  PT Problem List            PT Treatment Interventions      PT Goals (Current goals can be found in the Care Plan section)  Acute Rehab PT Goals Patient Stated Goal: to go home PT Goal Formulation: All assessment and education complete, DC therapy    Frequency     Barriers to discharge        Co-evaluation               End of  Session Equipment Utilized During Treatment: Gait belt;Back brace Activity Tolerance: Patient tolerated treatment well Patient left: in bed;with call bell/phone within reach;with family/visitor present Nurse Communication: Mobility status         Time: 1031-1100 PT Time Calculation (min) (ACUTE ONLY): 29 min   Charges:   PT Evaluation $PT Eval Moderate Complexity: 1 Procedure PT Treatments $Gait Training: 8-22 mins   PT G CodesFabio Asa:        Ananias Kolander J 04/18/2016, 1:19 PM Charlotte Crumbevon Mayan Dolney, PT DPT  [phone]

## 2016-04-18 NOTE — Evaluation (Signed)
Occupational Therapy Evaluation Patient Details Name: Stephanie CostainJennifer L Noble MRN: [id number] DOB: 11/06/1955 Today's Date: 04/18/2016    History of Present Illness Pt admitted on 04/16/16 s/p L2-L3 PLIF with post op cardiac arrest.  Past Medical History:  Diagnosis Date  . Asthma    uses albuterol as needed "mostly weather induced".ALbuterol and Ventolin inhaler as needed  . Chronic back pain   . Chronic neck pain   . Complication of anesthesia   . Degenerative disc disease    cervical and lumbar area  . History of bronchitis 2016  . Insomnia    takes Ambien nightly  . Muscle spasm    takes Robaxin daily   . Pneumonia    hx of-2016  . PONV (postoperative nausea and vomiting)   . Weakness    numbness and tingling in legs      Clinical Impression   Patient is s/pPLIF L2-3 surgery resulting in functional limitations due to the deficits listed below (see OT problem list). Pt with cardiac arrest on 04/17/16 Patient will benefit from skilled OT acutely to increase independence and safety with ADLS to allow discharge home without followup and 3n1 .     Follow Up Recommendations  No OT follow up    Equipment Recommendations  3 in 1 bedside comode    Recommendations for Other Services       Precautions / Restrictions Precautions Precautions: Back Precaution Booklet Issued: Yes (comment) Precaution Comments: back handout present and reviewed for adls and precautions Required Braces or Orthoses: Spinal Brace Spinal Brace: Lumbar corset Restrictions Weight Bearing Restrictions: No      Mobility Bed Mobility Overal bed mobility: Needs Assistance Bed Mobility: Rolling;Sit to Supine;Supine to Sit Rolling: Min guard Sidelying to sit: Supervision Supine to sit: Min assist Sit to supine: Mod assist Sit to sidelying: Supervision General bed mobility comments: pt requires (A) to elevated trunk off bed and (A) with bil LE to return to supine  Transfers Overall transfer  level: Needs assistance   Transfers: Sit to/from Stand Sit to Stand: Min assist         General transfer comment: required (A) to power up     Balance Overall balance assessment: No apparent balance deficits (not formally assessed)                                          ADL Overall ADL's : Needs assistance/impaired Eating/Feeding: Modified independent   Grooming: Wash/dry hands;Min guard Grooming Details (indicate cue type and reason): sink level andc/o hip pain Upper Body Bathing: Minimal assitance;Sitting     Lower Body Bathing Details (indicate cue type and reason): able to cross bil LE supine but c/o hip pain after ward question muscle spasms Upper Body Dressing : Min guard;Sitting Upper Body Dressing Details (indicate cue type and reason): don back brace in standing with min (A) for transfer   Lower Body Dressing Details (indicate cue type and reason): educated on sitting vs laying positiong. pt will have spouise (A) too Toilet Transfer: Minimal assistance Toilet Transfer Details (indicate cue type and reason): used 3n1 and needed hand rest for support Toileting- Clothing Manipulation and Hygiene: Min Aeronautical engineerguard;Sitting/lateral lean     Tub/Shower Transfer Details (indicate cue type and reason): defer to next treatment due to hip pain Functional mobility during ADLs: Minimal assistance General ADL Comments: Pt and spouse very concerned with transfer to  ICU and requesting to speak to MD     Vision     Perception     Praxis      Pertinent Vitals/Pain Pain Assessment: Faces Faces Pain Scale: Hurts whole lot Pain Location: hip area pain Pain Descriptors / Indicators: Grimacing Pain Intervention(s): Limited activity within patient's tolerance;Monitored during session;Premedicated before session;Repositioned     Hand Dominance Right   Extremity/Trunk Assessment Upper Extremity Assessment Upper Extremity Assessment: Overall WFL for tasks  assessed   Lower Extremity Assessment Lower Extremity Assessment: Defer to PT evaluation   Cervical / Trunk Assessment Cervical / Trunk Assessment: Other exceptions Cervical / Trunk Exceptions: s/p back surg   Communication Communication Communication: No difficulties   Cognition Arousal/Alertness: Awake/alert Behavior During Therapy: WFL for tasks assessed/performed Overall Cognitive Status: Within Functional Limits for tasks assessed                     General Comments       Exercises       Shoulder Instructions      Home Living Family/patient expects to be discharged to:: Private residence Living Arrangements: Spouse/significant other Available Help at Discharge: Family;Available 24 hours/day Type of Home: House Home Access: Stairs to enter [employer] of Steps: 5 Entrance Stairs-Rails: Right Home Layout: Able to live on main level with bedroom/bathroom     Bathroom Shower/Tub: Chief Strategy Officer: Standard     Home Equipment: Cane - single point   Additional Comments: spouse to (A) at home      Prior Functioning/Environment Level of Independence: Independent        Comments: ocassional use of cane on "bad days"        OT Problem List: Decreased strength;Decreased activity tolerance;Impaired balance (sitting and/or standing);Decreased safety awareness;Decreased knowledge of precautions;Decreased knowledge of use of DME or AE   OT Treatment/Interventions: Self-care/ADL training;Therapeutic exercise;DME and/or AE instruction;Therapeutic activities;Patient/family education;Balance training    OT Goals(Current goals can be found in the care plan section) Acute Rehab OT Goals Patient Stated Goal: to go home OT Goal Formulation: With patient Time For Goal Achievement: 05/02/16 Potential to Achieve Goals: Good  OT Frequency: Min 2X/week   Barriers to D/C:            Co-evaluation              End of Session  Equipment Utilized During Treatment: Gait belt;Back brace Nurse Communication: Mobility status;Precautions  Activity Tolerance: Patient tolerated treatment well Patient left: in bed;with call bell/phone within reach;with family/visitor present;with SCD's reapplied;with bed alarm set   Time: 4098-1191 OT Time Calculation (min): 20 min Charges:  OT General Charges $OT Visit: 1 Procedure OT Evaluation $OT Eval Moderate Complexity: 1 Procedure G-Codes:    Boone Master B 05/18/2016, 4:40 PM  Mateo Flow   OTR/L Pager: [phone] Office: [phone] .

## 2016-04-18 NOTE — Progress Notes (Signed)
PCCM PROGRESS NOTE  Admission date: 04/16/2016 Consult date: 04/16/2016 Referring provider: Dr. Jule SerNudleman  CC: Cardiac arrest  Subjective: C/o constipation.  Vital signs: BP (!) 123/58   Pulse 67   Temp 97.9 F (36.6 C) (Oral)   Resp 16   Ht 5\' 6"  (1.676 m)   Wt 159 lb (72.1 kg)   SpO2 100%   BMI 25.66 kg/m   Intake/output: I/O last 3 completed shifts: In: 7295.4 [I.V.:5795.4; IV Piggyback:1500] Out: 4655 [Urine:4655]  General: alert Neuro: normal strength HEENT: pupils reactive, no stridor Cardiac: regular, no murmur Chest: no wheeze/rales Abd: soft, non tender Ext: no edema Skin: no rashes   CMP Latest Ref Rng & Units 04/18/2016 04/17/2016 04/16/2016  Glucose 65 - 99 mg/dL 161(W108(H) - 960(A275(H)  BUN 6 - 20 mg/dL 5(L) - 8  Creatinine 5.400.44 - 1.00 mg/dL 9.810.67 - 1.910.86  Sodium 478135 - 145 mmol/L 139 - 136  Potassium 3.5 - 5.1 mmol/L 3.5 - 2.9(L)  Chloride 101 - 111 mmol/L 111 - 104  CO2 22 - 32 mmol/L 25 - 21(L)  Calcium 8.9 - 10.3 mg/dL 7.8(L) - 8.2(L)  Total Protein 6.5 - 8.1 g/dL - 5.6(L) 5.4(L)  Total Bilirubin 0.3 - 1.2 mg/dL - 0.5 0.4  Alkaline Phos 38 - 126 U/L - 39 40  AST 15 - 41 U/L - 87(H) 62(H)  ALT 14 - 54 U/L - 71(H) 42    CBC Latest Ref Rng & Units 04/18/2016 04/17/2016 04/16/2016  WBC 4.0 - 10.5 K/uL 7.7 8.6 4.1  Hemoglobin 12.0 - 15.0 g/dL 10.0(L) 12.0 14.8  Hematocrit 36.0 - 46.0 % 30.2(L) 35.1(L) 43.0  Platelets 150 - 400 K/uL 164 197 210    Cardiac Panel (last 3 results)  Recent Labs  04/16/16 2204 04/17/16 0551 04/17/16 0939  TROPONINI <0.03 0.24* 0.18*    Imaging Dg Lumbar Spine 2-3 Views  Result Date: 04/16/2016 CLINICAL DATA:  For lumbar spine fusion EXAM: LUMBAR SPINE - 2-3 VIEW COMPARISON:  For lumbar spine fusion FINDINGS: Two C-arm spot films were returned. These demonstrate placement of transpedicular screws at L3, L4, and L5 with interbody fusion plug at the L3-4 level. IMPRESSION: C-arm images demonstrate placement of  transpedicular screws for posterior fusion at L3-4 and L4-5. Electronically Signed   By: Dwyane DeePaul  Barry M.D.   On: 04/16/2016 16:40   Dg Lumbar Spine 2-3 Views  Result Date: 04/16/2016 CLINICAL DATA:  For L2-3 lumbar decompression and interbody fusion posterior EXAM: LUMBAR SPINE - 2-3 VIEW COMPARISON:  MR lumbar spine of 03/07/2016 FINDINGS: Portable cross-table lateral view 1 from the operating room shows hardware for fusion from L3-L5. A needle is positionedl posteriorly directed toward the posterior spinous process of L1 with a second needle beneath the spinous process of L2 affected toward the posterior elements of L3. A second portable cross-table lateral view shows and instrument directed toward the L2-3 interspace. IMPRESSION: Localization of L2-3 interspace on the last image obtained. Electronically Signed   By: Dwyane DeePaul  Barry M.D.   On: 04/16/2016 16:37   Dg Chest Port 1 View  Result Date: 04/17/2016 CLINICAL DATA:  60 year old female with cardiac arrest and chest pain. EXAM: PORTABLE CHEST 1 VIEW COMPARISON:  None. FINDINGS: The lungs are clear. There is no pleural effusion or pneumothorax. The cardiac silhouette is within normal limits. Lower cervical posterior fusion screws and fixation plate. No acute osseous pathology. IMPRESSION: No active disease. Electronically Signed   By: Elgie CollardArash  Radparvar M.D.   On: 04/17/2016 02:15  Dg C-arm 1-60 Min  Result Date: 04/16/2016 CLINICAL DATA:  Lumbar fusion. EXAM: DG C-ARM 61-120 MIN COMPARISON:  01/31/2016 . FINDINGS: Cortical screws noted lower lumbar spine. Difficult to number the spine due to positioning. And inter disc fusion device also noted. Surgical clamps noted . IMPRESSION: Postsurgical changes lumbar spine . Electronically Signed   By: Maisie Fus  Register   On: 04/16/2016 16:28   Events: 10/18 Admit, lumbar laminectomy, PEA cardiac arrest after surgery  Summary: 60 yo female with low back pain from L2-3 disk herniation and stenosis causing  neurogenic claudication.  She had b/l L2-3 lumbar laminectomy 10/18 with EBL of 200 ml.  Post-op she developed hypotension, facial itching, pallor and then PEA cardiac arrest with 5 minutes of CPR before ROSC.  She had received several medications with sedating properties in the post-operative period.  Assessment/plan:  PEA cardiac arrest most likely from hypovolemia, residual effects of anesthesia, and sedating medications. - wean pressors to keep SBP > 90, MAP > 65 - continue IV fluids - f/u Echo - ?allergic reaction >> no evidence for rash/angioedema  Neurogenic claudication s/p lumbar laminectomy. - post op care per neurosurgery - limit sedating medications as able  Hypokalemia, hypomagnesemia. - replace electrolytes as needed  Hx of asthma. - prn albuterol  Constipation. - adjust bowel regimen  Updated pt's husband at bedside.   Coralyn Helling, MD [facility] Pulmonary/Critical Care 04/18/2016, 11:57 AM Pager:  [phone] After 3pm call: [phone]

## 2016-04-19 ENCOUNTER — Inpatient Hospital Stay (HOSPITAL_COMMUNITY): Payer: BLUE CROSS/BLUE SHIELD

## 2016-04-19 ENCOUNTER — Other Ambulatory Visit (HOSPITAL_COMMUNITY): Payer: BLUE CROSS/BLUE SHIELD

## 2016-04-19 DIAGNOSIS — I9589 Other hypotension: Secondary | ICD-10-CM

## 2016-04-19 DIAGNOSIS — R079 Chest pain, unspecified: Secondary | ICD-10-CM

## 2016-04-19 NOTE — Progress Notes (Signed)
PCCM PROGRESS NOTE  Admission date: 04/16/2016 Consult date: 04/16/2016 Referring provider: Dr. Jule SerNudleman  CC: Cardiac arrest  Subjective: Mild rib discomfort.  Has burning sensation in hips.  Vital signs: BP 104/67   Pulse 68   Temp 98.7 F (37.1 C) (Oral)   Resp 15   Ht 5\' 6"  (1.676 m)   Wt 159 lb (72.1 kg)   SpO2 97%   BMI 25.66 kg/m   Intake/output: I/O last 3 completed shifts: In: 2577.7 [P.O.:820; I.V.:1757.7] Out: -   General: alert Neuro: normal strength HEENT: pupils reactive, no stridor Cardiac: regular, no murmur Chest: no wheeze/rales Abd: soft, non tender Ext: no edema Skin: no rashes   CMP Latest Ref Rng & Units 04/18/2016 04/17/2016 04/16/2016  Glucose 65 - 99 mg/dL 161(W108(H) - 960(A275(H)  BUN 6 - 20 mg/dL 5(L) - 8  Creatinine 5.400.44 - 1.00 mg/dL 9.810.67 - 1.910.86  Sodium 478135 - 145 mmol/L 139 - 136  Potassium 3.5 - 5.1 mmol/L 3.5 - 2.9(L)  Chloride 101 - 111 mmol/L 111 - 104  CO2 22 - 32 mmol/L 25 - 21(L)  Calcium 8.9 - 10.3 mg/dL 7.8(L) - 8.2(L)  Total Protein 6.5 - 8.1 g/dL - 5.6(L) 5.4(L)  Total Bilirubin 0.3 - 1.2 mg/dL - 0.5 0.4  Alkaline Phos 38 - 126 U/L - 39 40  AST 15 - 41 U/L - 87(H) 62(H)  ALT 14 - 54 U/L - 71(H) 42    CBC Latest Ref Rng & Units 04/18/2016 04/17/2016 04/16/2016  WBC 4.0 - 10.5 K/uL 7.7 8.6 4.1  Hemoglobin 12.0 - 15.0 g/dL 10.0(L) 12.0 14.8  Hematocrit 36.0 - 46.0 % 30.2(L) 35.1(L) 43.0  Platelets 150 - 400 K/uL 164 197 210    Cardiac Panel (last 3 results)  Recent Labs  04/16/16 2204 04/17/16 0551 04/17/16 0939  TROPONINI <0.03 0.24* 0.18*    Imaging No results found. Events: 10/18 Admit, lumbar laminectomy, PEA cardiac arrest after surgery  Summary: 60 yo female with low back pain from L2-3 disk herniation and stenosis causing neurogenic claudication.  She had b/l L2-3 lumbar laminectomy 10/18 with EBL of 200 ml.  Post-op she developed hypotension, facial itching, pallor and then PEA cardiac arrest with 5 minutes  of CPR before ROSC.  She had received several medications with sedating properties in the post-operative period.  Assessment/plan:  PEA cardiac arrest most likely from hypovolemia, residual effects of anesthesia, and sedating medications. - off pressors 10/20 - f/u Echo  Neurogenic claudication s/p lumbar laminectomy. - post op care per neurosurgery - limit sedating medications as able  Hypokalemia, hypomagnesemia. - replace electrolytes as needed  Hx of asthma. - prn albuterol  Constipation. - bowel regimen  If Echo is unremarkable, then she would be stable [facility].  Updated pt's husband at bedside.   Coralyn HellingVineet Patton Rabinovich, MD [facility] Pulmonary/Critical Care 04/19/2016, 8:47 AM Pager:  [phone] After 3pm call: [phone]

## 2016-04-19 NOTE — Progress Notes (Signed)
Patient ID: Stephanie CostainJennifer L Ethington, female   DOB: 02/12/1956, 60 y.o.   MRN: [id number]  BP 112/65   Pulse 87   Temp 98.9 F (37.2 C) (Oral)   Resp 12   Ht 5\' 6"  (1.676 m)   Wt 72.1 kg (159 lb)   SpO2 100%   BMI 25.66 kg/m  Alert and oriented x 4, speech is clear and fluent. Oxygenating well. No rib fractures identified Echo done, no interpretation available yet.  Feels she is a bit more painful today, but has been more active.  Overall doing well.

## 2016-04-19 NOTE — Progress Notes (Signed)
  Echocardiogram 2D Echocardiogram has been performed.  Stephanie Noble, Stephanie Noble 04/19/2016, 5:39 PM

## 2016-04-20 DIAGNOSIS — I959 Hypotension, unspecified: Secondary | ICD-10-CM

## 2016-04-20 DIAGNOSIS — G4734 Idiopathic sleep related nonobstructive alveolar hypoventilation: Secondary | ICD-10-CM

## 2016-04-20 LAB — ECHOCARDIOGRAM COMPLETE
CHL CUP MV DEC (S): 250
CHL CUP TV REG PEAK VELOCITY: 286 cm/s
E/e' ratio: 9.47
EWDT: 250 ms
FS: 35 % (ref 28–44)
HEIGHTINCHES: 66 in
IV/PV OW: 0.88
LA ID, A-P, ES: 33 mm
LA diam index: 1.82 cm/m2
LA vol index: 17 mL/m2
LAVOL: 30.8 mL
LAVOLA4C: 27.4 mL
LEFT ATRIUM END SYS DIAM: 33 mm
LV E/e'average: 9.47
LV PW d: 9.86 mm — AB (ref 0.6–1.1)
LV TDI E'MEDIAL: 8.27
LV e' LATERAL: 10.4 cm/s
LVEEMED: 9.47
LVOT area: 4.15 cm2
LVOTD: 23 mm
MV pk E vel: 98.5 m/s
MVPG: 4 mmHg
MVPKAVEL: 94.6 m/s
RV LATERAL S' VELOCITY: 19 cm/s
RV sys press: 36 mmHg
TAPSE: 23 mm
TDI e' lateral: 10.4
TRMAXVEL: 286 cm/s
WEIGHTICAEL: 2544 [oz_av]

## 2016-04-20 MED ORDER — OXYCODONE-ACETAMINOPHEN 5-325 MG PO TABS: 1.0000 | ORAL_TABLET | Freq: Four times a day (QID) | ORAL | 0 refills | Status: DC | PRN

## 2016-04-20 NOTE — Progress Notes (Addendum)
PCCM PROGRESS NOTE  Admission date: 04/16/2016 Consult date: 04/16/2016 Referring provider: Dr. Jule Ser  CC: Cardiac arrest  Subjective: Intermittent oxygen desaturation while asleep >> no issues while awake.  Vital signs: BP 114/71   Pulse 81   Temp 98.4 F (36.9 C) (Oral)   Resp 18   Ht 5\' 6"  (1.676 m)   Wt 159 lb (72.1 kg)   SpO2 97%   BMI 25.66 kg/m   Intake/output: I/O last 3 completed shifts: In: 340 [P.O.:340] Out: -   General: alert Neuro: normal strength HEENT: pupils reactive, no stridor Cardiac: regular, no murmur Chest: no wheeze/rales Abd: soft, non tender Ext: no edema Skin: no rashes   CMP Latest Ref Rng & Units 04/18/2016 04/17/2016 04/16/2016  Glucose 65 - 99 mg/dL 161(W) - 960(A)  BUN 6 - 20 mg/dL 5(L) - 8  Creatinine 5.40 - 1.00 mg/dL 9.81 - 1.91  Sodium 478 - 145 mmol/L 139 - 136  Potassium 3.5 - 5.1 mmol/L 3.5 - 2.9(L)  Chloride 101 - 111 mmol/L 111 - 104  CO2 22 - 32 mmol/L 25 - 21(L)  Calcium 8.9 - 10.3 mg/dL 7.8(L) - 8.2(L)  Total Protein 6.5 - 8.1 g/dL - 5.6(L) 5.4(L)  Total Bilirubin 0.3 - 1.2 mg/dL - 0.5 0.4  Alkaline Phos 38 - 126 U/L - 39 40  AST 15 - 41 U/L - 87(H) 62(H)  ALT 14 - 54 U/L - 71(H) 42    CBC Latest Ref Rng & Units 04/18/2016 04/17/2016 04/16/2016  WBC 4.0 - 10.5 K/uL 7.7 8.6 4.1  Hemoglobin 12.0 - 15.0 g/dL 10.0(L) 12.0 14.8  Hematocrit 36.0 - 46.0 % 30.2(L) 35.1(L) 43.0  Platelets 150 - 400 K/uL 164 197 210    Cardiac Panel (last 3 results) No results for input(s): CKTOTAL, CKMB, TROPONINI, RELINDX in the last 72 hours.  Imaging Dg Ribs Bilateral W/chest  Result Date: 04/19/2016 CLINICAL DATA:  Recent CPR. Mid sternal pain. Chest pain of uncertain etiology. EXAM: BILATERAL RIBS AND CHEST - 4+ VIEW COMPARISON:  04/16/2016 FINDINGS: Again noted is surgical hardware in the lower cervical spine. There are low lung volumes with prominent interstitial lung markings. Heart size is slightly prominent but  probably accentuated by the technique and low lung volumes. Negative for pneumothorax. No focal airspace disease. Pedicle screw and rod fixation in the lumbar spine. Surgical clips in the right upper abdomen. No displaced rib fracture. IMPRESSION: Low lung volumes without focal lung disease. Prominent lung markings related to low lung volumes. No evidence for a displaced rib fracture. Postsurgical changes in the cervical and lumbar spine. Electronically Signed   By: Richarda Overlie M.D.   On: 04/19/2016 14:55   Events: 10/18 Admit, lumbar laminectomy, PEA cardiac arrest after surgery  Summary: 60 yo female with low back pain from L2-3 disk herniation and stenosis causing neurogenic claudication.  She had b/l L2-3 lumbar laminectomy 10/18 with EBL of 200 ml.  Post-op she developed hypotension, facial itching, pallor and then PEA cardiac arrest with 5 minutes of CPR before ROSC.  She had received several medications with sedating properties in the post-operative period.  Assessment/plan:  PEA cardiac arrest most likely from hypovolemia, residual effects of anesthesia, and sedating medications. - off pressors 10/20 - f/u Echo  Mild oxygen desaturation while asleep. - she needs to f/u with PCP as outpt to assess need for sleep study to determine if she has sleep disordered breathing  Neurogenic claudication s/p lumbar laminectomy. - post op care per neurosurgery -  limit sedating medications as able  Hypokalemia, hypomagnesemia. - replace electrolytes as needed  Hx of asthma. - prn albuterol  Constipation. - bowel regimen  If Echo is unremarkable, then she would be stable [facility]  Updated pt's husband at bedside.   Coralyn HellingVineet Ugo Thoma, MD [facility] Pulmonary/Critical [facility] 04/20/2016, 9:55 AM Pager:  [phone] After 3pm call: [phone]   Echo results reviewed.  Normal LV function.    Okay to d/c home from pulmonary standpoint.  Coralyn HellingVineet Fairy Ashlock, MD [facility]  Pulmonary/Critical Care 04/20/2016, 11:30 AM Pager:  [phone] After 3pm call: [phone]

## 2016-04-20 NOTE — Discharge Summary (Signed)
Physician Discharge Summary  Patient ID: Stephanie CostainJennifer L Noble MRN: [id number] DOB/AGE: 60/09/1955 60 y.o.  Admit date: 04/16/2016 Discharge date: 04/20/2016  Admission Diagnoses:Left L2-3 lumbar disc herniation, lumbar stenosis with neurogenic claudication, lumbar scoliosis, umbar spondylosis, lumbar degenerative disc disease     Discharge Diagnoses: Left L2-3 lumbar disc herniation, lumbar stenosis with neurogenic claudication, lumbar scoliosis, umbar spondylosis, lumbar degenerative disc disease    Active Problems:   HNP (herniated nucleus pulposus), lumbar   Discharged Condition: good  Hospital Course: Mrs. Stephanie Noble was taken to the operating room and had an uncomplicated plif at L2/3, with removal of L5 pedicle screws. Post op she had a PEA felt to be due to over sedation, and the general anesthetic. She was coded and resuscitated, regaining her pulse spontaneously In ~4 minutes. Post arrest she was transferred to the ICU. She had an echo which revealed normal Left ventricular function, and overall showed normal heart mechanics. She is walking well. Her wound is clean, and dry showing no signs of infection. Mrs. Stephanie Noble is voiding, and tolerating a regular diet.  Treatments: surgery: Explantation of L5 pedicle screws, rods, and locking caps;  2) bilateral L2-3 lumbar laminectomy, facetectomy, and foraminotomies, for decompression of the stenotic compression of the exiting L2 and L3 nerve roots, with decompression beyond that required for interbody arthrodesis; bilateral L2-3 posterior lumbar interbody arthrodesis with AVS peek interbody implants, V toss BA with bone marrow aspirate, and infuse; bilateral L2-3 posterior lateral arthrodesis with segmental radius posterior instrumentation (new pedicle screws at L2 tied into existing pedicle screws at L3 and L4 bilaterally), locally harvested morselized autograft,V toss BA with bone marrow aspirate,and infuse     Discharge Exam: Blood pressure  116/66, pulse 88, temperature 98.4 F (36.9 C), temperature source Oral, resp. rate 19, height 5\' 6"  (1.676 m), weight 72.1 kg (159 lb), SpO2 98 %. General appearance: alert, cooperative, appears stated age and no distress Neurologic: Alert and oriented X 3, normal strength and tone. Normal symmetric reflexes. Normal coordination and gait  Disposition: 01-Home or Self Care SPINAL STENOSIS, LUMBAR REGION    Medication List    TAKE these medications   albuterol 108 (90 Base) MCG/ACT inhaler Commonly known as:  PROVENTIL HFA;VENTOLIN HFA Inhale 2 puffs into the lungs every 6 (six) hours as needed for wheezing or shortness of breath.   albuterol (2.5 MG/3ML) 0.083% nebulizer solution Commonly known as:  PROVENTIL Take 3 mLs (2.5 mg total) by nebulization every 4 (four) hours as needed for wheezing or shortness of breath.   VENTOLIN HFA 108 (90 Base) MCG/ACT inhaler Generic drug:  albuterol USE 2 PUFFS INTO LUNGS EVERY 6 HOURS AS NEEDED FOR SHORTNESS OF BREATH   celecoxib 200 MG capsule Commonly known as:  CELEBREX Take 200 mg by mouth daily.   gabapentin 300 MG capsule Commonly known as:  NEURONTIN Take 900 mg by mouth at bedtime.   methocarbamol 500 MG tablet Commonly known as:  ROBAXIN Take 500 mg by mouth every 6 (six) hours as needed for muscle spasms.   oxyCODONE-acetaminophen 5-325 MG tablet Commonly known as:  PERCOCET/ROXICET Take 1 tablet by mouth every 6 (six) hours as needed for moderate pain.   zolpidem 10 MG tablet Commonly known as:  AMBIEN TAKE 1/2 TO 1 TABLET BY MOUTH AT BEDTIME AS NEEDED FOR SLEEP        Signed: Latanya Hemmer L 04/20/2016, 11:22 AM

## 2016-04-20 NOTE — Care Management Note (Signed)
Case Management Note  Patient Details  Name: Stephanie Noble MRN: [id number] Date of Birth: 07/06/1955  Subjective/Objective:                  Left L2-3 lumbar disc herniation, lumbar stenosis with neurogenic claudication, lumbar scoliosis, umbar spondylosis, lumbar degenerative disc disease  Action/Plan: Discharge planning Expected Discharge Date:  04/22/16               Expected Discharge Plan:  Home/Self Care  In-House Referral:     Discharge planning Services  CM Consult  Post Acute Care Choice:    Choice offered to:  Patient  DME Arranged:  3-N-1 DME Agency:  [facility]  HH Arranged:  NA HH Agency:  NA  Status of Service:  Completed, signed off  If discussed at Long Length of Stay Meetings, dates discussed:    Additional Comments: CM received call from RN requesting 3n1.  CM notified [facility] DME rep, Reggie to please deliver the 3n1 to room prior to discharge.  No other CM needs were communicated. Stephanie Noble, Stephanie Boylen Christine, RN 04/20/2016, 11:47 AM

## 2016-04-20 NOTE — Progress Notes (Signed)
Pt discharged to home with husband. Discharge informations discussed. Percocet script given to pt. Bedside commode supplied before pt left. Pt and family instructed on f/u with Dr Newell CoralNudelman and following up with PCP for possible sleep apnea workup per Dr. Craige CottaSood instructions.

## 2016-06-16 ENCOUNTER — Other Ambulatory Visit: Payer: Self-pay | Admitting: Family Medicine

## 2016-06-16 DIAGNOSIS — Z1231 Encounter for screening mammogram for malignant neoplasm of breast: Secondary | ICD-10-CM

## 2016-06-18 ENCOUNTER — Other Ambulatory Visit: Payer: Self-pay | Admitting: Family Medicine

## 2016-06-18 ENCOUNTER — Ambulatory Visit (HOSPITAL_COMMUNITY): Payer: BLUE CROSS/BLUE SHIELD

## 2016-06-20 ENCOUNTER — Ambulatory Visit (HOSPITAL_COMMUNITY)
Admission: RE | Admit: 2016-06-20 | Discharge: 2016-06-20 | Disposition: A | Discharge status: Home / Self Care | Payer: BLUE CROSS/BLUE SHIELD | Source: Ambulatory Visit | Attending: Family Medicine | Admitting: Family Medicine

## 2016-06-20 DIAGNOSIS — Z1231 Encounter for screening mammogram for malignant neoplasm of breast: Secondary | ICD-10-CM | POA: Diagnosis not present

## 2016-07-24 ENCOUNTER — Ambulatory Visit (INDEPENDENT_AMBULATORY_CARE_PROVIDER_SITE_OTHER): Payer: Self-pay

## 2016-07-24 ENCOUNTER — Encounter (INDEPENDENT_AMBULATORY_CARE_PROVIDER_SITE_OTHER): Payer: Self-pay | Admitting: Orthopedic Surgery

## 2016-07-24 ENCOUNTER — Ambulatory Visit (INDEPENDENT_AMBULATORY_CARE_PROVIDER_SITE_OTHER): Payer: BLUE CROSS/BLUE SHIELD | Admitting: Orthopedic Surgery

## 2016-07-24 DIAGNOSIS — M79671 Pain in right foot: Secondary | ICD-10-CM

## 2016-07-24 DIAGNOSIS — M79672 Pain in left foot: Secondary | ICD-10-CM

## 2016-07-24 MED ORDER — DICLOFENAC SODIUM 2 % TD SOLN: 1.0000 | Freq: Two times a day (BID) | TRANSDERMAL | 1 refills | Status: DC

## 2016-07-25 NOTE — Progress Notes (Signed)
Office Visit Note   Patient: Davene CostainJennifer L Cruey           Date of Birth: 09/05/1955           MRN: [id number] Visit Date: 07/24/2016 Requested by: Merlyn AlbertWilliam S Luking, MD [address] [place], [place] [id number] PCP: Lubertha SouthSteve Luking, MD  Subjective: Chief Complaint  Patient presents with  . Right Foot - Pain  . Left Foot - Pain    HPI Victorino DikeJennifer is a 61 year old patient with bilateral foot pain.  She's had bones at the base of her toes for several years.  She has spinal surgery 3 months ago.  Neurosurgeon thinks the foot pain is not related.  She states she has a leg length discrepancy.  She wonders if this may be contributing to the problem.  The patient was told that she does not have bunions.  She describes some redness at the MTP joints dorsally.  It is not a smoker no recent travel she takes gabapentin and Celebrex for the problem she been walking about 1 mile a day.  She does have shoes which are long wide and deep.  She has made that adjustment because of these toe situations              Review of Systems All systems reviewed are negative as they relate to the chief complaint within the history of present illness.  Patient denies  fevers or chills.    Assessment & Plan: Visit Diagnoses:  1. 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  Her arthritis is severe.  She is not a candidate for cheilectomy.  I think she would either need joint replacement or fusion.  This is discussed at length with her.  I would refer to Dr. Lajoyce Cornersuda for that intervention.  I think we could try some Pennsaid cream which is prescribed.  I'll see her back as needed  Follow-Up Instructions: No Follow-up on file.   Orders:  Orders Placed This Encounter  Procedures  . XR Foot Complete Left  . XR Foot Complete Right   Meds ordered this encounter  Medications  . Diclofenac Sodium (PENNSAID) 2 % SOLN    Sig: Place 1 Squirt onto the skin 2 (two) times  daily.    Dispense:  1 Bottle    Refill:  1      Procedures: No procedures performed   Clinical Data: No additional findings.  Objective: Vital Signs: There were no vitals taken for this visit.  Physical Exam   Constitutional: Patient appears well-developed HEENT:  Head: Normocephalic Eyes:EOM are normal Neck: Normal range of motion Cardiovascular: Normal rate Pulmonary/chest: Effort normal Neurologic: Patient is alert Skin: Skin is warm Psychiatric: Patient has normal mood and affect    Ortho Exam examination of both feet demonstrate normal arches good ankle dorsi flexion plantar flexion inversion E versus strength.  She does have some redness and dorsal osteophyte at the first MTP joint.  There is no real surrounding erythema or fluctuance but just.  Of redness.  Her MTP range of motion is diminished more on the right than the left.  To about less than half of the normal arc of motion.  There is no lesser toe deformity.  Sensation is intact on the dorsal and plantar aspect of the foot.  Specialty Comments:  No specialty comments available.  Imaging: Xr Foot Complete Left  Result Date: 07/25/2016 3 views left  foot AP lateral oblique reviewed.  Significant end-stage osteoarthritis of the MTP joint is present with dorsal osteophytes and spurring.  Remainder of the forefoot and tarsometatarsal joints are normal  Xr Foot Complete Right  Result Date: 07/25/2016 3 views right foot reviewed.  Significant MTP joint space narrowing and arthritis is present.  The remaining MTP joints are normal.  Dorsal osteophytes are present.    PMFS History: Patient Active Problem List   Diagnosis Date Noted  . Pain in both feet 07/24/2016  . HNP (herniated nucleus pulposus), lumbar 04/16/2016  . Chronic neck pain 06/22/2015  . Arthritis 01/04/2013  . Insomnia 01/04/2013   Past Medical History:  Diagnosis Date  . Asthma    uses albuterol as needed "mostly weather  induced".ALbuterol and Ventolin inhaler as needed  . Chronic back pain   . Chronic neck pain   . Complication of anesthesia   . Degenerative disc disease    cervical and lumbar area  . History of bronchitis 2016  . Insomnia    takes Ambien nightly  . Muscle spasm    takes Robaxin daily   . Pneumonia    hx of-2016  . PONV (postoperative nausea and vomiting)   . Weakness    numbness and tingling in legs    No family history on file.  Past Surgical History:  Procedure Laterality Date  . BACK SURGERY     x 3 lumbar  . CHOLECYSTECTOMY    . DILATION AND CURETTAGE OF UTERUS     x 2  . KNEE SURGERY Left    x 2  . POSTERIOR CERVICAL FUSION/FORAMINOTOMY  05/17/2012   Procedure: POSTERIOR CERVICAL FUSION/FORAMINOTOMY LEVEL 2;  Surgeon: Hewitt Shorts, MD;  Location: [facility];  Service: Neurosurgery;  Laterality: N/A;  cervical six to Thoracic One posterior cervical arthrodesis with instrumentation  . SHOULDER SURGERY     right  . SPINE SURGERY     x 5   Social History   Occupational History  . Not on file.   Social History Main Topics  . Smoking status: Never Smoker  . Smokeless tobacco: Never Used  . Alcohol use No  . Drug use: No  . Sexual activity: Not on file

## 2016-07-31 ENCOUNTER — Telehealth: Payer: Self-pay | Admitting: Family Medicine

## 2016-07-31 NOTE — Telephone Encounter (Signed)
Ok lets do 

## 2016-07-31 NOTE — Telephone Encounter (Signed)
Requesting Rx for tubing for nebulizer and albuterol solution sent to [facility].

## 2016-07-31 NOTE — Telephone Encounter (Signed)
Last seen 05/2015

## 2016-08-01 MED ORDER — ALBUTEROL SULFATE (2.5 MG/3ML) 0.083% IN NEBU: 2.5000 mg | INHALATION_SOLUTION | RESPIRATORY_TRACT | 0 refills | Status: DC | PRN

## 2016-08-01 NOTE — Telephone Encounter (Signed)
Med and script for tubing sent to pharmacy.

## 2016-08-17 ENCOUNTER — Other Ambulatory Visit: Payer: Self-pay | Admitting: Family Medicine

## 2016-08-18 NOTE — Telephone Encounter (Signed)
Ok six mo 

## 2016-08-27 ENCOUNTER — Other Ambulatory Visit: Payer: Self-pay | Admitting: *Deleted

## 2016-08-27 MED ORDER — CELECOXIB 200 MG PO CAPS: 200.0000 mg | ORAL_CAPSULE | Freq: Every day | ORAL | 0 refills | Status: DC

## 2016-09-08 ENCOUNTER — Ambulatory Visit (INDEPENDENT_AMBULATORY_CARE_PROVIDER_SITE_OTHER): Payer: BLUE CROSS/BLUE SHIELD | Admitting: Orthopedic Surgery

## 2016-09-10 ENCOUNTER — Ambulatory Visit (INDEPENDENT_AMBULATORY_CARE_PROVIDER_SITE_OTHER): Payer: BLUE CROSS/BLUE SHIELD | Admitting: Orthopedic Surgery

## 2016-09-15 ENCOUNTER — Encounter (INDEPENDENT_AMBULATORY_CARE_PROVIDER_SITE_OTHER): Payer: Self-pay

## 2016-09-15 ENCOUNTER — Ambulatory Visit (INDEPENDENT_AMBULATORY_CARE_PROVIDER_SITE_OTHER): Payer: BLUE CROSS/BLUE SHIELD | Admitting: Orthopedic Surgery

## 2016-09-15 DIAGNOSIS — M2022 Hallux rigidus, left foot: Secondary | ICD-10-CM

## 2016-09-15 DIAGNOSIS — M2021 Hallux rigidus, right foot: Secondary | ICD-10-CM | POA: Diagnosis not present

## 2016-09-15 DIAGNOSIS — M2020 Hallux rigidus, unspecified foot: Secondary | ICD-10-CM | POA: Insufficient documentation

## 2016-09-15 NOTE — Progress Notes (Signed)
Office Visit Note   Patient: Stephanie Noble           Date of Birth: May 17, 1956           MRN: [id number] Visit Date: 09/15/2016              Requested by: Merlyn Albert, MD [address] [place], [place] [address] PCP: Lubertha South, MD  No chief complaint on file.   HPI: The pt is here for bilateral foot pain. Sje was seen by Dr. August Saucer the end of January and he referred for consult of MTP joint fusion. The pt has had bilateral foot pain left greater than right for " a while" and would like to discuss surgical treatment options. Autumn L Forrest, RMA  Patient states that postoperatively from one of her recent spinal surgeries she was a CODE BLUE secondary to oversedation from narcotics.  Assessment & Plan: Visit Diagnoses:  1. Hallux rigidus, left foot   2. Hallux rigidus, right foot     Plan: Patient would like to consider fusion of the great toe MTP joint on the left first. Risk and benefits were discussed including infection neurovascular injury persistent pain and need for additional surgery risk of DVT. Patient states she understands wish to proceed at this time patient and her husband will discuss their options and call us when they're ready scheduled. Would plan on using Synthes fusion plate with cup and cone reamers outpatient at the surgical center with a regional block not general anesthesia. Patient states she will call when she is ready to schedule surgery.  Follow-Up Instructions: Return in about 2 weeks (around 09/29/2016).   Ortho Exam On examination patient is alert oriented no adenopathy well-dressed normal affect normal respiratory effort patient has difficulty getting from a lying to a sitting position. She is status post 6 spinal surgeries. Examination she has a good dorsalis pedis and posterior tibial pulse bilaterally. She has significant hallux ridges bilaterally with essentially no range of motion the MTP joint bilaterally. Radiographs show complete  bone-on-bone contact of the right great toe MTP joint with a slight amount of joint space of the left. There is no periarticular cystic changes no clinical or radiographic signs of gout. ROS: Complete review of systems negative except as mentioned in history of present illness. Imaging: No results found.  Labs: Lab Results  Component Value Date   REPTSTATUS [id number] FINAL 05/23/2008   CULT NO GROWTH 5 DAYS 05/23/2008    Orders:  No orders of the defined types were placed in this encounter.  No orders of the defined types were placed in this encounter.    Procedures: No procedures performed  Clinical Data: No additional findings.  Subjective: Review of Systems  Objective: Vital Signs: There were no vitals taken for this visit.  Specialty Comments:  No specialty comments available.  PMFS History: Patient Active Problem List   Diagnosis Date Noted  . Hallux rigidus, left foot 09/15/2016  . Hallux rigidus, right foot 09/15/2016  . Pain in both feet 07/24/2016  . HNP (herniated nucleus pulposus), lumbar 04/16/2016  . Chronic neck pain 06/22/2015  . Arthritis 01/04/2013  . Insomnia 01/04/2013   Past Medical History:  Diagnosis Date  . Asthma    uses albuterol as needed "mostly weather induced".ALbuterol and Ventolin inhaler as needed  . Chronic back pain   . Chronic neck pain   . Complication of anesthesia   . Degenerative disc disease    cervical and  lumbar area  . History of bronchitis 2016  . Insomnia    takes Ambien nightly  . Muscle spasm    takes Robaxin daily   . Pneumonia    hx of-2016  . PONV (postoperative nausea and vomiting)   . Weakness    numbness and tingling in legs    No family history on file.  Past Surgical History:  Procedure Laterality Date  . BACK SURGERY     x 3 lumbar  . CHOLECYSTECTOMY    . DILATION AND CURETTAGE OF UTERUS     x 2  . KNEE SURGERY Left    x 2  . POSTERIOR CERVICAL FUSION/FORAMINOTOMY  05/17/2012    Procedure: POSTERIOR CERVICAL FUSION/FORAMINOTOMY LEVEL 2;  Surgeon: Hewitt Shortsobert W Nudelman, MD;  Location: [facility];  Service: Neurosurgery;  Laterality: N/A;  cervical six to Thoracic One posterior cervical arthrodesis with instrumentation  . SHOULDER SURGERY     right  . SPINE SURGERY     x 5   Social History   Occupational History  . Not on file.   Social History Main Topics  . Smoking status: Never Smoker  . Smokeless tobacco: Never Used  . Alcohol use No  . Drug use: No  . Sexual activity: Not on file

## 2016-09-19 ENCOUNTER — Telehealth (INDEPENDENT_AMBULATORY_CARE_PROVIDER_SITE_OTHER): Payer: Self-pay | Admitting: Orthopedic Surgery

## 2016-09-19 NOTE — Telephone Encounter (Signed)
Ms. Stephanie MustacheFisher discussed surgery and decided to proceed with scheduling as soon as possible.  Can you please fill out a surgery sheet for her?  She would like to do the left foot first.

## 2016-09-22 NOTE — Telephone Encounter (Signed)
Blue sheet completed.

## 2016-09-24 NOTE — Telephone Encounter (Signed)
Left message on Stephanie Noble's voicemail to have her return my call to discuss scheduling surgery.

## 2016-09-28 HISTORY — PX: TOE FUSION: SHX1070

## 2016-10-03 ENCOUNTER — Telehealth (INDEPENDENT_AMBULATORY_CARE_PROVIDER_SITE_OTHER): Payer: Self-pay | Admitting: *Deleted

## 2016-10-03 NOTE — Telephone Encounter (Signed)
I called and patient and spoke with her advised her she may continue celebrex. She has questions about how her foot maybe bandaged post op for fusion. Advised that they do include ankle so dressing does not fall off, advised typically adaptic is applied over surgical incision, covering with 4x4 gauze, kerlix rolled gauze, ABD pads and finished with coban layer. She states she has history of prior coding post op due to anxiety, and just wanted to know what would be done before hand. She expressed understanding.

## 2016-10-03 NOTE — Telephone Encounter (Signed)
Okay for her to continue with her Celebrex

## 2016-10-03 NOTE — Telephone Encounter (Signed)
Pt stated surgical center called and asked if pt needs to stop Celebrex before surgery 4/10.

## 2016-10-07 DIAGNOSIS — M2022 Hallux rigidus, left foot: Secondary | ICD-10-CM | POA: Diagnosis not present

## 2016-10-08 ENCOUNTER — Ambulatory Visit (INDEPENDENT_AMBULATORY_CARE_PROVIDER_SITE_OTHER): Payer: BLUE CROSS/BLUE SHIELD | Admitting: Family

## 2016-10-08 ENCOUNTER — Encounter (INDEPENDENT_AMBULATORY_CARE_PROVIDER_SITE_OTHER): Payer: Self-pay | Admitting: Family

## 2016-10-08 DIAGNOSIS — M2022 Hallux rigidus, left foot: Secondary | ICD-10-CM

## 2016-10-08 NOTE — Progress Notes (Signed)
Office Visit Note   Patient: Stephanie Noble           Date of Birth: 04/27/1956           MRN: [id number] Visit Date: 10/08/2016              Requested by: Merlyn Albert, MD [address] [place], [place] [address] PCP: Lubertha South, MD  Chief Complaint  Patient presents with  . Left Foot - Follow-up    GT MTP fusion 1 day post op      HPI: The patient is a 61 year old woman who presents today status post left great toe MTP fusion. Patient states that yesterday she lost her balance and had to put weight down on her left foot him to avoid falling. Called in for concern of increased bleeding. There is some swelling. She is very anxious.  Assessment & Plan: Visit Diagnoses: No diagnosis found.  Plan: the patient will begin daily dressing changes. This was discussed at length. She will continue nonweightbearing. The importance of elevation compression and may use ice as well.  Follow-Up Instructions: No Follow-up on file.   Ortho Exam  Patient is alert, oriented, no adenopathy, well-dressed, normal affect, normal respiratory effort.  left foot incision is intact. There is moderate swelling there is a little bit of bleeding. There is no surrounding erythema no gaping noted drainage no sign of infection Imaging: No results found.  Labs: Lab Results  Component Value Date   REPTSTATUS [id number] FINAL 05/23/2008   CULT NO GROWTH 5 DAYS 05/23/2008    Orders:  No orders of the defined types were placed in this encounter.  No orders of the defined types were placed in this encounter.    Procedures: No procedures performed  Clinical Data: No additional findings.  ROS:  All other systems negative, except as noted in the HPI. Review of Systems  Constitutional: Negative for chills and fever.    Objective: Vital Signs: There were no vitals taken for this visit.  Specialty Comments:  No specialty comments available.  PMFS History: Patient Active Problem  List   Diagnosis Date Noted  . Hallux rigidus, left foot 09/15/2016  . Hallux rigidus, right foot 09/15/2016  . Pain in both feet 07/24/2016  . HNP (herniated nucleus pulposus), lumbar 04/16/2016  . Chronic neck pain 06/22/2015  . Arthritis 01/04/2013  . Insomnia 01/04/2013   Past Medical History:  Diagnosis Date  . Asthma    uses albuterol as needed "mostly weather induced".ALbuterol and Ventolin inhaler as needed  . Chronic back pain   . Chronic neck pain   . Complication of anesthesia   . Degenerative disc disease    cervical and lumbar area  . History of bronchitis 2016  . Insomnia    takes Ambien nightly  . Muscle spasm    takes Robaxin daily   . Pneumonia    hx of-2016  . PONV (postoperative nausea and vomiting)   . Weakness    numbness and tingling in legs    History reviewed. No pertinent family history.  Past Surgical History:  Procedure Laterality Date  . BACK SURGERY     x 3 lumbar  . CHOLECYSTECTOMY    . DILATION AND CURETTAGE OF UTERUS     x 2  . KNEE SURGERY Left    x 2  . POSTERIOR CERVICAL FUSION/FORAMINOTOMY  05/17/2012   Procedure: POSTERIOR CERVICAL FUSION/FORAMINOTOMY LEVEL 2;  Surgeon: Hewitt Shorts, MD;  Location:  [facility];  Service: Neurosurgery;  Laterality: N/A;  cervical six to Thoracic One posterior cervical arthrodesis with instrumentation  . SHOULDER SURGERY     right  . SPINE SURGERY     x 5   Social History   Occupational History  . Not on file.   Social History Main Topics  . Smoking status: Never Smoker  . Smokeless tobacco: Never Used  . Alcohol use No  . Drug use: No  . Sexual activity: Not on file

## 2016-10-14 ENCOUNTER — Encounter (INDEPENDENT_AMBULATORY_CARE_PROVIDER_SITE_OTHER): Payer: Self-pay | Admitting: Orthopedic Surgery

## 2016-10-14 ENCOUNTER — Ambulatory Visit (INDEPENDENT_AMBULATORY_CARE_PROVIDER_SITE_OTHER): Payer: BLUE CROSS/BLUE SHIELD | Admitting: Orthopedic Surgery

## 2016-10-14 VITALS — Ht 66.0 in | Wt 159.0 lb

## 2016-10-14 DIAGNOSIS — M2022 Hallux rigidus, left foot: Secondary | ICD-10-CM

## 2016-10-14 MED ORDER — HYDROCODONE-ACETAMINOPHEN 5-325 MG PO TABS: 1.0000 | ORAL_TABLET | ORAL | 0 refills | Status: DC | PRN

## 2016-10-14 NOTE — Progress Notes (Signed)
Office Visit Note   Patient: Stephanie Noble           Date of Birth: July 06, 1955           MRN: [id number] Visit Date: 10/14/2016              Requested by: Merlyn Albert, MD [address] [place], [place] [address] PCP: Lubertha South, MD  Chief Complaint  Patient presents with  . Left Foot - Routine Post Op    10/07/16 left foot MTP fusion GT      HPI: Patient is one-week status post fusion for hallux rigidus left great toe MTP joint. Patient states she's been up on her foot a lot her foot is swollen was purple and discolored. Discussed with her the importance of elevation to minimize risk of the wound dehiscence area  Assessment & Plan: Visit Diagnoses:  1. Hallux rigidus, left foot     Plan: Recommended elevation continue nonweightbearing I'll send cleansing daily elevation follow-up in 1 week to remove the sutures. Begin weightbearing with her postoperative shoe at follow-up.  2 view radiographs of the left foot at follow-up.  Follow-Up Instructions: Return in about 1 week (around 10/21/2016).   Ortho Exam  Patient is alert, oriented, no adenopathy, well-dressed, normal affect, normal respiratory effort. Examination there is swelling the wound edges are approximated there is no cellulitis no drainage no odor no signs of infection.  Imaging: No results found.  Labs: Lab Results  Component Value Date   REPTSTATUS [id number] FINAL 05/23/2008   CULT NO GROWTH 5 DAYS 05/23/2008    Orders:  No orders of the defined types were placed in this encounter.  No orders of the defined types were placed in this encounter.    Procedures: No procedures performed  Clinical Data: No additional findings.  ROS:  All other systems negative, except as noted in the HPI. Review of Systems  Objective: Vital Signs: Ht  (1.676 m)   Wt 159 lb (72.1 kg)   BMI 25.66 kg/m   Specialty Comments:  No specialty comments available.  PMFS History: Patient Active  Problem List   Diagnosis Date Noted  . Hallux rigidus, left foot 09/15/2016  . Hallux rigidus, right foot 09/15/2016  . Pain in both feet 07/24/2016  . HNP (herniated nucleus pulposus), lumbar 04/16/2016  . Chronic neck pain 06/22/2015  . Arthritis 01/04/2013  . Insomnia 01/04/2013   Past Medical History:  Diagnosis Date  . Asthma    uses albuterol as needed "mostly weather induced".ALbuterol and Ventolin inhaler as needed  . Chronic back pain   . Chronic neck pain   . Complication of anesthesia   . Degenerative disc disease    cervical and lumbar area  . History of bronchitis 2016  . Insomnia    takes Ambien nightly  . Muscle spasm    takes Robaxin daily   . Pneumonia    hx of-2016  . PONV (postoperative nausea and vomiting)   . Weakness    numbness and tingling in legs    No family history on file.  Past Surgical History:  Procedure Laterality Date  . BACK SURGERY     x 3 lumbar  . CHOLECYSTECTOMY    . DILATION AND CURETTAGE OF UTERUS     x 2  . KNEE SURGERY Left    x 2  . POSTERIOR CERVICAL FUSION/FORAMINOTOMY  05/17/2012   Procedure: POSTERIOR CERVICAL FUSION/FORAMINOTOMY LEVEL 2;  Surgeon: Belia Heman  Newell Coral, MD;  Location: [facility];  Service: Neurosurgery;  Laterality: N/A;  cervical six to Thoracic One posterior cervical arthrodesis with instrumentation  . SHOULDER SURGERY     right  . SPINE SURGERY     x 5   Social History   Occupational History  . Not on file.   Social History Main Topics  . Smoking status: Never Smoker  . Smokeless tobacco: Never Used  . Alcohol use No  . Drug use: No  . Sexual activity: Not on file

## 2016-10-16 ENCOUNTER — Inpatient Hospital Stay (INDEPENDENT_AMBULATORY_CARE_PROVIDER_SITE_OTHER): Payer: BLUE CROSS/BLUE SHIELD | Admitting: Orthopedic Surgery

## 2016-10-20 ENCOUNTER — Ambulatory Visit (INDEPENDENT_AMBULATORY_CARE_PROVIDER_SITE_OTHER): Payer: BLUE CROSS/BLUE SHIELD | Admitting: Orthopedic Surgery

## 2016-10-20 ENCOUNTER — Encounter (INDEPENDENT_AMBULATORY_CARE_PROVIDER_SITE_OTHER): Payer: Self-pay | Admitting: Orthopedic Surgery

## 2016-10-20 ENCOUNTER — Ambulatory Visit (INDEPENDENT_AMBULATORY_CARE_PROVIDER_SITE_OTHER): Payer: Self-pay

## 2016-10-20 VITALS — Ht 66.0 in | Wt 159.0 lb

## 2016-10-20 DIAGNOSIS — M79672 Pain in left foot: Secondary | ICD-10-CM | POA: Diagnosis not present

## 2016-10-20 DIAGNOSIS — M2022 Hallux rigidus, left foot: Secondary | ICD-10-CM

## 2016-10-20 NOTE — Progress Notes (Signed)
Office Visit Note   Patient: Stephanie Noble           Date of Birth: [birth year]           MRN: [id number] Visit Date: 10/20/2016              Requested by: Merlyn Albert, MD [address] [place], [place] [address] PCP: Lubertha South, MD  Chief Complaint  Patient presents with  . Left Foot - Routine Post Op    Left foot fusion left great toe MTP      HPI: Patient is one-week status post left great toe MTP fusion.  Assessment & Plan: Visit Diagnoses:  1. Pain in left foot   2. Hallux rigidus, left foot     Plan: Continue nonweightbearing start Dial soap cleansing continue elevation and ice. Follow up in 1 week to harvest the sutures.  Follow-Up Instructions: Return in about 1 week (around 10/27/2016).   Ortho Exam  Patient is alert, oriented, no adenopathy, well-dressed, normal affect, normal respiratory effort. Patient is a blade on crutches. She has significant swelling around the great toe there is no redness no synovitis no drainage. The wound edges are well approximated.  Imaging: Xr Foot 2 Views Left  Result Date: 10/20/2016 Two-view radiographs of left foot shows stable internal fixation of the fusion left great toe MTP joint. No complicating features. Hardware is intact.   Labs: Lab Results  Component Value Date   REPTSTATUS [id number] FINAL 05/23/2008   CULT NO GROWTH 5 DAYS 05/23/2008    Orders:  Orders Placed This Encounter  Procedures  . XR Foot 2 Views Left   No orders of the defined types were placed in this encounter.    Procedures: No procedures performed  Clinical Data: No additional findings.  ROS:  All other systems negative, except as noted in the HPI. Review of Systems  Objective: Vital Signs: Ht  (1.676 m)   Wt 159 lb (72.1 kg)   BMI 25.66 kg/m   Specialty Comments:  No specialty comments available.  PMFS History: Patient Active Problem List   Diagnosis Date Noted  . Hallux rigidus, left foot  09/15/2016  . Hallux rigidus, right foot 09/15/2016  . Pain in both feet 07/24/2016  . HNP (herniated nucleus pulposus), lumbar 04/16/2016  . Chronic neck pain 06/22/2015  . Arthritis 01/04/2013  . Insomnia 01/04/2013   Past Medical History:  Diagnosis Date  . Asthma    uses albuterol as needed "mostly weather induced".ALbuterol and Ventolin inhaler as needed  . Chronic back pain   . Chronic neck pain   . Complication of anesthesia   . Degenerative disc disease    cervical and lumbar area  . History of bronchitis 2016  . Insomnia    takes Ambien nightly  . Muscle spasm    takes Robaxin daily   . Pneumonia    hx of-2016  . PONV (postoperative nausea and vomiting)   . Weakness    numbness and tingling in legs    No family history on file.  Past Surgical History:  Procedure Laterality Date  . BACK SURGERY     x 3 lumbar  . CHOLECYSTECTOMY    . DILATION AND CURETTAGE OF UTERUS     x 2  . KNEE SURGERY Left    x 2  . POSTERIOR CERVICAL FUSION/FORAMINOTOMY  05/17/2012   Procedure: POSTERIOR CERVICAL FUSION/FORAMINOTOMY LEVEL 2;  Surgeon: Hewitt Shorts, MD;  [facility]: [facility];  Service: Neurosurgery;  Laterality: N/A;  cervical six to Thoracic One posterior cervical arthrodesis with instrumentation  . SHOULDER SURGERY     right  . SPINE SURGERY     x 5   Social History   Occupational History  . Not on file.   Social History Main Topics  . Smoking status: Never Smoker  . Smokeless tobacco: Never Used  . Alcohol use No  . Drug use: No  . Sexual activity: Not on file

## 2016-10-27 ENCOUNTER — Ambulatory Visit (INDEPENDENT_AMBULATORY_CARE_PROVIDER_SITE_OTHER): Payer: BLUE CROSS/BLUE SHIELD | Admitting: Orthopedic Surgery

## 2016-10-27 ENCOUNTER — Encounter (INDEPENDENT_AMBULATORY_CARE_PROVIDER_SITE_OTHER): Payer: Self-pay | Admitting: Orthopedic Surgery

## 2016-10-27 VITALS — Ht 66.0 in | Wt 159.0 lb

## 2016-10-27 DIAGNOSIS — M2022 Hallux rigidus, left foot: Secondary | ICD-10-CM

## 2016-10-27 NOTE — Progress Notes (Signed)
Office Visit Note   Patient: Stephanie Noble           Date of Birth: 1956-04-28           MRN: [id number] Visit Date: 10/27/2016              Requested by: Merlyn Albert, MD [address] [place], [place] [address] PCP: Lubertha South, MD  Chief Complaint  Patient presents with  . Left Foot - Routine Post Op, Follow-up      HPI: Patient presents 3 weeks status post left great toe MTP fusion. She states she still has some swelling.  Assessment & Plan: Visit Diagnoses:  1. Hallux rigidus, left foot     Plan: Patient may begin touchdown weightbearing recommended scar massage with lotion. Follow-up in 2 weeks repeat 2 view radiographs of the left foot at which time anticipate we can begin full weightbearing.  Patient states she would like to have the right foot hallux rigidus fused probably this winter.  Follow-Up Instructions: Return in about 2 weeks (around 11/10/2016).   Ortho Exam  Patient is alert, oriented, no adenopathy, well-dressed, normal affect, normal respiratory effort. Examination incision is well-healed there is some slight swelling there is no cellulitis no drainage no signs of infection  Imaging: No results found.  Labs: Lab Results  Component Value Date   REPTSTATUS [id number] FINAL 05/23/2008   CULT NO GROWTH 5 DAYS 05/23/2008    Orders:  No orders of the defined types were placed in this encounter.  No orders of the defined types were placed in this encounter.    Procedures: No procedures performed  Clinical Data: No additional findings.  ROS:  All other systems negative, except as noted in the HPI. Review of Systems  Objective: Vital Signs: Ht  (1.676 m)   Wt 159 lb (72.1 kg)   BMI 25.66 kg/m   Specialty Comments:  No specialty comments available.  PMFS History: Patient Active Problem List   Diagnosis Date Noted  . Hallux rigidus, left foot 09/15/2016  . Hallux rigidus, right foot 09/15/2016  . Pain in both  feet 07/24/2016  . HNP (herniated nucleus pulposus), lumbar 04/16/2016  . Chronic neck pain 06/22/2015  . Arthritis 01/04/2013  . Insomnia 01/04/2013   Past Medical History:  Diagnosis Date  . Asthma    uses albuterol as needed "mostly weather induced".ALbuterol and Ventolin inhaler as needed  . Chronic back pain   . Chronic neck pain   . Complication of anesthesia   . Degenerative disc disease    cervical and lumbar area  . History of bronchitis 2016  . Insomnia    takes Ambien nightly  . Muscle spasm    takes Robaxin daily   . Pneumonia    hx of-2016  . PONV (postoperative nausea and vomiting)   . Weakness    numbness and tingling in legs    No family history on file.  Past Surgical History:  Procedure Laterality Date  . BACK SURGERY     x 3 lumbar  . CHOLECYSTECTOMY    . DILATION AND CURETTAGE OF UTERUS     x 2  . KNEE SURGERY Left    x 2  . POSTERIOR CERVICAL FUSION/FORAMINOTOMY  05/17/2012   Procedure: POSTERIOR CERVICAL FUSION/FORAMINOTOMY LEVEL 2;  Surgeon: Hewitt Shorts, MD;  Location: [facility];  Service: Neurosurgery;  Laterality: N/A;  cervical six to Thoracic One posterior cervical arthrodesis with instrumentation  .  SHOULDER SURGERY     right  . SPINE SURGERY     x 5   Social History   Occupational History  . Not on file.   Social History Main Topics  . Smoking status: Never Smoker  . Smokeless tobacco: Never Used  . Alcohol use No  . Drug use: No  . Sexual activity: Not on file

## 2016-10-30 ENCOUNTER — Telehealth (INDEPENDENT_AMBULATORY_CARE_PROVIDER_SITE_OTHER): Payer: Self-pay | Admitting: Orthopedic Surgery

## 2016-10-30 NOTE — Telephone Encounter (Signed)
I CALLED PATIENT TO ADVISE COPY OF RECORDS READY TO PICK UP

## 2016-11-10 ENCOUNTER — Ambulatory Visit (INDEPENDENT_AMBULATORY_CARE_PROVIDER_SITE_OTHER): Payer: BLUE CROSS/BLUE SHIELD | Admitting: Family

## 2016-11-10 ENCOUNTER — Ambulatory Visit (INDEPENDENT_AMBULATORY_CARE_PROVIDER_SITE_OTHER): Payer: BLUE CROSS/BLUE SHIELD | Admitting: Orthopedic Surgery

## 2016-11-10 ENCOUNTER — Ambulatory Visit (INDEPENDENT_AMBULATORY_CARE_PROVIDER_SITE_OTHER): Payer: BLUE CROSS/BLUE SHIELD

## 2016-11-10 DIAGNOSIS — M2022 Hallux rigidus, left foot: Secondary | ICD-10-CM

## 2016-11-10 NOTE — Progress Notes (Signed)
Office Visit Note   Patient: Stephanie CostainJennifer L Lingard           Date of Birth: 10/02/1955           MRN: [id number] Visit Date: 11/10/2016              Requested by: Merlyn AlbertLuking, William S, MD [address] [place], [place] [id number] PCP: Merlyn AlbertLuking, William S, MD  No chief complaint on file.     HPI: Patient presents 5 weeks status post left great toe MTP fusion. She states she still has some swelling. Has continued to use crutches and has been touchdown weightbearing.  Assessment & Plan: Visit Diagnoses:  1. Hallux rigidus, left foot     Plan: Him will advance weightbearing as tolerated. She will begin in the postop shoe. Advance to regular shoewear as comfortable.  Patient states she would like to have the right foot hallux rigidus fused probably this winter.  Follow-Up Instructions: Return in about 3 weeks (around 12/01/2016), or if symptoms worsen or fail to improve.   Ortho Exam  Patient is alert, oriented, no adenopathy, well-dressed, normal affect, normal respiratory effort. Examination incision is well-healed there is some slight swelling there is no cellulitis no drainage no signs of infection  Imaging: Xr Foot Complete Left  Result Date: 11/10/2016 Stable alignment left great toe fusion with interval callus formation.   Labs: Lab Results  Component Value Date   REPTSTATUS [id number] FINAL 05/23/2008   CULT NO GROWTH 5 DAYS 05/23/2008    Orders:  Orders Placed This Encounter  Procedures  . XR Foot Complete Left   No orders of the defined types were placed in this encounter.    Procedures: No procedures performed  Clinical Data: No additional findings.  ROS:  All other systems negative, except as noted in the HPI. Review of Systems  Constitutional: Negative for chills and fever.  Skin: Negative for color change and wound.    Objective: Vital Signs: There were no vitals taken for this visit.  Specialty Comments:  No specialty comments  available.  PMFS History: Patient Active Problem List   Diagnosis Date Noted  . Hallux rigidus, left foot 09/15/2016  . Hallux rigidus, right foot 09/15/2016  . Pain in both feet 07/24/2016  . HNP (herniated nucleus pulposus), lumbar 04/16/2016  . Chronic neck pain 06/22/2015  . Arthritis 01/04/2013  . Insomnia 01/04/2013   Past Medical History:  Diagnosis Date  . Asthma    uses albuterol as needed "mostly weather induced".ALbuterol and Ventolin inhaler as needed  . Chronic back pain   . Chronic neck pain   . Complication of anesthesia   . Degenerative disc disease    cervical and lumbar area  . History of bronchitis 2016  . Insomnia    takes Ambien nightly  . Muscle spasm    takes Robaxin daily   . Pneumonia    hx of-2016  . PONV (postoperative nausea and vomiting)   . Weakness    numbness and tingling in legs    No family history on file.  Past Surgical History:  Procedure Laterality Date  . BACK SURGERY     x 3 lumbar  . CHOLECYSTECTOMY    . DILATION AND CURETTAGE OF UTERUS     x 2  . KNEE SURGERY Left    x 2  . POSTERIOR CERVICAL FUSION/FORAMINOTOMY  05/17/2012   Procedure: POSTERIOR CERVICAL FUSION/FORAMINOTOMY LEVEL 2;  Surgeon: Hewitt Shortsobert W Nudelman, MD;  [facility];  Service: Neurosurgery;  Laterality: N/A;  cervical six to Thoracic One posterior cervical arthrodesis with instrumentation  . SHOULDER SURGERY     right  . SPINE SURGERY     x 5   Social History   Occupational History  . Not on file.   Social History Main Topics  . Smoking status: Never Smoker  . Smokeless tobacco: Never Used  . Alcohol use No  . Drug use: No  . Sexual activity: Not on file

## 2016-12-16 ENCOUNTER — Other Ambulatory Visit: Payer: Self-pay | Admitting: Family Medicine

## 2017-01-07 ENCOUNTER — Encounter: Payer: Self-pay | Admitting: Family Medicine

## 2017-01-07 ENCOUNTER — Ambulatory Visit (INDEPENDENT_AMBULATORY_CARE_PROVIDER_SITE_OTHER): Payer: BLUE CROSS/BLUE SHIELD | Admitting: Family Medicine

## 2017-01-07 VITALS — BP 130/90 | Ht 66.0 in | Wt 158.2 lb

## 2017-01-07 DIAGNOSIS — F4310 Post-traumatic stress disorder, unspecified: Secondary | ICD-10-CM

## 2017-01-07 DIAGNOSIS — R413 Other amnesia: Secondary | ICD-10-CM

## 2017-01-07 DIAGNOSIS — F419 Anxiety disorder, unspecified: Secondary | ICD-10-CM

## 2017-01-07 DIAGNOSIS — Z8674 Personal history of sudden cardiac arrest: Secondary | ICD-10-CM | POA: Diagnosis not present

## 2017-01-07 NOTE — Progress Notes (Signed)
   Subjective:    Patient ID: Stephanie Noble, female    DOB: 09/18/1955, 61 y.o.   MRN: [id number]  Anxiety  Presents for initial visit. Symptoms include excessive worry, nervous/anxious behavior and obsessions. Primary symptoms comment: excessive crying, forgetfullness .   Risk factors include prior traumatic experience.   Pt losing things   Diminished energy  Smothering sensation at times  More irritable and anxious   Crying at times sad, emotion more and incresed   Pt notes also anxiety and nervousness at times  Pt feeling possible element of ptsd  Pt staes now feeling worse than ever as far as ongoing stress  Positive insomnia   Pt worried that being without oxygen has cused something  Pt suffered an epsiode of cardia carrest and wa in the icu foru four and a half days after  Does not feel depressed, overwhelmed but not depressed   Mo has dementia, hx of, appeared likely five us two yrs ago  Pt has had no hx of anxiety or depr or otherwid in the past  Pt can perform things still well, but haing trpouble explianing things well and cohesively, with hx of high sharp ness, now not   Neuro cognitive symtoms  Review of Systems  Psychiatric/Behavioral: The patient is nervous/anxious.        Objective:   Physical Exam Alert and oriented, Anxious appearing, vitals reviewed and stable, NAD ENT-TM's and ext canals WNL bilat via otoscopic exam Soft palate, tonsils and post pharynx WNL via oropharyngeal exam Neck-symmetric, no masses; thyroid nonpalpable and nontender Pulmonary-no tachypnea or accessory muscle use; Clear without wheezes via auscultation Card--no abnrml murmurs, rhythm reg and rate WNL Carotid pulses symmetric, without bruits   Full hospital record reviewed at great length in presence of patient regarding event last winter     Assessment & Plan:  Impression status post respiratory arrest and potentially status post cardiac arrest with ongoing  residual symptomatology. Very high frustration on the part of the patient. Concern regarding brain damage. Concern regarding PTSD. Concern regarding memory challenges since this occurred. This will need very in depth assessment both from a neurocognitive standpoint and a psychiatric standpoint discussed at great length. We will work on arranging this  Greater than 50% of this 40 minute face to face visit was spent in counseling and discussion and coordination of care regarding the above diagnosis/diagnosies

## 2017-01-19 ENCOUNTER — Encounter: Payer: Self-pay | Admitting: Family Medicine

## 2017-02-08 IMAGING — RF DG LUMBAR SPINE 2-3V
1 series · 2 of 2 positions shown · non-contrast
Comparison: For lumbar spine fusion

CLINICAL DATA: For lumbar spine fusion

EXAM:
LUMBAR SPINE - 2-3 VIEW

[Series 1: run · 2 of 2 slices shown]
[im 1/2]
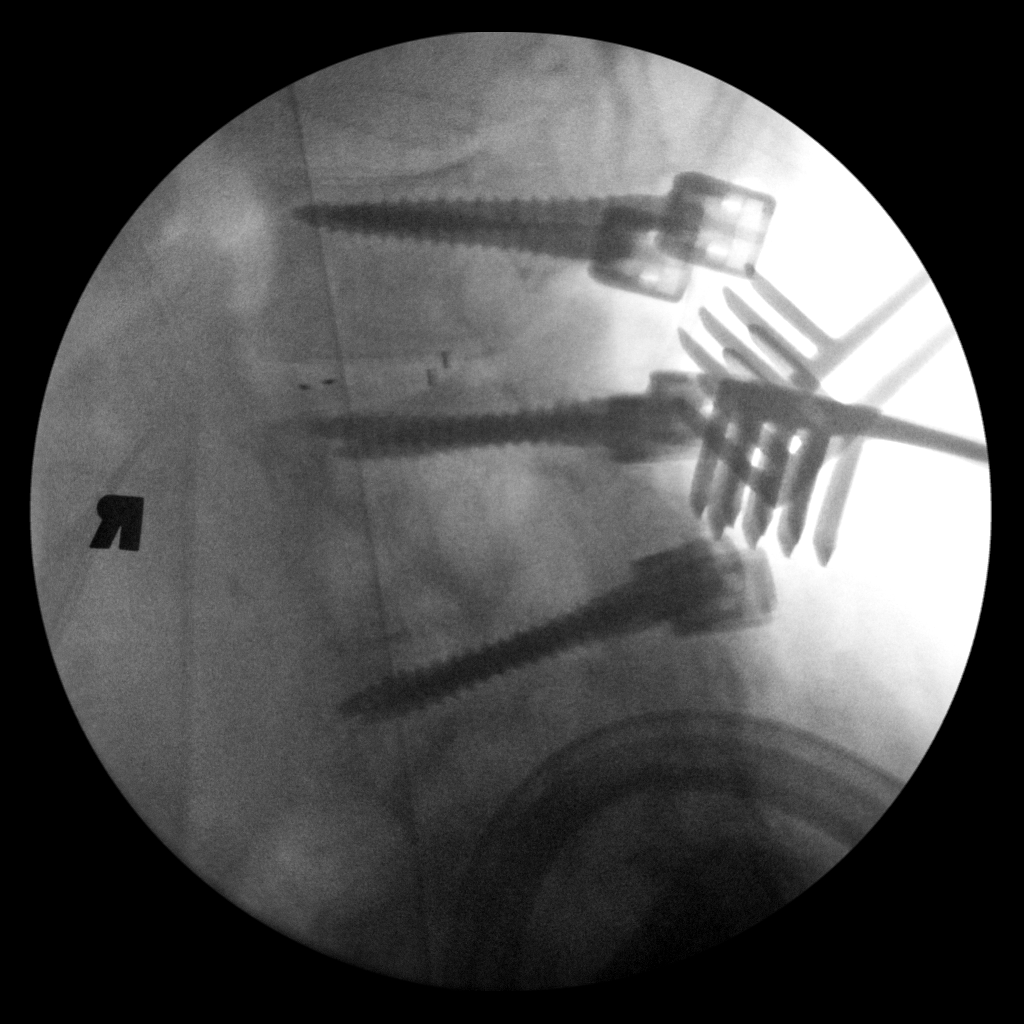
[im 2/2]
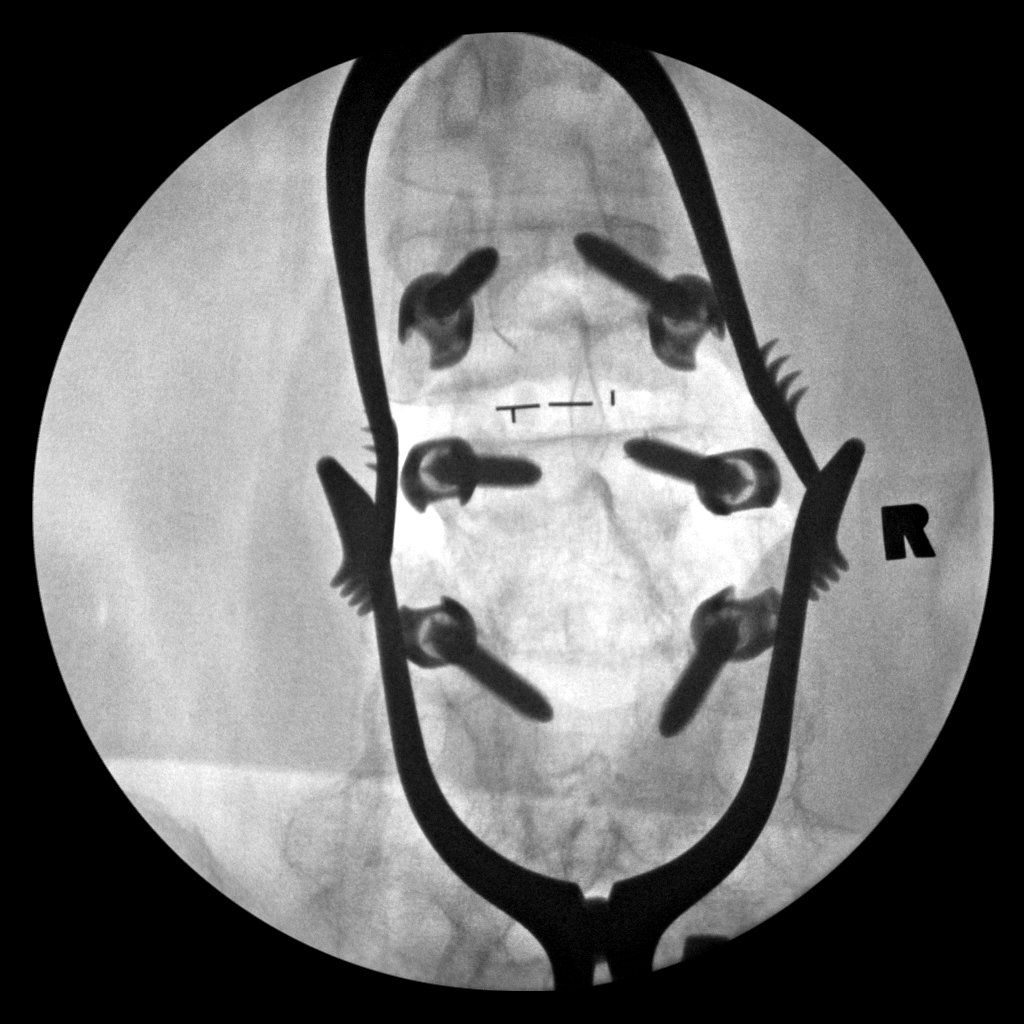

[2 of 2 positions shown; findings below may reference images not displayed]

FINDINGS: Two C-arm spot films were returned. These demonstrate placement of
transpedicular screws at L3, L4, and L5 with interbody fusion plug
at the L3-4 level.
IMPRESSION: C-arm images demonstrate placement of transpedicular screws for
posterior fusion at L3-4 and L4-5.

## 2017-02-08 IMAGING — CR DG LUMBAR SPINE 2-3V
2 series · 2 of 2 positions shown · non-contrast
Comparison: MR lumbar spine of 03/07/2016

CLINICAL DATA: For L2-3 lumbar decompression and interbody fusion
posterior

EXAM:
LUMBAR SPINE - 2-3 VIEW

[lateral (1 of 2)]
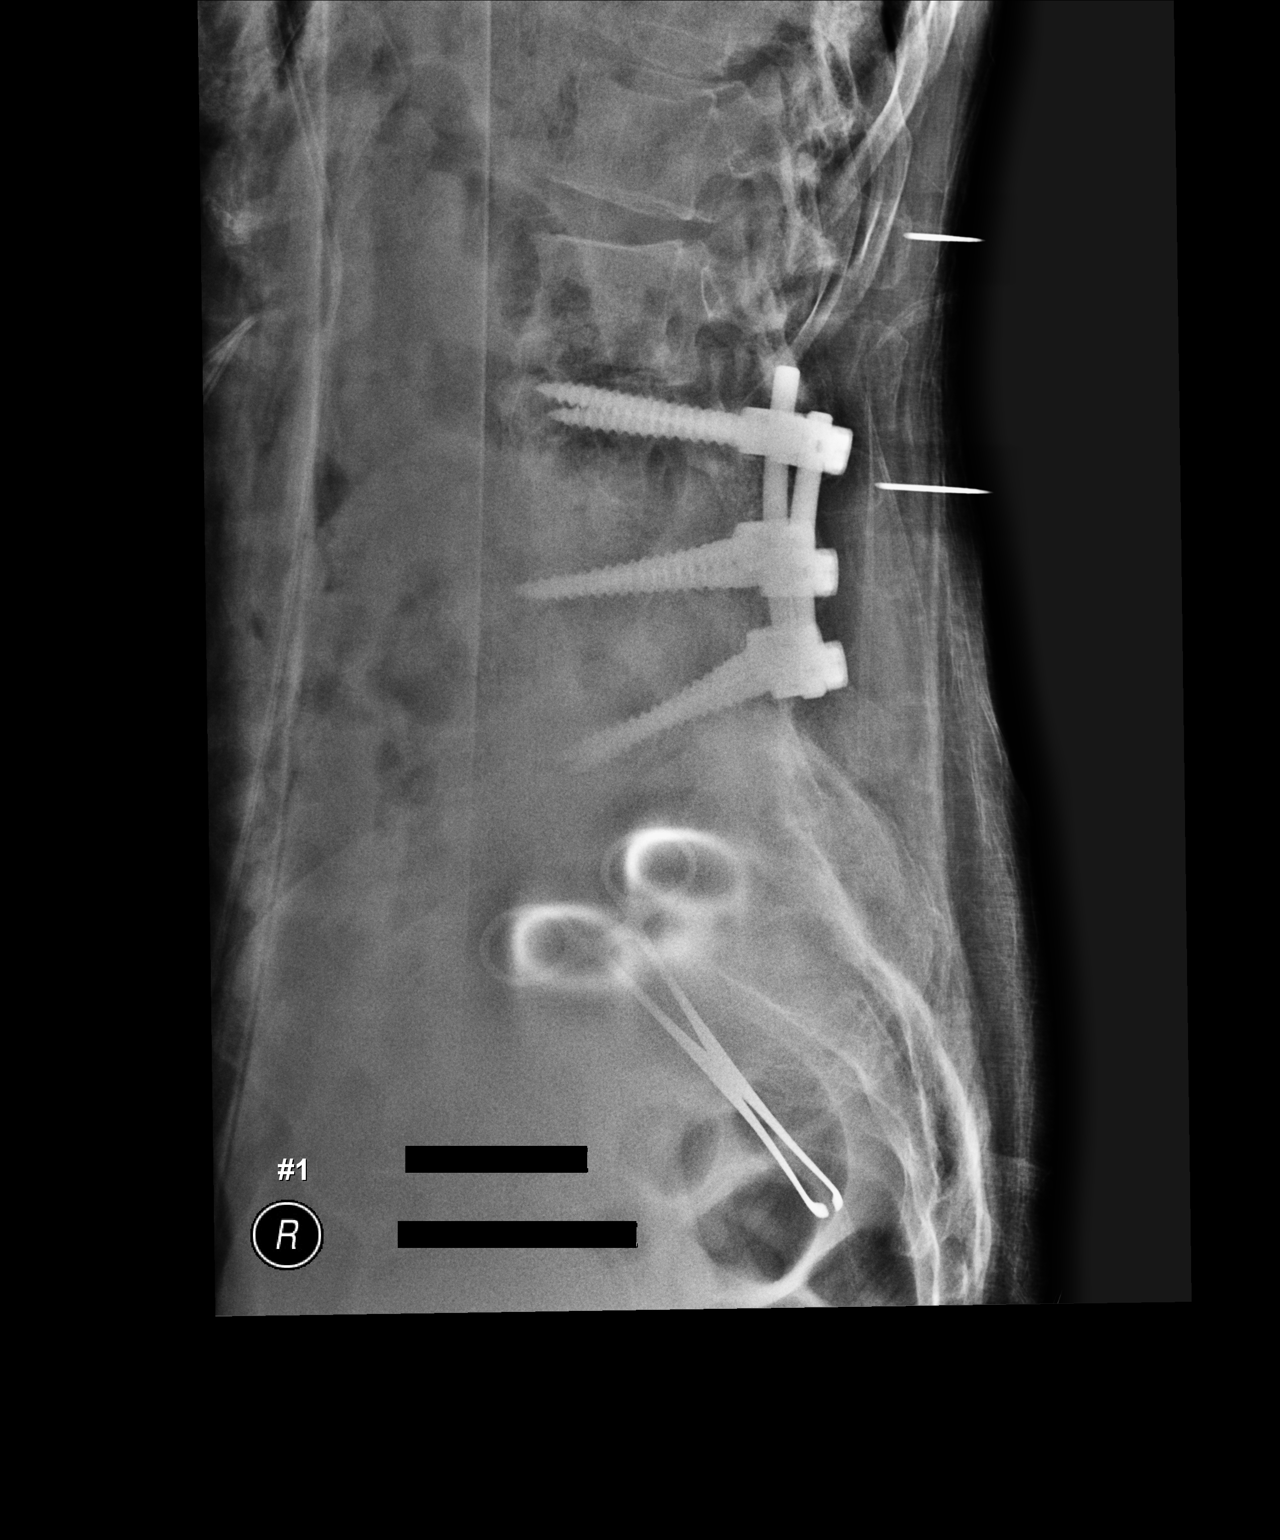

[lateral (2 of 2)]
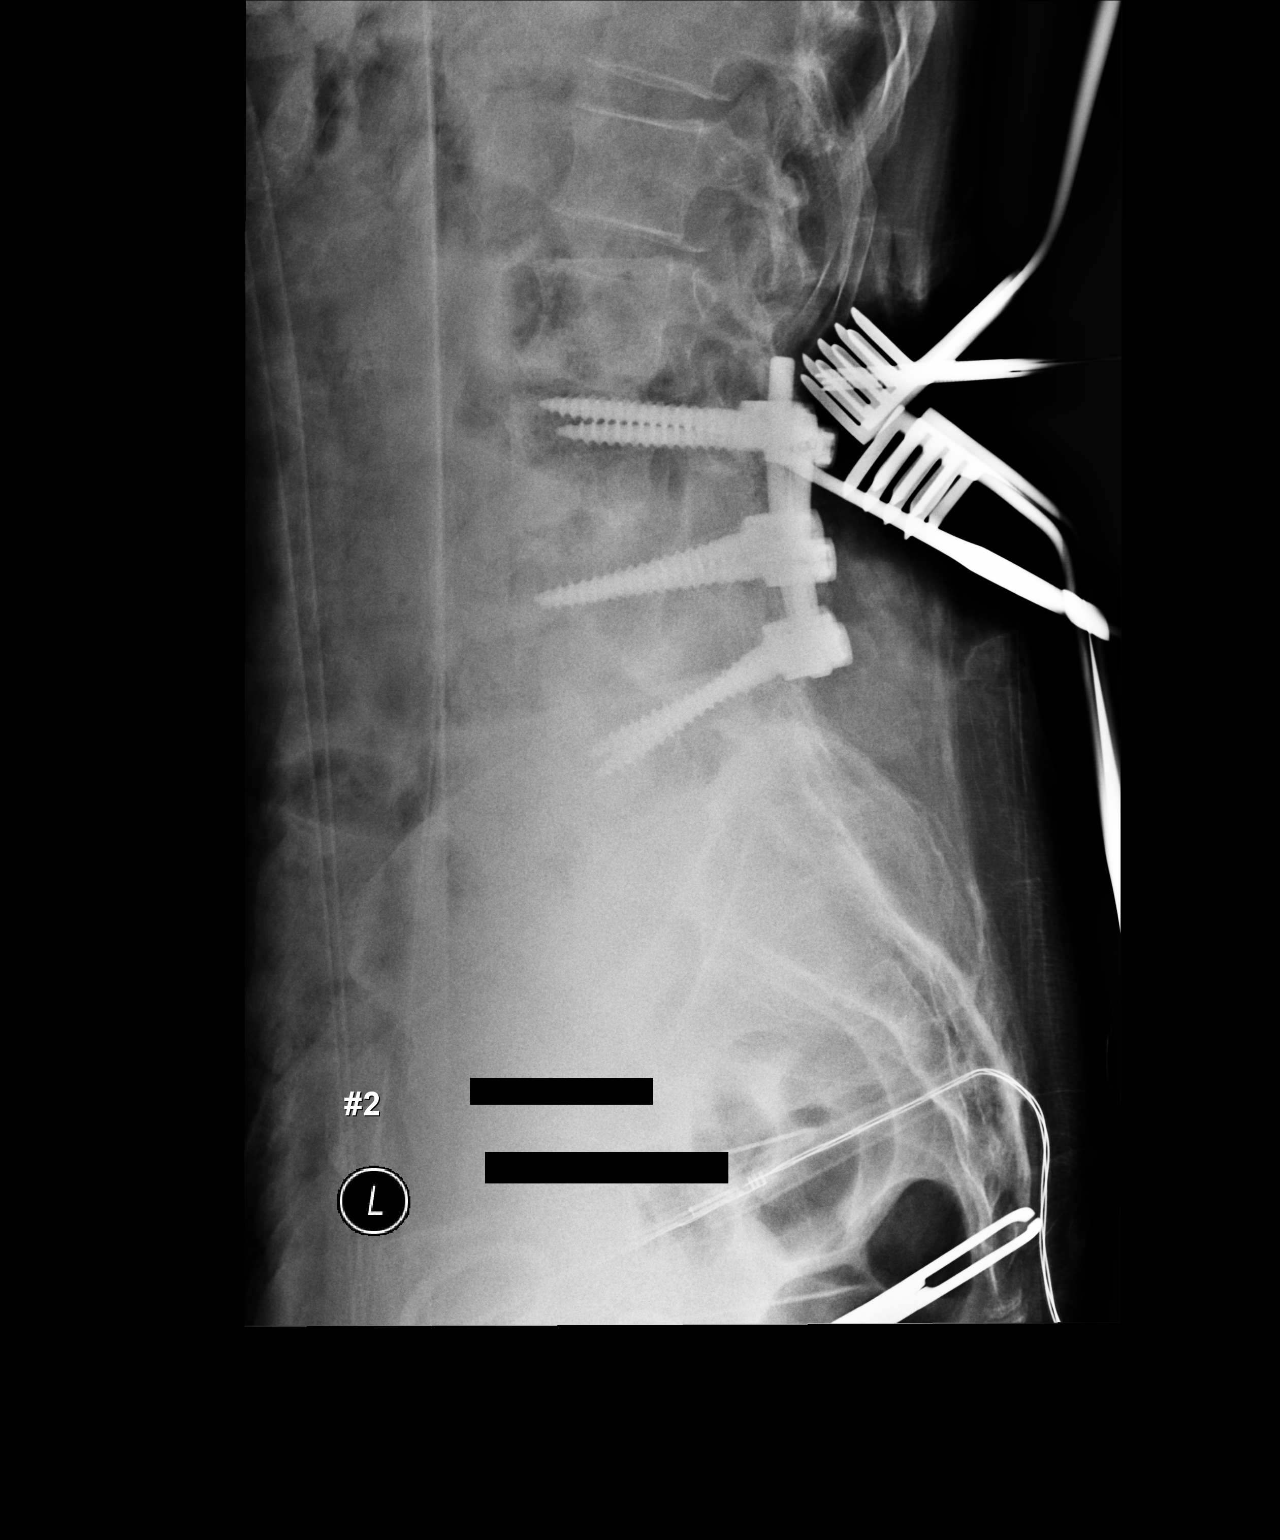

[2 of 2 positions shown; findings below may reference images not displayed]

FINDINGS: Portable cross-table lateral view 1 from the operating room shows
hardware for fusion from L3-L5. A needle is positionedl posteriorly
directed toward the posterior spinous process of L1 with a second
needle beneath the spinous process of L2 affected toward the
posterior elements of L3.

A second portable cross-table lateral view shows and instrument
directed toward the L2-3 interspace.
IMPRESSION: Localization of L2-3 interspace on the last image obtained.

## 2017-02-08 IMAGING — CR DG CHEST 1V PORT
1 series · 1 of 1 positions shown · non-contrast
Comparison: None.

CLINICAL DATA: 60-year-old female with cardiac arrest and chest
pain.

EXAM:
PORTABLE CHEST 1 VIEW

[AP]
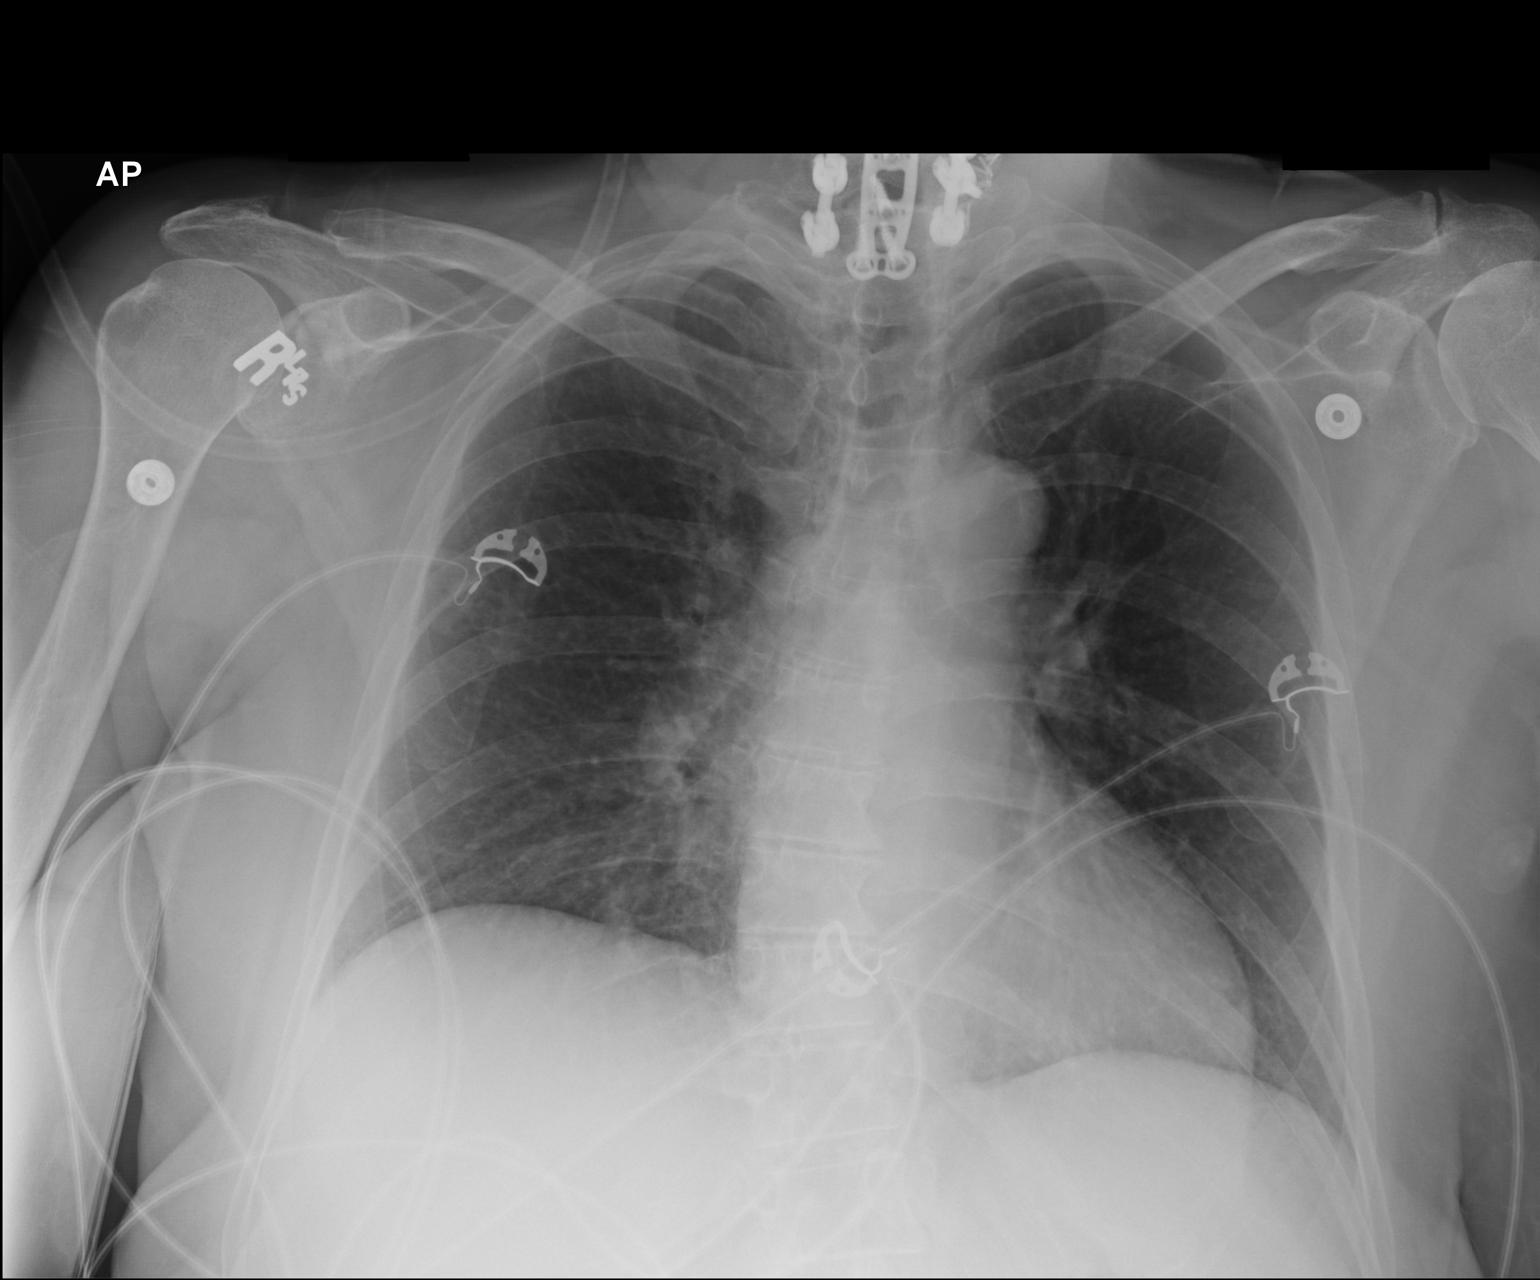

[1 of 1 positions shown; findings below may reference images not displayed]

FINDINGS: The lungs are clear. There is no pleural effusion or pneumothorax.
The cardiac silhouette is within normal limits. Lower cervical
posterior fusion screws and fixation plate. No acute osseous
pathology.
IMPRESSION: No active disease.

## 2017-02-11 ENCOUNTER — Telehealth (HOSPITAL_COMMUNITY): Payer: Self-pay | Admitting: *Deleted

## 2017-02-11 NOTE — Telephone Encounter (Signed)
LEFT VOICE MESSAGE REGARDING AN APPOINTMENT. 

## 2017-02-28 ENCOUNTER — Other Ambulatory Visit: Payer: Self-pay | Admitting: Family Medicine

## 2017-03-03 NOTE — Telephone Encounter (Signed)
Six mo worth ok 

## 2017-03-03 NOTE — Telephone Encounter (Signed)
Last seen 01/07/17

## 2017-03-18 ENCOUNTER — Ambulatory Visit (INDEPENDENT_AMBULATORY_CARE_PROVIDER_SITE_OTHER): Payer: BLUE CROSS/BLUE SHIELD | Admitting: Orthopedic Surgery

## 2017-03-18 ENCOUNTER — Ambulatory Visit (INDEPENDENT_AMBULATORY_CARE_PROVIDER_SITE_OTHER): Payer: BLUE CROSS/BLUE SHIELD

## 2017-03-18 ENCOUNTER — Encounter (INDEPENDENT_AMBULATORY_CARE_PROVIDER_SITE_OTHER): Payer: Self-pay | Admitting: Orthopedic Surgery

## 2017-03-18 DIAGNOSIS — G8929 Other chronic pain: Secondary | ICD-10-CM

## 2017-03-18 DIAGNOSIS — M25512 Pain in left shoulder: Secondary | ICD-10-CM

## 2017-03-19 NOTE — Progress Notes (Signed)
Office Visit Note   Patient: Stephanie Noble           Date of Birth: 1955-08-19           MRN: [id number] Visit Date: 03/18/2017 Requested by: Merlyn Albert, MD [address] [place], [place] [address] PCP: Merlyn Albert, MD  Subjective: Chief Complaint  Patient presents with  . Shoulder Pain    bilateral pain but left worse than right    HPI: Stephanie Noble is a 61 year old patient with bilateral shoulder pain left worse than right.  She cannot get comfortable with the shoulder.  Reports radicular pain radiating down the left arm.  Left shoulder pain is constant right shoulder pain comes and goes.  She describes weakness in the arm and numbness and tingling in the left hand.  She also reports neck pain with previous cervical spine fusion done by Dr. Newell Coral 2014.  She has had previous right shoulder surgery by me.  Neck radiographs done most recently in June 2017.  She describes pain starting this summer doing yard work.  She cannot lay on the left shoulder.  The pain classically radiates to the back of her hand and around the thumb on the left-hand side.              ROS: All systems reviewed are negative as they relate to the chief complaint within the history of present illness.  Patient denies  fevers or chills.   Assessment & Plan: Visit Diagnoses:  1. Chronic left shoulder pain     Plan: Impression is normal shoulder exam with radicular pain going down the arm.  This is most likely related to her neck.  Particularly with the way the radicular symptoms radiate in knee C6 distribution.  Shoulder examination is normal.  I recommend follow-up with neck surgeon.  Follow-Up Instructions: Return if symptoms worsen or fail to improve.   Orders:  Orders Placed This Encounter  Procedures  . XR Shoulder Left   No orders of the defined types were placed in this encounter.     Procedures: No procedures performed   Clinical Data: No additional  findings.  Objective: Vital Signs: There were no vitals taken for this visit.  Physical Exam:   Constitutional: Patient appears well-developed HEENT:  Head: Normocephalic Eyes:EOM are normal Neck: Normal range of motion Cardiovascular: Normal rate Pulmonary/chest: Effort normal Neurologic: Patient is alert Skin: Skin is warm Psychiatric: Patient has normal mood and affect    Ortho Exam: Orthopedic exam demonstrates fairly reasonable neck range of motion with well-healed surgical incision anteriorly.  Motor sensory function to the hand is intact.  Shoulder range of motion on the left passively is normal.  Rotator cuff strength to isolated infraspinatus super space and subscap testing is normal.  No other masses lymph adenopathy or skin changes noted in the shoulder region.  Radial pulses intact in the left hand.  No definite paresthesias C5 T1.  Specialty Comments:  No specialty comments available.  Imaging: Xr Shoulder Left  Result Date: 03/19/2017 AP lateral out the left shoulder reviewed.  Mild acromioclavicular joint degenerative changes noted.  Shoulder is reduced.  No fractures present.  Acromiohumeral distance maintained.  Visualized lung fields clear.    PMFS History: Patient Active Problem List   Diagnosis Date Noted  . Hallux rigidus, left foot 09/15/2016  . Hallux rigidus, right foot 09/15/2016  . Pain in both feet 07/24/2016  . HNP (herniated nucleus pulposus), lumbar 04/16/2016  . Chronic  neck pain 06/22/2015  . Arthritis 01/04/2013  . Insomnia 01/04/2013   Past Medical History:  Diagnosis Date  . Asthma    uses albuterol as needed "mostly weather induced".ALbuterol and Ventolin inhaler as needed  . Chronic back pain   . Chronic neck pain   . Complication of anesthesia   . Degenerative disc disease    cervical and lumbar area  . History of bronchitis 2016  . Insomnia    takes Ambien nightly  . Muscle spasm    takes Robaxin daily   . Pneumonia     hx of-2016  . PONV (postoperative nausea and vomiting)   . Weakness    numbness and tingling in legs    No family history on file.  Past Surgical History:  Procedure Laterality Date  . BACK SURGERY     x 3 lumbar  . CHOLECYSTECTOMY    . DILATION AND CURETTAGE OF UTERUS     x 2  . KNEE SURGERY Left    x 2  . POSTERIOR CERVICAL FUSION/FORAMINOTOMY  05/17/2012   Procedure: POSTERIOR CERVICAL FUSION/FORAMINOTOMY LEVEL 2;  Surgeon: Hewitt Shorts, MD;  Location: [facility];  Service: Neurosurgery;  Laterality: N/A;  cervical six to Thoracic One posterior cervical arthrodesis with instrumentation  . SHOULDER SURGERY     right  . SPINE SURGERY     x 5   Social History   Occupational History  . Not on file.   Social History Main Topics  . Smoking status: Never Smoker  . Smokeless tobacco: Never Used  . Alcohol use No  . Drug use: No  . Sexual activity: Not on file

## 2017-03-24 ENCOUNTER — Ambulatory Visit (INDEPENDENT_AMBULATORY_CARE_PROVIDER_SITE_OTHER): Payer: BLUE CROSS/BLUE SHIELD | Admitting: Neurology

## 2017-03-24 ENCOUNTER — Encounter: Payer: Self-pay | Admitting: Neurology

## 2017-03-24 VITALS — BP 121/86 | HR 71 | Ht 66.0 in | Wt 157.6 lb

## 2017-03-24 DIAGNOSIS — R413 Other amnesia: Secondary | ICD-10-CM

## 2017-03-24 DIAGNOSIS — R419 Unspecified symptoms and signs involving cognitive functions and awareness: Secondary | ICD-10-CM

## 2017-03-24 MED ORDER — ALPRAZOLAM 0.5 MG PO TABS: ORAL_TABLET | ORAL | 0 refills | Status: DC

## 2017-03-24 NOTE — Patient Instructions (Addendum)
MRI brain Labs   

## 2017-03-24 NOTE — Progress Notes (Signed)
[facility]    Provider:  Dr Lucia Gaskins Referring Provider: Merlyn Albert, MD Primary Care Physician:  Merlyn Albert, MD  CC:  Memory changes  HPI:  Stephanie Noble is a 61 y.o. female here as a referral from Dr. Gerda Diss for memory loss. She has surgery and approx 2 months afterwards she noticed memory problems. She was in the ICU for 5 days. She loses things, she has lost sets of keys to their storage units. She has to make lists and then loses the lists. She feels her short term memory is worse since the ordeal. Thought she was just tired but as time progressed the memory issues became more noticeable. Her family has noticed and told her about her memory. More forgetfulness. Here with husband who provides most information. She noticed some behavioral issues like difficulty having the sheets over her head. She will sometimes use the wring words and husband will correct her. Her concentration is different. The lumbar surgeries have significantly improved her life and she can walk. She has headaches, vision disturbances. There is a lot of anxiety. She is worried about hypoxia during her code blue, they had called a chaplain to pray with the family. She has a loss of words. She has started snoring, however no apneic events and not tired or nodding off during the day, snoring is very mild and light. She is having some panic sttacks when it is hot or in closed rooms.  Mom with dementia in her 73s. Patient is here with her husband who also provides much information and asks many questions. No other focal neurologic deficits, associated symptoms, inciting events or modifiable factors.  Reviewed notes, labs and imaging from outside physicians, which showed: Reviewed primary care notes. Patient exhibiting excessive worry, nervousness, anxious behavior and obsessions. Excessive crying forgetfulness. Risk factors include prior traumatic experience and PTSD due to due to experience after  PEA arrest in 2017 status post lumbar surgery. She feels she is losing things, she has diminished energy, smothering sensation at times, more irritable and anxious, crying at times and sad, motion more increased, patient also notes anxiety and nervousness at times, PTSD, feeling worse than ever as far stress and insomnia. Patient worries that being without oxygen has cause something in her brain. Feels overwhelmed.  Review of Systems: Patient complains of symptoms per HPI as well as the following symptoms: memory loss. Pertinent negatives and positives per HPI. All others negative.   Social History   Social History  . Marital status: Married    Spouse name: N/A  . Number of children: N/A  . Years of education: N/A   Occupational History  . Not on file.   Social History Main Topics  . Smoking status: Never Smoker  . Smokeless tobacco: Never Used  . Alcohol use No  . Drug use: No  . Sexual activity: Not on file   Other Topics Concern  . Not on file   Social History Narrative  . No narrative on file    Family History  Problem Relation Age of Onset  . Dementia Mother     Past Medical History:  Diagnosis Date  . Asthma    uses albuterol as needed "mostly weather induced".ALbuterol and Ventolin inhaler as needed  . Chronic back pain   . Chronic neck pain   . Complication of anesthesia   . Degenerative disc disease    cervical and lumbar area  . History of bronchitis 2016  . Insomnia  takes Ambien nightly  . Muscle spasm    takes Robaxin daily   . Pneumonia    hx of-2016  . PONV (postoperative nausea and vomiting)   . Weakness    numbness and tingling in legs    Past Surgical History:  Procedure Laterality Date  . BACK SURGERY     x 3 lumbar  . CHOLECYSTECTOMY    . DILATION AND CURETTAGE OF UTERUS     x 2  . KNEE SURGERY Left    x 2  . POSTERIOR CERVICAL FUSION/FORAMINOTOMY  05/17/2012   Procedure: POSTERIOR CERVICAL FUSION/FORAMINOTOMY LEVEL 2;  Surgeon:  Hewitt Shorts, MD;  Location: [facility];  Service: Neurosurgery;  Laterality: N/A;  cervical six to Thoracic One posterior cervical arthrodesis with instrumentation  . SHOULDER SURGERY     right  . SPINE SURGERY     x 5    Current Outpatient Prescriptions  Medication Sig Dispense Refill  . albuterol (PROVENTIL HFA;VENTOLIN HFA) 108 (90 Base) MCG/ACT inhaler Inhale 2 puffs into the lungs every 6 (six) hours as needed for wheezing or shortness of breath.    Marland Kitchen albuterol (PROVENTIL) (2.5 MG/3ML) 0.083% nebulizer solution Take 3 mLs (2.5 mg total) by nebulization every 4 (four) hours as needed for wheezing or shortness of breath. 150 mL 0  . celecoxib (CELEBREX) 200 MG capsule Take 1 capsule (200 mg total) by mouth daily. (Patient taking differently: Take 200 mg by mouth daily as needed. ) 30 capsule 0  . gabapentin (NEURONTIN) 300 MG capsule Take 900 mg by mouth at bedtime.    . methocarbamol (ROBAXIN) 500 MG tablet Take 500 mg by mouth every 6 (six) hours as needed for muscle spasms.     . VENTOLIN HFA 108 (90 Base) MCG/ACT inhaler USE 2 PUFFS INTO LUNGS EVERY 6 HOURS AS NEEDED FOR SHORTNESS OF BREATH 18 Inhaler 0  . zolpidem (AMBIEN) 10 MG tablet TAKE 1 TABLET BY MOUTH AT BEDTIME AS NEEDED FOR SLEEP 90 tablet 5  . ALPRAZolam (XANAX) 0.5 MG tablet Take 1-2 tablets 30-60 minutes before MRI. May take one additional if needed 3 tablet 0   No current facility-administered medications for this visit.     Allergies as of 03/24/2017 - Review Complete 03/24/2017  Allergen Reaction Noted  . No known allergies  04/15/2016    Vitals: BP 121/86   Pulse 71   Ht  (1.676 m)   Wt 157 lb 9.6 oz (71.5 kg)   BMI 25.44 kg/m  Last Weight:  Wt Readings from Last 1 Encounters:  03/24/17 157 lb 9.6 oz (71.5 kg)   Last Height:   Ht Readings from Last 1 Encounters:  03/24/17  (1.676 m)    Physical exam: Exam: Gen: NAD, conversant, well nourised, well groomed                     CV:  RRR, no MRG. No Carotid Bruits. No peripheral edema, warm, nontender Eyes: Conjunctivae clear without exudates or hemorrhage  Neuro: Detailed Neurologic Exam  Speech:    Speech is normal; fluent and spontaneous with normal comprehension.  Cognition:  MMSE - Mini Mental State Exam 03/24/2017  Orientation to time 5  Orientation to Place 5  Registration 3  Attention/ Calculation 5  Recall 3  Language- name 2 objects 2  Language- repeat 1  Language- follow 3 step command 3  Language- read & follow direction 1  Write a sentence 1  Copy  design 1  Total score 30      The patient is oriented to person, place, and time;     recent and remote memory intact;     language fluent;     normal attention, concentration,     fund of knowledge Cranial Nerves:    The pupils are equal, round, and reactive to light. The fundi are normal and spontaneous venous pulsations are present. Visual fields are full to finger confrontation. Extraocular movements are intact. Trigeminal sensation is intact and the muscles of mastication are normal. The face is symmetric. The palate elevates in the midline. Hearing intact. Voice is normal. Shoulder shrug is normal. The tongue has normal motion without fasciculations.   Coordination:    Normal finger to nose and heel to shin. Normal rapid alternating movements.   Gait:    Heel-toe and tandem gait are normal.   Motor Observation:    No asymmetry, no atrophy, and no involuntary movements noted. Tone:    Normal muscle tone.    Posture:    Posture is normal. normal erect    Strength:    Strength is V/V in the upper and lower limbs.      Sensation: intact to LT     Reflex Exam:  DTR's:    Deep tendon reflexes in the upper and lower extremities are normal bilaterally.   Toes:    The toes are downgoing bilaterally.   Clonus:    Clonus is absent.      Assessment/Plan:  This is a 61 year old patient with a past medical history of multiple lumbar  spine surgeries, reporting cognitive complaints after lumbar surgery complicated admission including PEA cardiac arrest, ICU admission, intubation.  MRI brain w/wo contrast to evaluate for strokes, hypoxic brain injury, or any other etiology of memory loss. Labs today Formal neurocognitive testing if no etiology found, I recommended ordering now but they declined Needs therapy and psychiatry. I suspect normal cognitive aging with stress and anxiety. They will contact me should they change their mind about formal neurocognitive testing with Dr. Alinda Dooms  Orders Placed This Encounter  Procedures  . MR BRAIN W WO CONTRAST  . Dementia Panel  . Basic Metabolic Panel   Cc: Dr. Williemae Natter, MD  [facility] [address] [place], [place] [address]  Phone [phone] Fax [phone]

## 2017-03-25 ENCOUNTER — Encounter: Payer: Self-pay | Admitting: Neurology

## 2017-03-25 DIAGNOSIS — R419 Unspecified symptoms and signs involving cognitive functions and awareness: Secondary | ICD-10-CM | POA: Insufficient documentation

## 2017-03-25 LAB — BASIC METABOLIC PANEL
BUN/Creatinine Ratio: 16 (ref 12–28)
BUN: 13 mg/dL (ref 8–27)
CO2: 28 mmol/L (ref 20–29)
CREATININE: 0.82 mg/dL (ref 0.57–1.00)
Calcium: 9.2 mg/dL (ref 8.7–10.3)
Chloride: 100 mmol/L (ref 96–106)
GFR calc Af Amer: 89 mL/min/{1.73_m2} (ref >59)
GFR calc non Af Amer: 77 mL/min/{1.73_m2} (ref >59)
GLUCOSE: 74 mg/dL (ref 65–99)
Potassium: 3.8 mmol/L (ref 3.5–5.2)
Sodium: 143 mmol/L (ref 134–144)

## 2017-03-25 LAB — DEMENTIA PANEL
Homocysteine: 12.7 umol/L (ref 0.0–15.0)
RPR Ser Ql: NONREACTIVE
TSH: 5.35 u[IU]/mL — ABNORMAL HIGH (ref 0.450–4.500)
VITAMIN B 12: 465 pg/mL (ref 232–1245)

## 2017-03-26 ENCOUNTER — Telehealth: Payer: Self-pay | Admitting: Neurology

## 2017-03-26 NOTE — Telephone Encounter (Signed)
Patient returned call and asked that Baird Lyons RN call her back. [phone]. Please call and advise.

## 2017-03-26 NOTE — Telephone Encounter (Signed)
-----   Message from Anson Fret, MD sent at 03/26/2017  8:33 AM EDT ----- TSH is abnormal may indicate hypothyroidism which affect memory recommend f/u with dr. Gerda Diss for additional testing thanks

## 2017-03-26 NOTE — Telephone Encounter (Signed)
Called and informed her of these results. She verbalized understanding and had no further questions.

## 2017-03-26 NOTE — Telephone Encounter (Signed)
Called and spoke with the patient's husband and gave him the report on the lab work. Pt's husband has asked if the the patient could call back so that we could go over the results with her. I informed his that would be fine and gave him name and number to call back.

## 2017-03-31 ENCOUNTER — Telehealth: Payer: Self-pay | Admitting: Neurology

## 2017-03-31 NOTE — Telephone Encounter (Signed)
Patient has [facility], which is a company that works with her insurance company that covers the images at 100%. They informed me they cover open MRI at 100% and they wanted to make sure if that is okay to have her schedule at [facility] for the open machine so it can be cover at 100%,

## 2017-03-31 NOTE — Telephone Encounter (Signed)
That's fine. thanks

## 2017-03-31 NOTE — Telephone Encounter (Signed)
Noted, thank you

## 2017-04-07 ENCOUNTER — Telehealth: Payer: Self-pay | Admitting: Neurology

## 2017-04-07 NOTE — Telephone Encounter (Signed)
MRI of the brain is unremarkable,normal for age.  reviewed [facility] report. thanks

## 2017-04-08 NOTE — Progress Notes (Signed)
Psychiatric Initial Adult Assessment   Patient Identification: Stephanie Noble MRN:  [id number] Date of Evaluation:  04/13/2017 Referral Source: Dr. Ardyth Gal Chief Complaint:   Chief Complaint    Memory Loss; Anxiety; Psychiatric Evaluation    "I'm in no longer control myself" Visit Diagnosis:    ICD-10-CM   1. PTSD (post-traumatic stress disorder) F43.10     History of Present Illness:   Stephanie Noble is a 61 year old female with multiple lumbar spine surgeries, reporting cognitive complaints after lumbar surgery complicated admission including PEA cardiac arrest, ICU admission, intubation, who is referred for mental health evaluation.   She was evaluated by [facility] in 02/2017; Per chart, "Formal neurocognitive testing if no etiology found, I recommended ordering now but they declined. Needs therapy and psychiatry.I suspect normal cognitive aging with stress and anxiety. Per chart review, she was admitted in 03/2016 for left L2-3 lumbar disc herniation, lumbar stenosis with neurogenic claudication, lumbar scoliosis, umbar spondylosis, lumbar degenerative disc disease; hospital course was complicated by "PEA felt to be due to over sedation, and the general anesthetic."   She states that she came here as "I'm in no longer control myself." She talks about a history of being admitted to the hospital last year for back surgery. She remembers the moment when she became very lethargic, thirsty in the setting of hypotension after surgery. Although the nurse was supposed to check her soon, nobody visited her room per report. Her husband alerted the staff and she ended up having PEA. She also felt invalidated when the report from the nurse was made differently from what she actually experienced. She experienced significant rash on her body after discharge, and it took more than a month for her to finally be able to walk around by herself. It was "torment" moment when  she needed to undergo another surgery in April for her torso, which she was told as "the worst arthritis."  She becomes noticing that she tends to have crying spells for no reason. She does not feel comfortable when her grandchildren plays with blanket and pillow. She feels that she is "not there" reflecting the time with others, although she denies dissociation or racing thought. She had to reject her husband when he tried to hug her; she states that it never happened before. She feels that  "I'm in no longer control myself" and wants to take three steps to "cope with it."  She has chronic insomnia. She has crying spells. She has difficulty with concentration and endorses memory loss. She enjoys being with her grandchildren. She has passive SI. She is afraid of darkness, stating that it reminds her of the feeling of becoming drowsy. She feels irritable. She denies decreased need for sleep or euphoria.   TSH- 5.350 MRI brain: pending (per note, unremarkable, normal for age)  Per PMP,  Ambien 10 mg for 90 days, filled on 03/03/2017, was on Xanax for three days  Associated Signs/Symptoms: Depression Symptoms:  depressed mood, insomnia, fatigue, difficulty concentrating, (Hypo) Manic Symptoms:  denies Anxiety Symptoms:  Excessive Worry, Psychotic Symptoms:  denies PTSD Symptoms: Had a traumatic exposure:  on PEA during admission Re-experiencing:  Intrusive Thoughts Hypervigilance:  Yes Hyperarousal:  Difficulty Concentrating Emotional Numbness/Detachment Irritability/Anger Avoidance:  None avoidance of darkness  Past Psychiatric History:  Outpatient: denies Psychiatry admission: denies  Previous suicide attempt: denies Past trials of medication: denies History of violence: denies  Previous Psychotropic Medications: No   Substance Abuse History in the last  12 months:  No.  Consequences of Substance Abuse: NA  Past Medical History:  Past Medical History:  Diagnosis Date  .  Asthma    uses albuterol as needed "mostly weather induced".ALbuterol and Ventolin inhaler as needed  . Chronic back pain   . Chronic neck pain   . Complication of anesthesia   . Degenerative disc disease    cervical and lumbar area  . History of bronchitis 2016  . Insomnia    takes Ambien nightly  . Muscle spasm    takes Robaxin daily   . Pneumonia    hx of-2016  . PONV (postoperative nausea and vomiting)   . Weakness    numbness and tingling in legs    Past Surgical History:  Procedure Laterality Date  . BACK SURGERY     x 3 lumbar  . CHOLECYSTECTOMY    . DILATION AND CURETTAGE OF UTERUS     x 2  . KNEE SURGERY Left    x 2  . POSTERIOR CERVICAL FUSION/FORAMINOTOMY  05/17/2012   Procedure: POSTERIOR CERVICAL FUSION/FORAMINOTOMY LEVEL 2;  Surgeon: Hewitt Shorts, MD;  Location: [facility];  Service: Neurosurgery;  Laterality: N/A;  cervical six to Thoracic One posterior cervical arthrodesis with instrumentation  . SHOULDER SURGERY     right  . SPINE SURGERY     x 5    Family Psychiatric History:  Three sisters- "mental breakdown", alcohol in extended family  Family History:  Family History  Problem Relation Age of Onset  . Dementia Mother     Social History:   Social History   Social History  . Marital status: Married    Spouse name: N/A  . Number of children: N/A  . Years of education: N/A   Social History Main Topics  . Smoking status: Never Smoker  . Smokeless tobacco: Never Used  . Alcohol use No  . Drug use: No  . Sexual activity: Not on file   Other Topics Concern  . Not on file   Social History Narrative  . No narrative on file    Additional Social History:  Lives with her husband (married since 1975). She has eight grandchildren and one is coming Education: graduated from high school Work: homemaker She was born and grew up in [place]  Allergies:   Allergies  Allergen Reactions  . No Known Allergies     Metabolic Disorder  Labs: No results found for: HGBA1C, MPG No results found for: PROLACTIN No results found for: CHOL, TRIG, HDL, CHOLHDL, VLDL, LDLCALC   Current Medications: Current Outpatient Prescriptions  Medication Sig Dispense Refill  . albuterol (PROVENTIL HFA;VENTOLIN HFA) 108 (90 Base) MCG/ACT inhaler Inhale 2 puffs into the lungs every 6 (six) hours as needed for wheezing or shortness of breath.    Marland Kitchen albuterol (PROVENTIL) (2.5 MG/3ML) 0.083% nebulizer solution Take 3 mLs (2.5 mg total) by nebulization every 4 (four) hours as needed for wheezing or shortness of breath. 150 mL 0  . celecoxib (CELEBREX) 200 MG capsule Take 1 capsule (200 mg total) by mouth daily. (Patient taking differently: Take 200 mg by mouth daily as needed. ) 30 capsule 0  . gabapentin (NEURONTIN) 300 MG capsule Take 300 mg by mouth 2 (two) times daily.    Marland Kitchen zolpidem (AMBIEN) 10 MG tablet TAKE 1 TABLET BY MOUTH AT BEDTIME AS NEEDED FOR SLEEP (Patient taking differently: TAKE 1 TABLET BY MOUTH AT BEDTIME FOR SLEEP) 90 tablet 5  . ALPRAZolam (XANAX) 0.5 MG  tablet Take 1-2 tablets 30-60 minutes before MRI. May take one additional if needed (Patient not taking: Reported on 04/13/2017) 3 tablet 0  . DULoxetine (CYMBALTA) 20 MG capsule Take 1 capsule (20 mg total) by mouth daily. 30 capsule 1  . gabapentin (NEURONTIN) 300 MG capsule Take 900 mg by mouth at bedtime.    . methocarbamol (ROBAXIN) 500 MG tablet Take 500 mg by mouth every 6 (six) hours as needed for muscle spasms.     . VENTOLIN HFA 108 (90 Base) MCG/ACT inhaler USE 2 PUFFS INTO LUNGS EVERY 6 HOURS AS NEEDED FOR SHORTNESS OF BREATH (Patient not taking: Reported on 04/13/2017) 18 Inhaler 0   No current facility-administered medications for this visit.     Neurologic: Headache: No Seizure: No Paresthesias:No  Musculoskeletal: Strength & Muscle Tone: within normal limits Gait & Station: normal Patient leans: N/A  Psychiatric Specialty Exam: Review of Systems   Musculoskeletal: Positive for back pain.  Neurological: Positive for tingling.  Psychiatric/Behavioral: Positive for depression and suicidal ideas. Negative for hallucinations and substance abuse. The patient is nervous/anxious and has insomnia.   All other systems reviewed and are negative.   Blood pressure 125/83, pulse 68, height  (1.676 m), weight 155 lb 6.4 oz (70.5 kg).Body mass index is 25.08 kg/m.  General Appearance: Well Groomed, very pleasant, comfortably sit in the chair, having red nose before starting interview  Eye Contact:  Good  Speech:  Clear and Coherent  Volume:  Normal  Mood:  Anxious  Affect:  Appropriate, Congruent, Tearful and slightly tense, but reactive  Thought Process:  Appropriate, Congruent, Tearful and slightly tense, reactive  Orientation:  Full (Time, Place, and Person)  Thought Content:  Logical  Suicidal Thoughts:  Yes.  without intent/plan  Homicidal Thoughts:  No  Memory:  Immediate;   Good Recent;   Good Remote;   Good  Judgement:  Good  Insight:  Fair  Psychomotor Activity:  Normal  Concentration:  Concentration: Good and Attention Span: Good  Recall:  Good  Fund of Knowledge:Good  Language: Good  Akathisia:  No  Handed:  Right  AIMS (if indicated):  No tremors  Assets:  Communication Skills Desire for Improvement  ADL's:  Intact  Cognition: WNL  Sleep:  poor   Assessment MELYSSA SIGNOR is a 61 year old female with multiple lumbar spine surgeries, reporting cognitive complaints after lumbar surgery complicated admission including PEA cardiac arrest, ICU admission, intubation, who is referred for mental health evaluation.   # PTSD Patient elaborate significant exposure to death in the setting of PEA cardiac arrest and reports symptoms consistent with PTSD. Will start duloxetine to target PTSD and neuropathic pain. Validated her fear while discussing cognitive defusion and acceptance. She will greatly benefit from CBT; referral  is made. Noted that she has elevated TSH; she is advised to check with her PCP.  # r/o cognitive impairment She endorses memory loss and has been evaluated by neurologist. TSH abnormal (she will check with PCP), Vit B12 normal. Folate unchecked. MRI reportedly normal. Discussed with her that PTSD can certainly attribute to memory loss; will continue to monitor.   Plan 1. Start duloxetine 20 mg daily  2. Referral to therapy  3. Return to clinic in one month for 30 mins  The patient demonstrates the following risk factors for suicide: Chronic risk factors for suicide include: psychiatric disorder of PTSD and chronic pain. Acute risk factors for suicide include: N/A. Protective factors for this patient include: positive  social support, responsibility to others (children, family), coping skills and hope for the future. Considering these factors, the overall suicide risk at this point appears to be low. Patient is appropriate for outpatient follow up.   Treatment Plan Summary: Plan as above   Stephanie Hotter, MD 10/15/20189:43 AM

## 2017-04-08 NOTE — Telephone Encounter (Signed)
Called patient and made her aware of MRI results. She verbalized understanding and stated she was planning to f/u with her PCP.

## 2017-04-13 ENCOUNTER — Ambulatory Visit (INDEPENDENT_AMBULATORY_CARE_PROVIDER_SITE_OTHER): Payer: BLUE CROSS/BLUE SHIELD | Admitting: Psychiatry

## 2017-04-13 ENCOUNTER — Encounter (HOSPITAL_COMMUNITY): Payer: Self-pay | Admitting: Psychiatry

## 2017-04-13 VITALS — BP 125/83 | HR 68 | Ht 66.0 in | Wt 155.4 lb

## 2017-04-13 DIAGNOSIS — F4310 Post-traumatic stress disorder, unspecified: Secondary | ICD-10-CM

## 2017-04-13 DIAGNOSIS — F5104 Psychophysiologic insomnia: Secondary | ICD-10-CM

## 2017-04-13 DIAGNOSIS — Z79899 Other long term (current) drug therapy: Secondary | ICD-10-CM | POA: Diagnosis not present

## 2017-04-13 DIAGNOSIS — Z818 Family history of other mental and behavioral disorders: Secondary | ICD-10-CM

## 2017-04-13 DIAGNOSIS — Z8674 Personal history of sudden cardiac arrest: Secondary | ICD-10-CM

## 2017-04-13 MED ORDER — DULOXETINE HCL 20 MG PO CPEP
20.0000 mg | ORAL_CAPSULE | Freq: Every day | ORAL | 1 refills | Status: DC
Start: 2017-04-13 — End: 2017-06-10

## 2017-04-13 NOTE — Patient Instructions (Signed)
1. Start duloxetine 20 mg daily  2. Referral to therapy  3. Return to clinic in one month for 30 mins

## 2017-04-16 ENCOUNTER — Ambulatory Visit (INDEPENDENT_AMBULATORY_CARE_PROVIDER_SITE_OTHER): Payer: BLUE CROSS/BLUE SHIELD | Admitting: Family Medicine

## 2017-04-16 ENCOUNTER — Encounter: Payer: Self-pay | Admitting: Family Medicine

## 2017-04-16 VITALS — BP 124/90 | Ht 66.0 in | Wt 156.0 lb

## 2017-04-16 DIAGNOSIS — R5383 Other fatigue: Secondary | ICD-10-CM | POA: Diagnosis not present

## 2017-04-16 DIAGNOSIS — R7989 Other specified abnormal findings of blood chemistry: Secondary | ICD-10-CM

## 2017-04-16 NOTE — Progress Notes (Signed)
   Subjective:    Patient ID: Stephanie CostainJennifer L Noble, female    DOB: 07/21/1955, 61 y.o.   MRN: [id number]  HPI Patient arrives office for a protracted discussion regarding all of the interventions and follow-up in tests which have occurred since last visit.  Complete record reviewed in presence of patient.  Appreciate neurology assessment. They ordered an MRI. Fortunately this showed no structural difficulties.They also o  Patient reports no familhistory of thyroid disease. No per  Patient also saw a psychiatrist. Cymbalta was initiated for presumed PTSD. Patient has not started it yet. Expresses some reluctance but is going to give it a try  Neurocognitive testing was recommended. Patient declined that.   Patient is here today to discuss neurology consult,that we referred her to. Pt saw Dr. Daisy BlossomAhearn 03/24/2017, they informed the pt of needing to discuss hypothyroidism with us.  Review of Systems No headache, no major weight loss or weight gain, no chest pain no back pain abdominal pain no change in bowel habits complete ROS otherwise negative     Objective:   Physical Exam  Alert and oriented, vitals reviewed and stable, NAD ENT-TM's and ext canals WNL bilat via otoscopic exam Soft palate, tonsils and post pharynx WNL via oropharyngeal exam Neck-symmetric, no masses; thyroid nonpalpable and nontender Pulmonary-no tachypnea or accessory muscle use; Clear without wheezes via auscultation Card--no abnrml murmurs, rhythm reg and rate WNL Carotid pulses symmetric, without bruits       Assessment & Plan:  Impression 1 subacute clinical hypothyroidism. Patient's TSH slightly elevated. I doubt that it has any significant contribution to her current neurocognitive challenges. Discussed with patient. Patient also is not a big exam of medications in general. She wishes to take a conservative path and reassess TSH in 60 days. If TSH further elevated at that point  Rationale discussed.#2 post ICU  neurological/mental healthchanges. Consistent with PTSD. Still query regarding an organic brain disorder secondary to pulmonary failure/resuscitation/prolonged ICUstay.  Greater than 50% of this 25 minute face to face visit was spent in counseling and discussion and coordination of care regarding the above diagnosis/diagnosies

## 2017-05-04 ENCOUNTER — Encounter (HOSPITAL_COMMUNITY): Payer: Self-pay | Admitting: Psychiatry

## 2017-05-04 ENCOUNTER — Ambulatory Visit (HOSPITAL_COMMUNITY): Payer: BLUE CROSS/BLUE SHIELD | Admitting: Psychiatry

## 2017-05-04 DIAGNOSIS — F4310 Post-traumatic stress disorder, unspecified: Secondary | ICD-10-CM | POA: Diagnosis not present
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
## 2017-05-08 NOTE — Progress Notes (Signed)
BH MD/PA/NP OP Progress Note  05/14/2017 11:45 AM Stephanie Noble  MRN:  [id number]  Chief Complaint:  Chief Complaint    Trauma; Follow-up     HPI:  Patient presents for follow up appointment for PTSD. She states that she noticed some difference after starting duloxetine.  She does not feel sad or overwhelmed as he used to.  She feels better being with her grandchild as they are "light" to her parents.  She talks about an episode when she felt "jittery" when she tries to cook. She is concerned as she used to do this for many years and she loves cooking. She feels that she might not be able to do it, although she knows that she can do this. She is encouraged to continue to take action in line with her value and enjoys what she loves while noticing her anxiety rather than battling with anxiety. She does not like the fact of taking medication and she wants to be "responsible." She agrees that she is already responsible for self care by taking medication, and feels there has been a progress, although she felt "stuck" being on medication. She has occasional insomnia and takes Ambien or gabapentin. She feels more motivated and has more energy. She denies SI. She feels anxious at times. She denies panic attacks. She denies nightmares. She has flashback. She has hypervigilance.   Visit Diagnosis:    ICD-10-CM   1. PTSD (post-traumatic stress disorder) F43.10     Past Psychiatric History:  I have reviewed the patient's psychiatry history in detail and updated the patient record. Outpatient: denies Psychiatry admission: denies  Previous suicide attempt: denies Past trials of medication: denies History of violence: denies Had a traumatic exposure:  on PEA during admission  Past Medical History:  Past Medical History:  Diagnosis Date  . Asthma    uses albuterol as needed "mostly weather induced".ALbuterol and Ventolin inhaler as needed  . Chronic back pain   . Chronic neck pain   .  Complication of anesthesia   . Degenerative disc disease    cervical and lumbar area  . History of bronchitis 2016  . Insomnia    takes Ambien nightly  . Muscle spasm    takes Robaxin daily   . Pneumonia    hx of-2016  . PONV (postoperative nausea and vomiting)   . Weakness    numbness and tingling in legs    Past Surgical History:  Procedure Laterality Date  . BACK SURGERY     x 3 lumbar  . CHOLECYSTECTOMY    . DILATION AND CURETTAGE OF UTERUS     x 2  . KNEE SURGERY Left    x 2  . POSTERIOR CERVICAL FUSION/FORAMINOTOMY  05/17/2012   Procedure: POSTERIOR CERVICAL FUSION/FORAMINOTOMY LEVEL 2;  Surgeon: Hewitt Shorts, MD;  Location: [facility];  Service: Neurosurgery;  Laterality: N/A;  cervical six to Thoracic One posterior cervical arthrodesis with instrumentation  . SHOULDER SURGERY     right  . SPINE SURGERY     x 5  . TOE FUSION Left 09/2016    Family Psychiatric History:  I have reviewed the patient's family history in detail and updated the patient record. Three sisters- "mental breakdown", alcohol in extended family  Family History:  Family History  Problem Relation Age of Onset  . Dementia Mother   . Other Sister   . Alcohol abuse Other     Social History:  Social History   Socioeconomic History  .  Marital status: Married    Spouse name: None  . Number of children: None  . Years of education: None  . Highest education level: None  Social Needs  . Financial resource strain: None  . Food insecurity - worry: None  . Food insecurity - inability: None  . Transportation needs - medical: None  . Transportation needs - non-medical: None  Occupational History  . None  Tobacco Use  . Smoking status: Never Smoker  . Smokeless tobacco: Never Used  Substance and Sexual Activity  . Alcohol use: No  . Drug use: No  . Sexual activity: Yes    Birth control/protection: Post-menopausal  Other Topics Concern  . None  Social History Narrative  . None     Allergies:  Allergies  Allergen Reactions  . No Known Allergies     Metabolic Disorder Labs: No results found for: HGBA1C, MPG No results found for: PROLACTIN No results found for: CHOL, TRIG, HDL, CHOLHDL, VLDL, LDLCALC Lab Results  Component Value Date   TSH 5.350 (H) 03/24/2017    Therapeutic Level Labs: No results found for: LITHIUM No results found for: VALPROATE No components found for:  CBMZ  Current Medications: Current Outpatient Medications  Medication Sig Dispense Refill  . albuterol (PROVENTIL HFA;VENTOLIN HFA) 108 (90 Base) MCG/ACT inhaler Inhale 2 puffs into the lungs every 6 (six) hours as needed for wheezing or shortness of breath.    Marland Kitchen. albuterol (PROVENTIL) (2.5 MG/3ML) 0.083% nebulizer solution Take 3 mLs (2.5 mg total) by nebulization every 4 (four) hours as needed for wheezing or shortness of breath. 150 mL 0  . celecoxib (CELEBREX) 200 MG capsule Take 1 capsule (200 mg total) by mouth daily. (Patient taking differently: Take 200 mg by mouth daily as needed. ) 30 capsule 0  . DULoxetine (CYMBALTA) 20 MG capsule Take 1 capsule (20 mg total) by mouth daily. 30 capsule 1  . gabapentin (NEURONTIN) 300 MG capsule Take 300 mg by mouth 2 (two) times daily.    . methocarbamol (ROBAXIN) 500 MG tablet Take 500 mg by mouth every 6 (six) hours as needed for muscle spasms.     Marland Kitchen. zolpidem (AMBIEN) 10 MG tablet TAKE 1 TABLET BY MOUTH AT BEDTIME AS NEEDED FOR SLEEP (Patient taking differently: TAKE 1 TABLET BY MOUTH AT BEDTIME FOR SLEEP) 90 tablet 5   No current facility-administered medications for this visit.      Musculoskeletal: Strength & Muscle Tone: within normal limits Gait & Station: normal Patient leans: N/A  Psychiatric Specialty Exam: Review of Systems  Neurological: Positive for tingling and sensory change.    Blood pressure 130/90, pulse 88, height 5\' 6"  (1.676 m), weight 154 lb (69.9 kg), SpO2 97 %.Body mass index is 24.86 kg/m.  General  Appearance: Fairly Groomed  Eye Contact:  Good  Speech:  Clear and Coherent  Volume:  Normal  Mood:  Anxious  Affect:  Appropriate, Congruent and down but more reactive, calm  Thought Process:  Coherent and Goal Directed  Orientation:  Full (Time, Place, and Person)  Thought Content: Logical Perceptions: denies AH/VH  Suicidal Thoughts:  No  Homicidal Thoughts:  No  Memory:  Immediate;   Good Recent;   Good Remote;   Good  Judgement:  Good  Insight:  Good  Psychomotor Activity:  Normal  Concentration:  Concentration: Good and Attention Span: Good  Recall:  Good  Fund of Knowledge: Good  Language: Good  Akathisia:  No  Handed:  Right  AIMS (if  indicated): not done  Assets:  Communication Skills Desire for Improvement  ADL's:  Intact  Cognition: WNL  Sleep:  Fair   Screenings: Mini-Mental     [facility]  Total Score (max 30 points )  30    PHQ2-9     [facility] Visit [facility]/07/2017 in [facility]  PHQ-2 Total Score  6       Assessment and Plan:  Davene CostainJennifer L Depascale is a 61 y.o. year old female with a history of PTSD, multiple lumbar spine surgeries, reporting cognitive complaints after lumbar surgery complicated admission including PEA cardiac arrest, ICU admission, intubation, who presents for follow up appointment for PTSD (post-traumatic stress disorder)   # PTSD There has been overall improvement in her symptoms since starting duloxetine.  Although she may benefit from higher dose of duloxetine, she reports preference to stay on current dose.  Will continue with duloxetine to target PTSD. Validated her fear and anxiety in relate to her trauma. Discussed acceptance and self compassion. Provided psycho-education of continuing medication at least for nine months to avoid relapse. She will continue to see a therapist.   # r/o neurocognitive impairment Evaluated by neurology in 02/2017. MRI with no significant  abnormality. She will recheck TSH by her PCP. It is difficult to discern in the setting of active mood symptoms. Will continue to monitor and do MOCA as needed.  Plan  1. Continue duloxetine 20 mg daily  2. Return to clinic in one month for 30 mins - recheck in TSH in December by PCP - Patient is on Ambien,prescribed by her PCP  The patient demonstrates the following risk factors for suicide: Chronic risk factors for suicide include: psychiatric disorder of PTSD and chronic pain. Acute risk factors for suicide include: N/A. Protective factors for this patient include: positive social support, responsibility to others (children, family), coping skills and hope for the future. Considering these factors, the overall suicide risk at this point appears to be low. Patient is appropriate for outpatient follow up.  The duration of this appointment visit was 30 minutes of face-to-face time with the patient.  Greater than 50% of this time was spent in counseling, explanation of  diagnosis, planning of further management, and coordination of care.  Neysa Hottereina Jerrianne Hartin, MD 05/14/2017, 11:45 AM

## 2017-05-13 DIAGNOSIS — M546 Pain in thoracic spine: Secondary | ICD-10-CM | POA: Insufficient documentation

## 2017-05-14 ENCOUNTER — Telehealth (INDEPENDENT_AMBULATORY_CARE_PROVIDER_SITE_OTHER): Payer: Self-pay | Admitting: Orthopedic Surgery

## 2017-05-14 ENCOUNTER — Ambulatory Visit (HOSPITAL_COMMUNITY): Payer: BLUE CROSS/BLUE SHIELD | Admitting: Psychiatry

## 2017-05-14 ENCOUNTER — Encounter (HOSPITAL_COMMUNITY): Payer: Self-pay | Admitting: Psychiatry

## 2017-05-14 VITALS — BP 130/90 | HR 88 | Ht 66.0 in | Wt 154.0 lb

## 2017-05-14 DIAGNOSIS — F4310 Post-traumatic stress disorder, unspecified: Secondary | ICD-10-CM

## 2017-05-14 DIAGNOSIS — Z811 Family history of alcohol abuse and dependence: Secondary | ICD-10-CM | POA: Diagnosis not present

## 2017-05-14 DIAGNOSIS — Z810 Family history of intellectual disabilities: Secondary | ICD-10-CM

## 2017-05-14 NOTE — Patient Instructions (Signed)
1. Continue duloxetine 20 mg daily  2. Return to clinic in one month for 30 mins

## 2017-05-14 NOTE — Telephone Encounter (Signed)
This patient needs a surgery scheduled with Dr. Lajoyce Cornersuda, if you could call her back when you have the chance. She said shes also left voicemails. Thanks!  [phone]

## 2017-05-15 NOTE — Telephone Encounter (Signed)
Can you please look over her chart and complete blue sheet.  She would like her right foot done around the 1st part of December.  Thank you.

## 2017-05-19 NOTE — Telephone Encounter (Signed)
Blue sheet completed.

## 2017-05-25 ENCOUNTER — Ambulatory Visit (HOSPITAL_COMMUNITY): Payer: BLUE CROSS/BLUE SHIELD | Admitting: Psychiatry

## 2017-05-25 ENCOUNTER — Encounter (HOSPITAL_COMMUNITY): Payer: Self-pay | Admitting: Psychiatry

## 2017-05-25 DIAGNOSIS — F4310 Post-traumatic stress disorder, unspecified: Secondary | ICD-10-CM

## 2017-05-25 NOTE — Progress Notes (Signed)
   THERAPIST PROGRESS NOTE  Session Time: Monday 05/25/2017 9:07 AM  - 10:05 AM   Participation Level: Active  Behavioral Response: CasualAlertAnxious  Type of Therapy: Individual Therapy  Treatment Goals addressed: Establish rapport, learn and implement calming techniques  Interventions: CBT and Supportive  Summary: Stephanie CostainJennifer L Noble is a 61 y.o. female who  is referred for services by psychiatrist Dr. Vanetta ShawlHisada due to patient experiencing anxiey. Patient reports she began experiencing things that just were out of character for her in December 2017. She reports multiple back surgeries with the last one being in October 2017. She reports having an allergic reaction after her last surgery and being given a shot to address this. She states she think it suppressed her breathing causing her to have   cardiac arrest. She reports nurse did not check on her and fears she would have died if her husband had not been with her. 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 She also reports feeling like she is being smothered when she sees things on TV that are similar to situations in which one would feel smothered. She reports anxiety, irritability, fatigue, difficulty concentrating, sleep difficulty, and memory difficulty.  Patient reports some improvement in mood since last session. She reports less sadness at night but still experiencing anxiety. She is particularly nervous about possibly needing a future medical procedure depending upon the results of her recent MRI. She reports enjoying being around her grandchildren for Thanksgiving but reports little to no involvement with people outside of her family.   Suicidal/Homicidal: Nowithout intent/plan  Therapist Response: Established rapport, reviewed symptoms, discussed stressors, facilitate expression of thoughts and feelings, validated feelings, discussed strengths and supports, developed treatment plan,  begin to provide psychoeducation regarding PTSD, discussed the stress/anxiety response and ways to trigger a relaxation response, discussed rationale for and practiced deep breathing, assigned patient practice 5-10 minutes 2 times per day  Plan: Return again in 2- weeks.  Diagnosis: Axis I: Post Traumatic Stress Disorder    Axis II: No diagnosis    Cassi Jenne, LCSW 05/25/2017

## 2017-06-02 ENCOUNTER — Encounter: Payer: Self-pay | Admitting: Orthopedic Surgery

## 2017-06-02 DIAGNOSIS — M2021 Hallux rigidus, right foot: Secondary | ICD-10-CM | POA: Diagnosis not present

## 2017-06-04 ENCOUNTER — Telehealth (INDEPENDENT_AMBULATORY_CARE_PROVIDER_SITE_OTHER): Payer: Self-pay | Admitting: Orthopedic Surgery

## 2017-06-04 NOTE — Telephone Encounter (Signed)
I called and advised patient, that typically Dr. Lajoyce Cornersuda would like these to remain intact until first post operative appointment. If the dressing is bothering her or soiled it can certainly be removed and dry dressing can be applied. Advised that she could put a bag over her foot if she does shower, she does have shower chair.

## 2017-06-04 NOTE — Telephone Encounter (Signed)
Patient called asked if she can take a shower? Patient also asked if she can change the dressing on her right foot? The number to contact patient is [phone]

## 2017-06-08 ENCOUNTER — Ambulatory Visit (HOSPITAL_COMMUNITY): Payer: Self-pay | Admitting: Psychiatry

## 2017-06-10 ENCOUNTER — Telehealth (HOSPITAL_COMMUNITY): Payer: Self-pay | Admitting: *Deleted

## 2017-06-10 ENCOUNTER — Other Ambulatory Visit (HOSPITAL_COMMUNITY): Payer: Self-pay | Admitting: Psychiatry

## 2017-06-10 MED ORDER — DULOXETINE HCL 20 MG PO CPEP: 20.0000 mg | ORAL_CAPSULE | Freq: Every day | ORAL | 0 refills | Status: DC

## 2017-06-10 NOTE — Telephone Encounter (Signed)
Dr Vanetta ShawlHisada,  Patient called in stating that Cymbalta refill pharmacy said was denied. She stated she's sure it's because she had to   cancel her appointment for this week due to she had foot surgery 06/02/17 & unable to walk( she's on crutches). Has follow up appointment with the foot surgeon on 06/11/17.  Stated she has 2 or 3 capsule left. Asking if we could refill for a few more days?

## 2017-06-10 NOTE — Telephone Encounter (Signed)
Ordered duloxetine for a month

## 2017-06-10 NOTE — Telephone Encounter (Signed)
Ask her to make follow up appointment in one to two months when she is physically able to come. I will prescribe refill until the next appointment.

## 2017-06-10 NOTE — Telephone Encounter (Signed)
Spoke with patient & informed per Dr Vanetta ShawlHisada : Ask her to make follow up appointment in one to two months when she is physically able to come. I will prescribe refill until the next appointment.   **Patient has a appointment 12/19 @ 11:00 am with Gigi GinPeggy & will make a follow up with Dr Vanetta ShawlHisada then**

## 2017-06-10 NOTE — Telephone Encounter (Signed)
LVM per Dr Vanetta ShawlHisada: Ordered duloxetine for a month. Encourage dto make f/u appointment after 06/17/17 appointment with therapist

## 2017-06-11 ENCOUNTER — Encounter (INDEPENDENT_AMBULATORY_CARE_PROVIDER_SITE_OTHER): Payer: Self-pay | Admitting: Family

## 2017-06-11 ENCOUNTER — Ambulatory Visit (INDEPENDENT_AMBULATORY_CARE_PROVIDER_SITE_OTHER): Payer: BLUE CROSS/BLUE SHIELD | Admitting: Orthopedic Surgery

## 2017-06-11 VITALS — Ht 66.0 in | Wt 154.0 lb

## 2017-06-11 DIAGNOSIS — M2021 Hallux rigidus, right foot: Secondary | ICD-10-CM

## 2017-06-11 NOTE — Progress Notes (Signed)
Office Visit Note   Patient: Stephanie CostainJennifer L Byard           Date of Birth: 06/04/1956           MRN: [id number] Visit Date: 06/11/2017              Requested by: Merlyn AlbertLuking, William S, MD [address] [place], [place] [address] PCP: Merlyn AlbertLuking, William S, MD  Chief Complaint  Patient presents with  . Right Foot - Routine Post Op    06/02/17 right foot bunion surgery      HPI: Patient is a 61 year old woman who presents follow-up status post fusion right great toe MTP joint.  Patient states she feels well has no concerns.  Assessment & Plan: Visit Diagnoses:  1. Hallux rigidus, right foot     Plan: We will have her use the postoperative shoe minimize weightbearing continue with elevation and ice.  She will wash the foot daily with soap and water apply 4 x 4 plus an Ace wrap.  At follow-up harvest the sutures and obtained 3 view radiographs of the right foot.  Follow-Up Instructions: Return in about 1 week (around 06/18/2017).   Ortho Exam  Patient is alert, oriented, no adenopathy, well-dressed, normal affect, normal respiratory effort. Examination there is some swelling there is no wound dehiscence no cellulitis no drainage no signs of infection.  Her toe is straight.  A toe pad spacer was placed.  Imaging: No results found. No images are attached to the encounter.  Labs: Lab Results  Component Value Date   REPTSTATUS [id number] FINAL 05/23/2008   CULT NO GROWTH 5 DAYS 05/23/2008    @LABSALLVALUES (HGBA1)@  Body mass index is 24.86 kg/m.  Orders:  No orders of the defined types were placed in this encounter.  No orders of the defined types were placed in this encounter.    Procedures: No procedures performed  Clinical Data: No additional findings.  ROS:  All other systems negative, except as noted in the HPI. Review of Systems  Objective: Vital Signs: Ht 5\' 6"  (1.676 m)   Wt 154 lb (69.9 kg)   BMI 24.86 kg/m   Specialty Comments:  No specialty  comments available.  PMFS History: Patient Active Problem List   Diagnosis Date Noted  . PTSD (post-traumatic stress disorder) 04/13/2017  . Cognitive complaints 03/25/2017  . Hallux rigidus, left foot 09/15/2016  . Hallux rigidus, right foot 09/15/2016  . Pain in both feet 07/24/2016  . HNP (herniated nucleus pulposus), lumbar 04/16/2016  . Chronic neck pain 06/22/2015  . Arthritis 01/04/2013  . Insomnia 01/04/2013   Past Medical History:  Diagnosis Date  . Asthma    uses albuterol as needed "mostly weather induced".ALbuterol and Ventolin inhaler as needed  . Chronic back pain   . Chronic neck pain   . Complication of anesthesia   . Degenerative disc disease    cervical and lumbar area  . History of bronchitis 2016  . Insomnia    takes Ambien nightly  . Muscle spasm    takes Robaxin daily   . Pneumonia    hx of-2016  . PONV (postoperative nausea and vomiting)   . Weakness    numbness and tingling in legs    Family History  Problem Relation Age of Onset  . Dementia Mother   . Other Sister   . Alcohol abuse Other     Past Surgical History:  Procedure Laterality Date  . BACK SURGERY  x 3 lumbar  . CHOLECYSTECTOMY    . DILATION AND CURETTAGE OF UTERUS     x 2  . KNEE SURGERY Left    x 2  . POSTERIOR CERVICAL FUSION/FORAMINOTOMY  05/17/2012   Procedure: POSTERIOR CERVICAL FUSION/FORAMINOTOMY LEVEL 2;  Surgeon: Hewitt Shortsobert W Nudelman, MD;  Location: [facility];  Service: Neurosurgery;  Laterality: N/A;  cervical six to Thoracic One posterior cervical arthrodesis with instrumentation  . SHOULDER SURGERY     right  . SPINE SURGERY     x 5  . TOE FUSION Left 09/2016   Social History   Occupational History  . Not on file  Tobacco Use  . Smoking status: Never Smoker  . Smokeless tobacco: Never Used  Substance and Sexual Activity  . Alcohol use: No  . Drug use: No  . Sexual activity: Yes    Birth control/protection: Post-menopausal

## 2017-06-12 ENCOUNTER — Ambulatory Visit (HOSPITAL_COMMUNITY): Payer: Self-pay | Admitting: Psychiatry

## 2017-06-15 ENCOUNTER — Ambulatory Visit (INDEPENDENT_AMBULATORY_CARE_PROVIDER_SITE_OTHER): Payer: BLUE CROSS/BLUE SHIELD | Admitting: Family

## 2017-06-17 ENCOUNTER — Ambulatory Visit (HOSPITAL_COMMUNITY): Payer: BLUE CROSS/BLUE SHIELD | Admitting: Psychiatry

## 2017-06-18 ENCOUNTER — Ambulatory Visit (INDEPENDENT_AMBULATORY_CARE_PROVIDER_SITE_OTHER): Payer: BLUE CROSS/BLUE SHIELD | Admitting: Orthopedic Surgery

## 2017-06-18 ENCOUNTER — Encounter (INDEPENDENT_AMBULATORY_CARE_PROVIDER_SITE_OTHER): Payer: Self-pay | Admitting: Orthopedic Surgery

## 2017-06-18 ENCOUNTER — Ambulatory Visit (INDEPENDENT_AMBULATORY_CARE_PROVIDER_SITE_OTHER): Payer: BLUE CROSS/BLUE SHIELD

## 2017-06-18 DIAGNOSIS — M2021 Hallux rigidus, right foot: Secondary | ICD-10-CM

## 2017-06-18 MED ORDER — HYDROCODONE-ACETAMINOPHEN 5-325 MG PO TABS: 1.0000 | ORAL_TABLET | Freq: Four times a day (QID) | ORAL | 0 refills | Status: DC | PRN

## 2017-06-18 NOTE — Progress Notes (Signed)
Office Visit Note   Patient: Stephanie Noble           Date of Birth: 06/14/1956           MRN: [id number] Visit Date: 06/18/2017              Requested by: Merlyn AlbertLuking, William S, MD [address] [place], [place] [address] PCP: Merlyn AlbertLuking, William S, MD  Chief Complaint  Patient presents with  . Right Foot - Routine Post Op    06/02/17 Right Foot Fusion, GT MTP      HPI: Patient is a 61 year old woman who presents 2 weeks status post fusion right great toe MTP joint.  Patient states she feels well has no concerns. Has been minimizing weight bearing heel walking with crutches.  Assessment & Plan: Visit Diagnoses:  1. Hallux rigidus of right foot     Plan: We will have her use the postoperative shoe minimize weightbearing continue with elevation and ice.  She will wash the foot daily with soap and water apply 4 x 4 plus an Ace wrap.   Follow-Up Instructions: Return in about 2 weeks (around 07/02/2017).   Ortho Exam  Patient is alert, oriented, no adenopathy, well-dressed, normal affect, normal respiratory effort. Examination there is mild swelling. Incision well healed. Sutures harvested. no cellulitis no drainage no signs of infection.  Her toe is straight.    Imaging: No results found. No images are attached to the encounter.  Labs: Lab Results  Component Value Date   REPTSTATUS [id number] FINAL 05/23/2008   CULT NO GROWTH 5 DAYS 05/23/2008    @LABSALLVALUES (HGBA1)@  There is no height or weight on file to calculate BMI.  Orders:  Orders Placed This Encounter  Procedures  . XR Foot Complete Right   Meds ordered this encounter  Medications  . HYDROcodone-acetaminophen (NORCO/VICODIN) 5-325 MG tablet    Sig: Take 1 tablet by mouth every 6 (six) hours as needed for moderate pain.    Dispense:  30 tablet    Refill:  0     Procedures: No procedures performed  Clinical Data: No additional findings.  ROS:  All other systems negative, except as noted in  the HPI. Review of Systems  Objective: Vital Signs: There were no vitals taken for this visit.  Specialty Comments:  No specialty comments available.  PMFS History: Patient Active Problem List   Diagnosis Date Noted  . PTSD (post-traumatic stress disorder) 04/13/2017  . Cognitive complaints 03/25/2017  . Hallux rigidus, left foot 09/15/2016  . Hallux rigidus, right foot 09/15/2016  . Pain in both feet 07/24/2016  . HNP (herniated nucleus pulposus), lumbar 04/16/2016  . Chronic neck pain 06/22/2015  . Arthritis 01/04/2013  . Insomnia 01/04/2013   Past Medical History:  Diagnosis Date  . Asthma    uses albuterol as needed "mostly weather induced".ALbuterol and Ventolin inhaler as needed  . Chronic back pain   . Chronic neck pain   . Complication of anesthesia   . Degenerative disc disease    cervical and lumbar area  . History of bronchitis 2016  . Insomnia    takes Ambien nightly  . Muscle spasm    takes Robaxin daily   . Pneumonia    hx of-2016  . PONV (postoperative nausea and vomiting)   . Weakness    numbness and tingling in legs    Family History  Problem Relation Age of Onset  . Dementia Mother   . Other Sister   .  Alcohol abuse Other     Past Surgical History:  Procedure Laterality Date  . BACK SURGERY     x 3 lumbar  . CHOLECYSTECTOMY    . DILATION AND CURETTAGE OF UTERUS     x 2  . KNEE SURGERY Left    x 2  . POSTERIOR CERVICAL FUSION/FORAMINOTOMY  05/17/2012   Procedure: POSTERIOR CERVICAL FUSION/FORAMINOTOMY LEVEL 2;  Surgeon: Hewitt Shortsobert W Nudelman, MD;  Location: [facility];  Service: Neurosurgery;  Laterality: N/A;  cervical six to Thoracic One posterior cervical arthrodesis with instrumentation  . SHOULDER SURGERY     right  . SPINE SURGERY     x 5  . TOE FUSION Left 09/2016   Social History   Occupational History  . Not on file  Tobacco Use  . Smoking status: Never Smoker  . Smokeless tobacco: Never Used  Substance and Sexual  Activity  . Alcohol use: No  . Drug use: No  . Sexual activity: Yes    Birth control/protection: Post-menopausal

## 2017-06-24 ENCOUNTER — Ambulatory Visit (HOSPITAL_COMMUNITY): Payer: Self-pay | Admitting: Psychiatry

## 2017-06-25 ENCOUNTER — Telehealth (HOSPITAL_COMMUNITY): Payer: Self-pay | Admitting: *Deleted

## 2017-06-25 NOTE — Telephone Encounter (Signed)
phone call from patient, she was very upset,said she came to office on 06/24/17 and office was closed.   Apology was offered to her and It was explained to her that her appointment for 06/24/17 was cancelled on 06/03/17.  I did not see a note in system to clarify that the phone call was made, however, normally the appointments are not cancelled before a phone call is made to notify patients of cancellation.  I asked her if she had received a voice message and she said she don't know.

## 2017-06-26 ENCOUNTER — Ambulatory Visit (HOSPITAL_COMMUNITY): Payer: BLUE CROSS/BLUE SHIELD | Admitting: Psychiatry

## 2017-06-26 ENCOUNTER — Encounter (HOSPITAL_COMMUNITY): Payer: Self-pay | Admitting: Psychiatry

## 2017-06-26 VITALS — BP 119/79 | HR 71 | Ht 66.0 in | Wt 153.0 lb

## 2017-06-26 DIAGNOSIS — Z818 Family history of other mental and behavioral disorders: Secondary | ICD-10-CM | POA: Diagnosis not present

## 2017-06-26 DIAGNOSIS — Z811 Family history of alcohol abuse and dependence: Secondary | ICD-10-CM | POA: Diagnosis not present

## 2017-06-26 DIAGNOSIS — F4310 Post-traumatic stress disorder, unspecified: Secondary | ICD-10-CM | POA: Diagnosis not present

## 2017-06-26 DIAGNOSIS — Z79899 Other long term (current) drug therapy: Secondary | ICD-10-CM | POA: Diagnosis not present

## 2017-06-26 DIAGNOSIS — F5102 Adjustment insomnia: Secondary | ICD-10-CM

## 2017-06-26 MED ORDER — DULOXETINE HCL 20 MG PO CPEP: 20.0000 mg | ORAL_CAPSULE | Freq: Every day | ORAL | 0 refills | Status: DC

## 2017-06-26 NOTE — Progress Notes (Signed)
BH MD/PA/NP OP Progress Note  06/26/2017 9:19 AM Stephanie CostainJennifer L Noble  MRN:  [id number]  Chief Complaint:  Chief Complaint    Anxiety; Follow-up; Depression     HPI:  Patient presents for follow-up appointment for PTSD.  She states that she has felt "jazzed up," feeling "closed in" and needed to leave the situation when she was with her family. Although it has been getting better, it is difficult for her to be around with many people. She has "mistrust" to health care staff due to experience she had at surgery last year. She reports dissociation (less intense lately) especially last year when her family visited her. She wants to understand meaning of it and agrees it may have been secondary to her defense mechanism. She feels down that she is not able to do gardening anymore due to her physical limitation and pain. She believes her surgery went well this time and hopes to get it more improved. She has occasional anhedonia and feels fatigue. She feels anxious and tense. She has fair concentration. She feels down at times. She denies SI. She denies nightmares. She reports middle and initial insomnia. She has hypervigilance and flashback. She hopes to stay on current dose of medication as she does not like to be dependent on medication.   Visit Diagnosis:    ICD-10-CM   1. PTSD (post-traumatic stress disorder) F43.10     Past Psychiatric History:  I have reviewed the patient's psychiatry history in detail and updated the patient record. Psychiatry admission:denies Previous suicide attempt: denies Past trials of medication:denies History of violence:denies Had a traumatic exposure:on PEA during admission   Past Medical History:  Past Medical History:  Diagnosis Date  . Asthma    uses albuterol as needed "mostly weather induced".ALbuterol and Ventolin inhaler as needed  . Chronic back pain   . Chronic neck pain   . Complication of anesthesia   . Degenerative disc disease    cervical  and lumbar area  . History of bronchitis 2016  . Insomnia    takes Ambien nightly  . Muscle spasm    takes Robaxin daily   . Pneumonia    hx of-2016  . PONV (postoperative nausea and vomiting)   . Weakness    numbness and tingling in legs    Past Surgical History:  Procedure Laterality Date  . BACK SURGERY     x 3 lumbar  . CHOLECYSTECTOMY    . DILATION AND CURETTAGE OF UTERUS     x 2  . KNEE SURGERY Left    x 2  . POSTERIOR CERVICAL FUSION/FORAMINOTOMY  05/17/2012   Procedure: POSTERIOR CERVICAL FUSION/FORAMINOTOMY LEVEL 2;  Surgeon: Hewitt Shortsobert W Nudelman, MD;  Location: [facility];  Service: Neurosurgery;  Laterality: N/A;  cervical six to Thoracic One posterior cervical arthrodesis with instrumentation  . SHOULDER SURGERY     right  . SPINE SURGERY     x 5  . TOE FUSION Left 09/2016    Family Psychiatric History: I have reviewed the patient's family history in detail and updated the patient record. Three sisters- "mental breakdown", alcohol in extended family  Family History:  Family History  Problem Relation Age of Onset  . Dementia Mother   . Other Sister   . Alcohol abuse Other     Social History:  Social History   Socioeconomic History  . Marital status: Married    Spouse name: None  . Number of children: None  . Years of education:  None  . Highest education level: None  Social Needs  . Financial resource strain: None  . Food insecurity - worry: None  . Food insecurity - inability: None  . Transportation needs - medical: None  . Transportation needs - non-medical: None  Occupational History  . None  Tobacco Use  . Smoking status: Never Smoker  . Smokeless tobacco: Never Used  Substance and Sexual Activity  . Alcohol use: No  . Drug use: No  . Sexual activity: Yes    Birth control/protection: Post-menopausal  Other Topics Concern  . None  Social History Narrative  . None    Allergies:  Allergies  Allergen Reactions  . No Known Allergies      Metabolic Disorder Labs: No results found for: HGBA1C, MPG No results found for: PROLACTIN No results found for: CHOL, TRIG, HDL, CHOLHDL, VLDL, LDLCALC Lab Results  Component Value Date   TSH 5.350 (H) 03/24/2017    Therapeutic Level Labs: No results found for: LITHIUM No results found for: VALPROATE No components found for:  CBMZ  Current Medications: Current Outpatient Medications  Medication Sig Dispense Refill  . albuterol (PROVENTIL HFA;VENTOLIN HFA) 108 (90 Base) MCG/ACT inhaler Inhale 2 puffs into the lungs every 6 (six) hours as needed for wheezing or shortness of breath.    Marland Kitchen albuterol (PROVENTIL) (2.5 MG/3ML) 0.083% nebulizer solution Take 3 mLs (2.5 mg total) by nebulization every 4 (four) hours as needed for wheezing or shortness of breath. 150 mL 0  . celecoxib (CELEBREX) 200 MG capsule Take 1 capsule (200 mg total) by mouth daily. (Patient taking differently: Take 200 mg by mouth daily as needed. ) 30 capsule 0  . DULoxetine (CYMBALTA) 20 MG capsule Take 1 capsule (20 mg total) by mouth daily. 90 capsule 0  . gabapentin (NEURONTIN) 300 MG capsule Take 300 mg by mouth 2 (two) times daily.    Marland Kitchen HYDROcodone-acetaminophen (NORCO/VICODIN) 5-325 MG tablet Take 1 tablet by mouth every 6 (six) hours as needed for moderate pain. 30 tablet 0  . methocarbamol (ROBAXIN) 500 MG tablet Take 500 mg by mouth every 6 (six) hours as needed for muscle spasms.     Marland Kitchen zolpidem (AMBIEN) 10 MG tablet TAKE 1 TABLET BY MOUTH AT BEDTIME AS NEEDED FOR SLEEP (Patient taking differently: TAKE 1 TABLET BY MOUTH AT BEDTIME FOR SLEEP) 90 tablet 5   No current facility-administered medications for this visit.      Musculoskeletal: Strength & Muscle Tone: within normal limits Gait & Station: normal Patient leans: N/A  Psychiatric Specialty Exam: Review of Systems  Psychiatric/Behavioral: Positive for depression. Negative for hallucinations, memory loss, substance abuse and suicidal ideas. The  patient is nervous/anxious and has insomnia.   All other systems reviewed and are negative.   Blood pressure 119/79, pulse 71, height 5\' 6"  (1.676 m), weight 153 lb (69.4 kg), SpO2 98 %.Body mass index is 24.69 kg/m.  General Appearance: Fairly Groomed  Eye Contact:  Good  Speech:  Clear and Coherent  Volume:  Normal  Mood:  Depressed  Affect:  Appropriate, Congruent, Tearful and down, more reactive  Thought Process:  Coherent and Goal Directed  Orientation:  Full (Time, Place, and Person)  Thought Content: Logical   Suicidal Thoughts:  No  Homicidal Thoughts:  No  Memory:  Immediate;   Fair Recent;   Fair Remote;   Fair  Judgement:  Good  Insight:  Good  Psychomotor Activity:  Normal  Concentration:  Concentration: Good and Attention Span: Good  Recall:  Good  Fund of Knowledge: Good  Language: Good  Akathisia:  No  Handed:  Right  AIMS (if indicated): not done  Assets:  Communication Skills Desire for Improvement  ADL's:  Intact  Cognition: WNL  Sleep:  Poor   Screenings: Mini-Mental     Office Visit from 03/24/2017 in [facility]  Total Score (max 30 points )  30    PHQ2-9     [facility]  PHQ-2 Total Score  6       Assessment and Plan:  Stephanie CostainJennifer L Bornemann is a 61 y.o. year old female with a history of PTSD, multiple lumbar spine surgeries, reporting cognitive complaints after lumbar surgery complicated admission including PEA cardiac arrest, ICU admission, intubation, who presents for follow up appointment for PTSD (post-traumatic stress disorder)   # PTSD Patient continues to endorse PTSD symptoms, there has been gradual improvement since starting duloxetine.  Although she will benefit from a higher dose of duloxetine, she prefers to stay on current dose.  We will continue duloxetine for PTSD.  Discussed self compassion.  Discussed negative appraisal of trauma.  Provided psycho education of  continuing medication at least for 9 months to avoid relapse.  She will continue to see a therapist.   # r/o neurocognitive impairment She was evaluated by neurology in September 2018.  MRI was no significant abnormality.  She will recheck TSH by her PCP.  It is difficult to discern in the setting of active PTSD symptoms.  We will continue to monitor and do Moca as needed  Plan  I have reviewed and updated plans as below 1. Continue duloxetine 20 mg daily  2. Return to clinic in two moths for 30 mins - recheck in TSH in December by PCP - Patient is on Ambien,prescribed by her PCP  The patient demonstrates the following risk factors for suicide: Chronic risk factors for suicide include:psychiatric disorder ofPTSDand chronic pain. Acute risk factorsfor suicide include: N/A. Protective factorsfor this patient include: positive social support, responsibility to others (children, family), coping skills and hope for the future. Considering these factors, the overall suicide risk at this point appears to below. Patientisappropriate for outpatient follow up.  The duration of this appointment visit was 30 minutes of face-to-face time with the patient.  Greater than 50% of this time was spent in counseling, explanation of  diagnosis, planning of further management, and coordination of care.  Neysa Hottereina Remer Couse, MD 06/26/2017, 9:19 AM

## 2017-06-26 NOTE — Patient Instructions (Signed)
1. Continue duloxetine 20 mg daily  2. Return to clinic in two moths for 30 mins

## 2017-06-27 LAB — TSH: TSH: 4 u[IU]/mL (ref 0.450–4.500)

## 2017-07-08 ENCOUNTER — Ambulatory Visit (HOSPITAL_COMMUNITY): Payer: Self-pay | Admitting: Psychiatry

## 2017-07-14 ENCOUNTER — Ambulatory Visit (HOSPITAL_COMMUNITY): Payer: Self-pay | Admitting: Psychiatry

## 2017-07-16 ENCOUNTER — Ambulatory Visit (INDEPENDENT_AMBULATORY_CARE_PROVIDER_SITE_OTHER): Payer: BLUE CROSS/BLUE SHIELD

## 2017-07-16 ENCOUNTER — Encounter (INDEPENDENT_AMBULATORY_CARE_PROVIDER_SITE_OTHER): Payer: Self-pay | Admitting: Orthopedic Surgery

## 2017-07-16 ENCOUNTER — Ambulatory Visit (INDEPENDENT_AMBULATORY_CARE_PROVIDER_SITE_OTHER): Payer: BLUE CROSS/BLUE SHIELD | Admitting: Orthopedic Surgery

## 2017-07-16 VITALS — Ht 66.0 in | Wt 153.0 lb

## 2017-07-16 DIAGNOSIS — Q7031 Webbed toes, right foot: Secondary | ICD-10-CM

## 2017-07-16 DIAGNOSIS — Q7032 Webbed toes, left foot: Secondary | ICD-10-CM | POA: Diagnosis not present

## 2017-07-16 DIAGNOSIS — Q7020 Fused toes, unspecified foot: Secondary | ICD-10-CM

## 2017-07-16 DIAGNOSIS — M2021 Hallux rigidus, right foot: Secondary | ICD-10-CM

## 2017-07-16 NOTE — Progress Notes (Signed)
Office Visit Note   Patient: Stephanie Noble           Date of Birth: [birth year]           MRN: [id number] Visit Date: 07/16/2017              Requested by: Merlyn Albert, MD [address] [place], [place] [address] PCP: Merlyn Albert, MD  Chief Complaint  Patient presents with  . Right Foot - Routine Post Op    06/02/17 right foot fusion GT MTP       HPI: Patient is a 62 year old woman who presents 6 weeks status post right great toe MTP fusion she states she does have swelling and pain with weightbearing when she is not using her postoperative shoe.  Patient states she feels like a screw head is prominent and may be backing out of her left great toe fusion.  Assessment & Plan: Visit Diagnoses:  1. Fusion of toes   2. Hallux rigidus of right foot     Plan: Examination the hardware in both feet is stable with no hardware failure no screw backing out.  Discussed that if she is symptomatic from the left great toe MTP fusion we could plan for surgical intervention for removal of the deep hardware.  Follow-Up Instructions: Return in about 4 weeks (around 08/13/2017).   Ortho Exam  Patient is alert, oriented, no adenopathy, well-dressed, normal affect, normal respiratory effort. Examination patient has no swelling of the left foot there is no redness the fusion site is stable.  I can palpate a screw head but it does not appear to be prominent there is a little bit of discoloration dorsally over the screw head.  Examination of the right foot there is swelling at the MTP joint there is no palpable hardware there is no tenderness to palpation there is no ascending cellulitis.  The fusion site is stable with attempted distraction.  Imaging: Xr Toe Great Left  Result Date: 07/16/2017 2 view radiographs of the left great toe shows stable healing of the MTP fusion no fibrous union no hardware failure no backing out of the screws.  Xr Toe Great Right  Result Date:  07/16/2017 2 view radiographs of the right great toe MTP fusion shows no backing out of the hardware the fusion site is stable no fibrous nonunion no hardware failure.  No images are attached to the encounter.  Labs: Lab Results  Component Value Date   REPTSTATUS [id number] FINAL 05/23/2008   CULT NO GROWTH 5 DAYS 05/23/2008    @LABSALLVALUES (HGBA1)@  Body mass index is 24.69 kg/m.  Orders:  Orders Placed This Encounter  Procedures  . XR Toe Great Left  . XR Toe Great Right   No orders of the defined types were placed in this encounter.    Procedures: No procedures performed  Clinical Data: No additional findings.  ROS:  All other systems negative, except as noted in the HPI. Review of Systems  Objective: Vital Signs: Ht 5\' 6"  (1.676 m)   Wt 153 lb (69.4 kg)   BMI 24.69 kg/m   Specialty Comments:  No specialty comments available.  PMFS History: Patient Active Problem List   Diagnosis Date Noted  . PTSD (post-traumatic stress disorder) 04/13/2017  . Cognitive complaints 03/25/2017  . Hallux rigidus, left foot 09/15/2016  . Hallux rigidus, right foot 09/15/2016  . Pain in both feet 07/24/2016  . HNP (herniated nucleus pulposus), lumbar 04/16/2016  . Chronic neck  pain 06/22/2015  . Arthritis 01/04/2013  . Insomnia 01/04/2013   Past Medical History:  Diagnosis Date  . Asthma    uses albuterol as needed "mostly weather induced".ALbuterol and Ventolin inhaler as needed  . Chronic back pain   . Chronic neck pain   . Complication of anesthesia   . Degenerative disc disease    cervical and lumbar area  . History of bronchitis 2016  . Insomnia    takes Ambien nightly  . Muscle spasm    takes Robaxin daily   . Pneumonia    hx of-2016  . PONV (postoperative nausea and vomiting)   . Weakness    numbness and tingling in legs    Family History  Problem Relation Age of Onset  . Dementia Mother   . Other Sister   . Alcohol abuse Other     Past Surgical  History:  Procedure Laterality Date  . BACK SURGERY     x 3 lumbar  . CHOLECYSTECTOMY    . DILATION AND CURETTAGE OF UTERUS     x 2  . KNEE SURGERY Left    x 2  . POSTERIOR CERVICAL FUSION/FORAMINOTOMY  05/17/2012   Procedure: POSTERIOR CERVICAL FUSION/FORAMINOTOMY LEVEL 2;  Surgeon: Hewitt Shortsobert W Nudelman, MD;  Location: [facility];  Service: Neurosurgery;  Laterality: N/A;  cervical six to Thoracic One posterior cervical arthrodesis with instrumentation  . SHOULDER SURGERY     right  . SPINE SURGERY     x 5  . TOE FUSION Left 09/2016   Social History   Occupational History  . Not on file  Tobacco Use  . Smoking status: Never Smoker  . Smokeless tobacco: Never Used  Substance and Sexual Activity  . Alcohol use: No  . Drug use: No  . Sexual activity: Yes    Birth control/protection: Post-menopausal

## 2017-08-12 DIAGNOSIS — M961 Postlaminectomy syndrome, not elsewhere classified: Secondary | ICD-10-CM | POA: Insufficient documentation

## 2017-08-13 ENCOUNTER — Ambulatory Visit (INDEPENDENT_AMBULATORY_CARE_PROVIDER_SITE_OTHER): Payer: BLUE CROSS/BLUE SHIELD | Admitting: Orthopedic Surgery

## 2017-08-17 NOTE — Progress Notes (Signed)
BH MD/PA/NP OP Progress Note  08/18/2017 4:47 PM Stephanie Noble  MRN:  [id number]  Chief Complaint:  Chief Complaint    Follow-up; Trauma     HPI:  Patient presents for follow-up appointment for PTSD Patient presents for follow up appointment for PTSD. She states that she is wondering if she could try higher dose of duloxetine, which was recommended by her pain specialist. She would like to try anything if she can avoid neck surgery. She wonders that if it is "not normal" to over think things. She feels frustrated that he "can't choose your life" after having surgery. She states that she is not able to do things as others do ("spur the moment", such as going to a restaurant.) She agrees that she would try to take a little step she can take to be the person she wants to be, while acknowledging loss/limitation due to surgery. She reports initial insomnia. She has been on Ambien for two years; she reports limited benefit from it compared to before. She feels more motivated. She has fair appetite. She denies SI. She feels anxious and tense. She feels overwhelmed and has panic attacks when she is in closed place. She denies nightmares. She has hypervigilance and flashbacks.    Visit Diagnosis:    ICD-10-CM   1. PTSD (post-traumatic stress disorder) F43.10     Past Psychiatric History:  I have reviewed the patient's psychiatry history in detail and updated the patient record. Psychiatry admission:denies Previous suicide attempt: denies Past trials of medication:denies History of violence:denies Had a traumatic exposure:on PEA during admission    Past Medical History:  Past Medical History:  Diagnosis Date  . Asthma    uses albuterol as needed "mostly weather induced".ALbuterol and Ventolin inhaler as needed  . Chronic back pain   . Chronic neck pain   . Complication of anesthesia   . Degenerative disc disease    cervical and lumbar area  . History of bronchitis 2016  .  Insomnia    takes Ambien nightly  . Muscle spasm    takes Robaxin daily   . Pneumonia    hx of-2016  . PONV (postoperative nausea and vomiting)   . Weakness    numbness and tingling in legs    Past Surgical History:  Procedure Laterality Date  . BACK SURGERY     x 3 lumbar  . CHOLECYSTECTOMY    . DILATION AND CURETTAGE OF UTERUS     x 2  . KNEE SURGERY Left    x 2  . POSTERIOR CERVICAL FUSION/FORAMINOTOMY  05/17/2012   Procedure: POSTERIOR CERVICAL FUSION/FORAMINOTOMY LEVEL 2;  Surgeon: Hewitt Shorts, MD;  Location: [facility];  Service: Neurosurgery;  Laterality: N/A;  cervical six to Thoracic One posterior cervical arthrodesis with instrumentation  . SHOULDER SURGERY     right  . SPINE SURGERY     x 5  . TOE FUSION Left 09/2016    Family Psychiatric History: I have reviewed the patient's family history in detail and updated the patient record.  Family History:  Family History  Problem Relation Age of Onset  . Dementia Mother   . Other Sister   . Alcohol abuse Other     Social History:  Social History   Socioeconomic History  . Marital status: Married    Spouse name: None  . Number of children: None  . Years of education: None  . Highest education level: None  Social Needs  . Physicist, medical  strain: None  . Food insecurity - worry: None  . Food insecurity - inability: None  . Transportation needs - medical: None  . Transportation needs - non-medical: None  Occupational History  . None  Tobacco Use  . Smoking status: Never Smoker  . Smokeless tobacco: Never Used  Substance and Sexual Activity  . Alcohol use: No  . Drug use: No  . Sexual activity: Yes    Birth control/protection: Post-menopausal  Other Topics Concern  . None  Social History Narrative  . None    Allergies:  Allergies  Allergen Reactions  . No Known Allergies     Metabolic Disorder Labs: No results found for: HGBA1C, MPG No results found for: PROLACTIN No results  found for: CHOL, TRIG, HDL, CHOLHDL, VLDL, LDLCALC Lab Results  Component Value Date   TSH 4.000 06/26/2017   TSH 5.350 (H) 03/24/2017    Therapeutic Level Labs: No results found for: LITHIUM No results found for: VALPROATE No components found for:  CBMZ  Current Medications: Current Outpatient Medications  Medication Sig Dispense Refill  . albuterol (PROVENTIL HFA;VENTOLIN HFA) 108 (90 Base) MCG/ACT inhaler Inhale 2 puffs into the lungs every 6 (six) hours as needed for wheezing or shortness of breath.    Marland Kitchen albuterol (PROVENTIL) (2.5 MG/3ML) 0.083% nebulizer solution Take 3 mLs (2.5 mg total) by nebulization every 4 (four) hours as needed for wheezing or shortness of breath. 150 mL 0  . celecoxib (CELEBREX) 200 MG capsule Take 1 capsule (200 mg total) by mouth daily. (Patient taking differently: Take 200 mg by mouth daily as needed. ) 30 capsule 0  . DULoxetine HCl 40 MG CPEP Take 40 mg by mouth daily. 90 capsule 0  . gabapentin (NEURONTIN) 300 MG capsule Take 300 mg by mouth 2 (two) times daily.    . methocarbamol (ROBAXIN) 500 MG tablet Take 500 mg by mouth every 6 (six) hours as needed for muscle spasms.     Marland Kitchen zolpidem (AMBIEN) 10 MG tablet TAKE 1 TABLET BY MOUTH AT BEDTIME AS NEEDED FOR SLEEP (Patient taking differently: TAKE 1 TABLET BY MOUTH AT BEDTIME FOR SLEEP) 90 tablet 5  . HYDROcodone-acetaminophen (NORCO/VICODIN) 5-325 MG tablet Take 1 tablet by mouth every 6 (six) hours as needed for moderate pain. (Patient not taking: Reported on 08/18/2017) 30 tablet 0  . traZODone (DESYREL) 50 MG tablet 25-50 mg at night as needed for sleep 30 tablet 1   No current facility-administered medications for this visit.      Musculoskeletal: Strength & Muscle Tone: within normal limits Gait & Station: normal Patient leans: N/A  Psychiatric Specialty Exam: Review of Systems  Psychiatric/Behavioral: Positive for depression. Negative for hallucinations, memory loss, substance abuse and  suicidal ideas. The patient is nervous/anxious and has insomnia.   All other systems reviewed and are negative.   Blood pressure 105/73, pulse 76, height 5\' 6"  (1.676 m), weight 154 lb (69.9 kg), SpO2 99 %.Body mass index is 24.86 kg/m.  General Appearance: Fairly Groomed  Eye Contact:  Good  Speech:  Clear and Coherent  Volume:  Normal  Mood:  "better"  Affect:  Appropriate, Congruent, Tearful and calm  Thought Process:  Coherent and Goal Directed  Orientation:  Full (Time, Place, and Person)  Thought Content: Logical   Suicidal Thoughts:  No  Homicidal Thoughts:  No  Memory:  Immediate;   Good  Judgement:  Good  Insight:  Good  Psychomotor Activity:  Normal  Concentration:  Concentration: Good and Attention  Span: Good  Recall:  Good  Fund of Knowledge: Good  Language: Good  Akathisia:  No  Handed:  Right  AIMS (if indicated): not done  Assets:  Communication Skills Desire for Improvement  ADL's:  Intact  Cognition: WNL  Sleep:  Poor   Screenings: Mini-Mental     [facility] Visit from [facility]/24/2017 in [facility]  Total Score (max 30 points )  30    PHQ2-9     Office Visit from 01/07/2017 in [facility]  PHQ-2 Total Score  6       Assessment and Plan:  Davene CostainJennifer L Wendt is a 62 y.o. year old female with a history of PTSD, multiple lumbar spine surgeries, reporting cognitive complaints after lumbar surgery complicated admission including PEA cardiac arrest, ICU admission, intubation, who presents for follow up appointment for PTSD (post-traumatic stress disorder)  # PTSD There has been gradual improvement in PTSD symptoms since the last appointment. Will uptitrate duloxetine to target residual symptoms of PTSD and pain. Discussed self compassion. Discussed negative appraisal of trauma. Discussed cognitive defusion and value congruent action she can commit to. Provided psycho education of continuing medication at least nine months to one year  to avoid relapse.   # r/o neurocognitive impairment She was evaluated by neurologist in September 2018. MRI with no significant abnormality. It is multi factorial given her active mood symptoms. Will continue to monitor and evaluate with M[facility] as needed.   Plan I have reviewed and updated plans as below 1. Increase duloxetine 40 mg daily  2. Start Trazodone 25- 50 mg at night as needed for insomnia 3. Return to clinic in two months for 30 mins - Patient is on Ambien,prescribed by her PCP - TSH wnl per patient    The patient demonstrates the following risk factors for suicide: Chronic risk factors for suicide include:psychiatric disorder ofPTSDand chronic pain. Acute risk factorsfor suicide include: N/A. Protective factorsfor this patient include: positive social support, responsibility to others (children, family), coping skills and hope for the future. Considering these factors, the overall suicide risk at this point appears to below. Patientisappropriate for outpatient follow up.  The duration of this appointment visit was 30 minutes of face-to-face time with the patient.  Greater than 50% of this time was spent in counseling, explanation of  diagnosis, planning of further management, and coordination of care.  Neysa Hottereina Donnelle Olmeda, MD 08/18/2017, 4:47 PM

## 2017-08-18 ENCOUNTER — Ambulatory Visit (HOSPITAL_COMMUNITY): Payer: BLUE CROSS/BLUE SHIELD | Admitting: Psychiatry

## 2017-08-18 ENCOUNTER — Encounter (HOSPITAL_COMMUNITY): Payer: Self-pay | Admitting: Psychiatry

## 2017-08-18 VITALS — BP 105/73 | HR 76 | Ht 66.0 in | Wt 154.0 lb

## 2017-08-18 DIAGNOSIS — Z9889 Other specified postprocedural states: Secondary | ICD-10-CM

## 2017-08-18 DIAGNOSIS — Z811 Family history of alcohol abuse and dependence: Secondary | ICD-10-CM | POA: Diagnosis not present

## 2017-08-18 DIAGNOSIS — G4700 Insomnia, unspecified: Secondary | ICD-10-CM | POA: Diagnosis not present

## 2017-08-18 DIAGNOSIS — R450 Nervousness: Secondary | ICD-10-CM

## 2017-08-18 DIAGNOSIS — F419 Anxiety disorder, unspecified: Secondary | ICD-10-CM | POA: Diagnosis not present

## 2017-08-18 DIAGNOSIS — F4310 Post-traumatic stress disorder, unspecified: Secondary | ICD-10-CM | POA: Diagnosis not present

## 2017-08-18 DIAGNOSIS — Z810 Family history of intellectual disabilities: Secondary | ICD-10-CM | POA: Diagnosis not present

## 2017-08-18 MED ORDER — TRAZODONE HCL 50 MG PO TABS: ORAL_TABLET | ORAL | 1 refills | Status: DC

## 2017-08-18 MED ORDER — DULOXETINE HCL 40 MG PO CPEP: 40.0000 mg | ORAL_CAPSULE | Freq: Every day | ORAL | 0 refills | Status: DC

## 2017-08-18 NOTE — Patient Instructions (Signed)
1. Increase duloxetine 40 mg daily  2. Start Trazodone 25- 50 mg at night as needed for insomnia 3. Return to clinic in two months for 30 mins

## 2017-10-13 ENCOUNTER — Other Ambulatory Visit (HOSPITAL_COMMUNITY): Payer: Self-pay | Admitting: Psychiatry

## 2017-10-13 MED ORDER — TRAZODONE HCL 50 MG PO TABS: ORAL_TABLET | ORAL | 0 refills | Status: DC

## 2017-10-20 ENCOUNTER — Ambulatory Visit (HOSPITAL_COMMUNITY): Payer: BLUE CROSS/BLUE SHIELD | Admitting: Psychiatry

## 2017-11-12 ENCOUNTER — Telehealth (HOSPITAL_COMMUNITY): Payer: Self-pay | Admitting: Psychiatry

## 2017-11-12 ENCOUNTER — Other Ambulatory Visit (HOSPITAL_COMMUNITY): Payer: Self-pay | Admitting: Psychiatry

## 2017-11-12 MED ORDER — DULOXETINE HCL 40 MG PO CPEP: 40.0000 mg | ORAL_CAPSULE | Freq: Every day | ORAL | 0 refills | Status: DC

## 2017-11-12 NOTE — Telephone Encounter (Signed)
ordered

## 2017-11-12 NOTE — Telephone Encounter (Signed)
Dr Vanetta Shawl Refill is needed & she has made a f/u appointment with Lupita Leash

## 2017-11-12 NOTE — Telephone Encounter (Signed)
Received request from pharmacy for duloxetine. Please contact the patient to make follow up, and also ask if she needs this refill.

## 2017-11-18 NOTE — Progress Notes (Deleted)
BH MD/PA/NP OP Progress Note  11/18/2017 10:09 AM Stephanie Noble  MRN:  [id number]  Chief Complaint:  HPI:   Off effexor  Visit Diagnosis: No diagnosis found.  Past Psychiatric History:  I have reviewed the patient's psychiatry history in detail and updated the patient record. Psychiatry admission:denies Previous suicide attempt: denies Past trials of medication:denies History of violence:denies Had a traumatic exposure:on PEA during admission   Past Medical History:  Past Medical History:  Diagnosis Date  . Asthma    uses albuterol as needed "mostly weather induced".ALbuterol and Ventolin inhaler as needed  . Chronic back pain   . Chronic neck pain   . Complication of anesthesia   . Degenerative disc disease    cervical and lumbar area  . History of bronchitis 2016  . Insomnia    takes Ambien nightly  . Muscle spasm    takes Robaxin daily   . Pneumonia    hx of-2016  . PONV (postoperative nausea and vomiting)   . Weakness    numbness and tingling in legs    Past Surgical History:  Procedure Laterality Date  . BACK SURGERY     x 3 lumbar  . CHOLECYSTECTOMY    . DILATION AND CURETTAGE OF UTERUS     x 2  . KNEE SURGERY Left    x 2  . POSTERIOR CERVICAL FUSION/FORAMINOTOMY  05/17/2012   Procedure: POSTERIOR CERVICAL FUSION/FORAMINOTOMY LEVEL 2;  Surgeon: Hewitt Shorts, MD;  Location: [facility];  Service: Neurosurgery;  Laterality: N/A;  cervical six to Thoracic One posterior cervical arthrodesis with instrumentation  . SHOULDER SURGERY     right  . SPINE SURGERY     x 5  . TOE FUSION Left 09/2016    Family Psychiatric History:  I have reviewed the patient's family history in detail and updated the patient record. Family History:  Family History  Problem Relation Age of Onset  . Dementia Mother   . Other Sister   . Alcohol abuse Other     Social History:  Social History   Socioeconomic History  . Marital status: Married    Spouse  name: Not on file  . Number of children: Not on file  . Years of education: Not on file  . Highest education level: Not on file  Occupational History  . Not on file  Social Needs  . Financial resource strain: Not on file  . Food insecurity:    Worry: Not on file    Inability: Not on file  . Transportation needs:    Medical: Not on file    Non-medical: Not on file  Tobacco Use  . Smoking status: Never Smoker  . Smokeless tobacco: Never Used  Substance and Sexual Activity  . Alcohol use: No  . Drug use: No  . Sexual activity: Yes    Birth control/protection: Post-menopausal  Lifestyle  . Physical activity:    Days per week: Not on file    Minutes per session: Not on file  . Stress: Not on file  Relationships  . Social connections:    Talks on phone: Not on file    Gets together: Not on file    Attends religious service: Not on file    Active member of club or organization: Not on file    Attends meetings of clubs or organizations: Not on file    Relationship status: Not on file  Other Topics Concern  . Not on file  Social History  Narrative  . Not on file    Allergies:  Allergies  Allergen Reactions  . No Known Allergies     Metabolic Disorder Labs: No results found for: HGBA1C, MPG No results found for: PROLACTIN No results found for: CHOL, TRIG, HDL, CHOLHDL, VLDL, LDLCALC Lab Results  Component Value Date   TSH 4.000 06/26/2017   TSH 5.350 (H) 03/24/2017    Therapeutic Level Labs: No results found for: LITHIUM No results found for: VALPROATE No components found for:  CBMZ  Current Medications: Current Outpatient Medications  Medication Sig Dispense Refill  . albuterol (PROVENTIL HFA;VENTOLIN HFA) 108 (90 Base) MCG/ACT inhaler Inhale 2 puffs into the lungs every 6 (six) hours as needed for wheezing or shortness of breath.    Marland Kitchen albuterol (PROVENTIL) (2.5 MG/3ML) 0.083% nebulizer solution Take 3 mLs (2.5 mg total) by nebulization every 4 (four) hours as  needed for wheezing or shortness of breath. 150 mL 0  . celecoxib (CELEBREX) 200 MG capsule Take 1 capsule (200 mg total) by mouth daily. (Patient taking differently: Take 200 mg by mouth daily as needed. ) 30 capsule 0  . DULoxetine HCl 40 MG CPEP Take 40 mg by mouth daily. 90 capsule 0  . gabapentin (NEURONTIN) 300 MG capsule Take 300 mg by mouth 2 (two) times daily.    Marland Kitchen HYDROcodone-acetaminophen (NORCO/VICODIN) 5-325 MG tablet Take 1 tablet by mouth every 6 (six) hours as needed for moderate pain. (Patient not taking: Reported on 08/18/2017) 30 tablet 0  . methocarbamol (ROBAXIN) 500 MG tablet Take 500 mg by mouth every 6 (six) hours as needed for muscle spasms.     . traZODone (DESYREL) 50 MG tablet 25-50 mg at night as needed for sleep 90 tablet 0  . zolpidem (AMBIEN) 10 MG tablet TAKE 1 TABLET BY MOUTH AT BEDTIME AS NEEDED FOR SLEEP (Patient taking differently: TAKE 1 TABLET BY MOUTH AT BEDTIME FOR SLEEP) 90 tablet 5   No current facility-administered medications for this visit.      Musculoskeletal: Strength & Muscle Tone: within normal limits Gait & Station: normal Patient leans: N/A  Psychiatric Specialty Exam: ROS  There were no vitals taken for this visit.There is no height or weight on file to calculate BMI.  General Appearance: Fairly Groomed  Eye Contact:  Good  Speech:  Clear and Coherent  Volume:  Normal  Mood:  {BHH MOOD:22306}  Affect:  {Affect (PAA):22687}  Thought Process:  Coherent  Orientation:  Full (Time, Place, and Person)  Thought Content: Logical   Suicidal Thoughts:  {ST/HT (PAA):22692}  Homicidal Thoughts:  {ST/HT (PAA):22692}  Memory:  Immediate;   Good  Judgement:  Good  Insight:  {Insight (PAA):22695}  Psychomotor Activity:  Normal  Concentration:  Concentration: Good and Attention Span: Good  Recall:  Good  Fund of Knowledge: Good  Language: Good  Akathisia:  No  Handed:  Right  AIMS (if indicated): not done  Assets:  Communication  Skills Desire for Improvement  ADL's:  Intact  Cognition: WNL  Sleep:  {BHH GOOD/FAIR/POOR:22877}   Screenings: Mini-Mental     Office Visit from 03/24/2017 in [facility]  Total Score (max 30 points )  30    PHQ2-9     Office Visit from 01/07/2017 in [facility]  PHQ-2 Total Score  6       Assessment and Plan:  Stephanie Noble is a 62 y.o. year old female with a history of PTSD,multiple lumbar spine surgeries, reporting cognitive complaints  after lumbar surgery complicated admission including PEA cardiac arrest, ICU admission, intubation, , who presents for follow up appointment for No diagnosis found.  # PTSD  There has been gradual improvement in PTSD symptoms since the last appointment. Will uptitrate duloxetine to target residual symptoms of PTSD and pain. Discussed self compassion. Discussed negative appraisal of trauma. Discussed cognitive defusion and value congruent action she can commit to. Provided psycho education of continuing medication at least nine months to one year to avoid relapse.   # r/o neurocognitive impairment  She was evaluated by neurologist in September 2018. MRI with no significant abnormality. It is multi factorial given her active mood symptoms. Will continue to monitor and evaluate with MOCA as needed.   Plan  1. Increase duloxetine 40 mg daily  2. Start Trazodone 25- 50 mg at night as needed for insomnia 3. Return to clinic in two months for 30 mins - Patient is on Ambien,prescribed by her PCP - TSH wnl per patient    The patient demonstrates the following risk factors for suicide: Chronic risk factors for suicide include:psychiatric disorder ofPTSDand chronic pain. Acute risk factorsfor suicide include: N/A. Protective factorsfor this patient include: positive social support, responsibility to others (children, family), coping skills and hope for the future. Considering these factors, the overall  suicide risk at this point appears to below. Patientisappropriate for outpatient follow up.    Neysa Hotter, MD 11/18/2017, 10:09 AM

## 2017-11-20 ENCOUNTER — Ambulatory Visit (HOSPITAL_COMMUNITY): Payer: Self-pay | Admitting: Psychiatry

## 2017-11-26 ENCOUNTER — Other Ambulatory Visit: Payer: Self-pay | Admitting: Family Medicine

## 2017-11-27 NOTE — Telephone Encounter (Signed)
Six mo ok 

## 2017-12-02 ENCOUNTER — Other Ambulatory Visit: Payer: Self-pay | Admitting: Family Medicine

## 2017-12-04 NOTE — Telephone Encounter (Signed)
Six mo ok 

## 2017-12-07 ENCOUNTER — Encounter: Payer: Self-pay | Admitting: Family Medicine

## 2017-12-07 ENCOUNTER — Ambulatory Visit: Payer: BLUE CROSS/BLUE SHIELD | Admitting: Family Medicine

## 2017-12-07 VITALS — BP 102/88 | Temp 98.1°F | Ht 66.0 in | Wt 150.0 lb

## 2017-12-07 DIAGNOSIS — R419 Unspecified symptoms and signs involving cognitive functions and awareness: Secondary | ICD-10-CM | POA: Diagnosis not present

## 2017-12-07 DIAGNOSIS — M79641 Pain in right hand: Secondary | ICD-10-CM

## 2017-12-07 DIAGNOSIS — F5101 Primary insomnia: Secondary | ICD-10-CM | POA: Diagnosis not present

## 2017-12-07 DIAGNOSIS — M79642 Pain in left hand: Secondary | ICD-10-CM

## 2017-12-07 DIAGNOSIS — F4310 Post-traumatic stress disorder, unspecified: Secondary | ICD-10-CM

## 2017-12-07 NOTE — Progress Notes (Addendum)
   Subjective:    Patient ID: Davene CostainJennifer L Townshend, female    DOB: 04/02/1956, 62 y.o.   MRN: [id number]  HPI Patient is here today to follow up on insomnia.She states she is still having problems with sleep  Memory issues ongoing, not going sw well, pt forgets where she placed something and she'll forget, writing down moments but then forgets them    Knowing how to get to [place]  But still very frustrated about memeory  Psych helped, but does not see regular  counselling did not help so stops  Neuro eval, but pt declined full neurocognitive studies    Taking cymbalta gneri which seems to keep things on an even keel  Pt never wants to experience a disoassociative symotm  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 She states she is also here today to go over her labs and to speak with you regarding her bilateral hands pain.  She also is having headaches.   Hand pain worsening over yrs, hurts in odd places in the right hand , trouble with thumb bending to the palm  Patient compliant with insomnia medication. Generally takes most nights. No obvious morning drowsiness. Definitely helps patient sleep. Without it patient states would not get a good nights rest.   Follow up on Ptsd gad and phq given to fill out.   Review of Systems No headache, no major weight loss or weight gain, no chest pain no back pain abdominal pain no change in bowel habits complete ROS otherwise negative     Objective:   Physical Exam  Alert and oriented, vitals reviewed and stable, NAD ENT-TM's and ext canals WNL bilat via otoscopic exam Soft palate, tonsils and post pharynx WNL via oropharyngeal exam Neck-symmetric, no masses; thyroid nonpalpable and nontender Pulmonary-no tachypnea or accessory muscle use; Clear without  wheezes via auscultation Card--no abnrml murmurs, rhythm reg and rate WNL Carotid pulses symmetric, without bruits       Assessment & Plan:  1 impression posttraumatic stress disorder.  Followed by psychiatrist for this.  Do visit back to psychiatrist soon.  Ongoing frustration per patient.  Patient still frustrated by challenges with memory and cognitive function.  She declined a full neurocognitive work-up recommended by the neurologist.  Many questions answered and history  2.  Insomnia.  Substantial.  Discussed maintaining nighttime medication.  3.  Bilateral hand pain.  With both orthopedic and neuropathic elements.  Both hands.  Deep pain in the hand and wrist.  Elements of trigger finger at times.  Also numbness and tingling.  Patient reports she has had "for years" just getting worse.  Patient will need both an orthopedic and neurological evaluation of the hand.  Potentially further intervention.  Discussed at length.  Best place to start is hand specialist and will work on appointment  Greater than 50% of this 40 minute face to face visit was spent in counseling and discussion and coordination of care regarding the above diagnosis/diagnosies  Minette HeadlandWS Luking MD

## 2017-12-09 ENCOUNTER — Ambulatory Visit (HOSPITAL_COMMUNITY)
Admission: RE | Admit: 2017-12-09 | Discharge: 2017-12-09 | Disposition: A | Discharge status: Home / Self Care | Payer: BLUE CROSS/BLUE SHIELD | Source: Ambulatory Visit | Attending: Nurse Practitioner | Admitting: Nurse Practitioner

## 2017-12-09 ENCOUNTER — Other Ambulatory Visit (HOSPITAL_COMMUNITY): Payer: Self-pay | Admitting: Nurse Practitioner

## 2017-12-09 DIAGNOSIS — R0602 Shortness of breath: Secondary | ICD-10-CM | POA: Insufficient documentation

## 2017-12-14 NOTE — Addendum Note (Signed)
Addended by: Merlyn AlbertLUKING, Shaunessy Dobratz S on: 12/14/2017 12:26 PM   Modules accepted: Level of Service

## 2017-12-18 ENCOUNTER — Encounter: Payer: Self-pay | Admitting: Family Medicine

## 2018-01-05 ENCOUNTER — Other Ambulatory Visit (HOSPITAL_COMMUNITY): Payer: Self-pay | Admitting: Psychiatry

## 2018-02-02 ENCOUNTER — Telehealth: Payer: Self-pay | Admitting: Neurology

## 2018-02-02 DIAGNOSIS — R413 Other amnesia: Secondary | ICD-10-CM

## 2018-02-02 DIAGNOSIS — R419 Unspecified symptoms and signs involving cognitive functions and awareness: Secondary | ICD-10-CM

## 2018-02-02 NOTE — Telephone Encounter (Signed)
Per Dr. Lucia GaskinsAhern, will send referral and ask Annabelle HarmanDana to refer to soonest avail doctor.

## 2018-02-02 NOTE — Addendum Note (Signed)
Addended by: Bertram SavinULBERTSON, Patton Swisher L on: 02/02/2018 04:18 PM   Modules accepted: Orders

## 2018-02-02 NOTE — Telephone Encounter (Addendum)
Spoke with Dr. Lucia GaskinsAhern. We can order the formal cognitive testing, will likely be a 6 month wait or so. Will advise pt to call back if needed after she gets results.

## 2018-02-02 NOTE — Telephone Encounter (Signed)
Will discuss best course of action with Dr. Lucia GaskinsAhern. At 02/2017 office visit MD recommended formal memory testing but pt declined and was to let her know if pt changed her mind. Will see if repeat office visit needed or if referral can be sent and then pt to f/u with Dr. Lucia GaskinsAhern afterward. MRI brain was unremarkable and labs concerning for hypothyroidism. Patient was seen by Dr. Gerda DissLuking since then and repeat TSH was normal. Recommended yearly thyroid testing but no need for treatment.

## 2018-02-02 NOTE — Telephone Encounter (Signed)
Spoke with patient and advised her that we will send in the referral for formal neurocognitive testing. Pt appreciative and aware that it could be 6 months before testing will be done. Pt stated they are in the process of seeking legal counsel and cannot afford to wait 6 months to be seen for the testing. RN advised we can send in a request for soonest availability however she would need to speak with them directly. Pt appreciative.

## 2018-02-02 NOTE — Telephone Encounter (Signed)
Pt requesting a call stating she would like to move forward with testing that had been recommend. Pt advised that it has almost been a year since she was seen and would need to come in for a evaluation. Pt stated she would rather discuss with Rn before scheduling anything.

## 2018-02-02 NOTE — Telephone Encounter (Signed)
Called and spoke to patient relayed patient can be seen soon buy Dr. Baird Kaylodfleter [place] . Patient is going to talk to her husband and call me back on Thursday.

## 2018-02-10 NOTE — Telephone Encounter (Signed)
Spoke to patient and her husband she relayed that she did not want to go to [place] and sh just wanted to be sent to Dr. Lucile ShuttersBailiar . I told her Dr. Linward NatalBialiar did not have any openings anytime soon . Patient relayed she will wait .

## 2018-02-10 NOTE — Addendum Note (Signed)
Addended by: Bertram SavinULBERTSON, Shelbia Scinto L on: 02/10/2018 04:10 PM   Modules accepted: Orders

## 2018-02-11 ENCOUNTER — Encounter: Payer: Self-pay | Admitting: Psychology

## 2018-03-01 ENCOUNTER — Other Ambulatory Visit (HOSPITAL_COMMUNITY): Payer: Self-pay | Admitting: Psychiatry

## 2018-03-25 ENCOUNTER — Ambulatory Visit (INDEPENDENT_AMBULATORY_CARE_PROVIDER_SITE_OTHER): Payer: BLUE CROSS/BLUE SHIELD

## 2018-03-25 ENCOUNTER — Ambulatory Visit: Payer: BLUE CROSS/BLUE SHIELD

## 2018-03-25 DIAGNOSIS — Z23 Encounter for immunization: Secondary | ICD-10-CM

## 2018-04-23 ENCOUNTER — Telehealth: Payer: Self-pay | Admitting: Family Medicine

## 2018-04-23 NOTE — Telephone Encounter (Signed)
sure

## 2018-04-23 NOTE — Telephone Encounter (Signed)
FYI   Pt called requesting referral to Dr. Carmina Miller (neuropsychology)  We originally referred to neurology & their neuropsych is leaving town before pt can get eval  Micah Flesher ahead & sent info needed for pt to get in with Dr. Silvano Rusk

## 2018-05-27 ENCOUNTER — Other Ambulatory Visit: Payer: Self-pay | Admitting: Family Medicine

## 2018-05-28 NOTE — Telephone Encounter (Signed)
Six mo worth 

## 2018-06-24 ENCOUNTER — Telehealth: Payer: Self-pay | Admitting: Family Medicine

## 2018-06-24 NOTE — Telephone Encounter (Signed)
Last prescribed 06/18/17 by erin zamora np. She states she takes for back pain. Has had 6 spinal fusions. She states she might take one per month. She last got #30 on 06/18/17. Pt aware dr Brett Canalessteve out of office til tomorrow.

## 2018-06-24 NOTE — Telephone Encounter (Signed)
Patient is requesting refill on hydrocodone 5/325 last seen 12/18/17

## 2018-06-24 NOTE — Telephone Encounter (Signed)
Ok times one 

## 2018-06-25 ENCOUNTER — Other Ambulatory Visit: Payer: Self-pay | Admitting: *Deleted

## 2018-06-25 MED ORDER — HYDROCODONE-ACETAMINOPHEN 5-325 MG PO TABS: 1.0000 | ORAL_TABLET | Freq: Four times a day (QID) | ORAL | 0 refills | Status: DC | PRN

## 2018-06-25 NOTE — Telephone Encounter (Signed)
Pt notified script at front for pick up

## 2018-06-25 NOTE — Telephone Encounter (Signed)
rx printed. Await signature from doctor then will call pt to pick up

## 2018-06-25 NOTE — Telephone Encounter (Signed)
Tried to call no answer. Voicemail full.  

## 2018-08-03 ENCOUNTER — Encounter: Payer: Self-pay | Admitting: Psychology

## 2018-08-25 ENCOUNTER — Ambulatory Visit (INDEPENDENT_AMBULATORY_CARE_PROVIDER_SITE_OTHER): Payer: BLUE CROSS/BLUE SHIELD

## 2018-08-25 ENCOUNTER — Ambulatory Visit (INDEPENDENT_AMBULATORY_CARE_PROVIDER_SITE_OTHER): Payer: BLUE CROSS/BLUE SHIELD | Admitting: Orthopedic Surgery

## 2018-08-25 ENCOUNTER — Encounter (INDEPENDENT_AMBULATORY_CARE_PROVIDER_SITE_OTHER): Payer: Self-pay | Admitting: Orthopedic Surgery

## 2018-08-25 DIAGNOSIS — M25552 Pain in left hip: Secondary | ICD-10-CM

## 2018-08-25 MED ORDER — HYDROCODONE-ACETAMINOPHEN 5-325 MG PO TABS: ORAL_TABLET | ORAL | 0 refills | Status: DC

## 2018-08-26 ENCOUNTER — Encounter (INDEPENDENT_AMBULATORY_CARE_PROVIDER_SITE_OTHER): Payer: Self-pay | Admitting: Physical Medicine and Rehabilitation

## 2018-08-26 ENCOUNTER — Ambulatory Visit (INDEPENDENT_AMBULATORY_CARE_PROVIDER_SITE_OTHER): Payer: BLUE CROSS/BLUE SHIELD

## 2018-08-26 ENCOUNTER — Ambulatory Visit (INDEPENDENT_AMBULATORY_CARE_PROVIDER_SITE_OTHER): Payer: BLUE CROSS/BLUE SHIELD | Admitting: Physical Medicine and Rehabilitation

## 2018-08-26 DIAGNOSIS — M25552 Pain in left hip: Secondary | ICD-10-CM | POA: Diagnosis not present

## 2018-08-26 NOTE — Progress Notes (Signed)
 .  Numeric Pain Rating Scale and Functional Assessment Average Pain 7   In the last MONTH (on 0-10 scale) has pain interfered with the following?  1. General activity like being  able to carry out your everyday physical activities such as walking, climbing stairs, carrying groceries, or moving a chair?  Rating(6)   -Dye Allergies.  

## 2018-08-26 NOTE — Progress Notes (Signed)
Stephanie Noble - 63 y.o. female MRN [id number]  Date of birth: February 28, 1956  Office Visit Note: Visit Date: 08/26/2018 PCP: Merlyn Albert, MD Referred by: Merlyn Albert, MD  Subjective: Chief Complaint  Patient presents with  . Left Hip - Pain  . Left Leg - Pain   HPI: Stephanie Noble is a 63 y.o. female who comes in today At the request of G. Dorene Grebe for diagnostic and hopefully therapeutic anesthetic hip arthrogram on the left.  Patient has had multiple lumbar back surgeries and I am seeing her in the past for back injection.  She is having concordant left hip and groin pain worse with rotation.  This is been ongoing for about 10 weeks and is failed conservative care.  ROS Otherwise per HPI.  Assessment & Plan: Visit Diagnoses:  1. Pain in left hip     Plan: Findings:  Diagnostic and hopefully therapeutic left hip anesthetic arthrogram.  Patient did have significant relief during the anesthetic phase.    Meds & Orders: No orders of the defined types were placed in this encounter.   Orders Placed This Encounter  Procedures  . Large Joint Inj: L hip joint  . XR C-ARM NO REPORT    Follow-up: No follow-ups on file.   Procedures: Large Joint Inj: L hip joint on 08/26/2018 2:37 PM Indications: pain and diagnostic evaluation Details: 22 G needle, anterior approach  Arthrogram: Yes  Medications: 80 mg triamcinolone acetonide 40 MG/ML; 3 mL bupivacaine 0.5 % Outcome: tolerated well, no immediate complications  Arthrogram demonstrated excellent flow of contrast throughout the joint surface without extravasation or obvious defect.  The patient had relief of symptoms during the anesthetic phase of the injection.  Procedure, treatment alternatives, risks and benefits explained, specific risks discussed. Consent was given by the patient. Immediately prior to procedure a time out was called to verify the correct patient, procedure, equipment, support staff and site/side  marked as required. Patient was prepped and draped in the usual sterile fashion.      No notes on file   Clinical History: No specialty comments available.   She reports that she has never smoked. She has never used smokeless tobacco. No results for input(s): HGBA1C, LABURIC in the last 8760 hours.  Objective:  VS:  HT:    WT:   BMI:     BP:   HR: bpm  TEMP: ( )  RESP:  Physical Exam  Ortho Exam Imaging: Xr C-arm No Report  Result Date: 08/26/2018 Please see Notes tab for imaging impression.   Past Medical/Family/Surgical/Social History: Medications & Allergies reviewed per EMR, new medications updated. Patient Active Problem List   Diagnosis Date Noted  . PTSD (post-traumatic stress disorder) 04/13/2017  . Cognitive complaints 03/25/2017  . Hallux rigidus, left foot 09/15/2016  . Hallux rigidus, right foot 09/15/2016  . Pain in both feet 07/24/2016  . HNP (herniated nucleus pulposus), lumbar 04/16/2016  . Chronic neck pain 06/22/2015  . Arthritis 01/04/2013  . Insomnia 01/04/2013   Past Medical History:  Diagnosis Date  . Asthma    uses albuterol as needed "mostly weather induced".ALbuterol and Ventolin inhaler as needed  . Chronic back pain   . Chronic neck pain   . Complication of anesthesia   . Degenerative disc disease    cervical and lumbar area  . History of bronchitis 2016  . Insomnia    takes Ambien nightly  . Muscle spasm    takes Robaxin daily   .  Pneumonia    hx of-2016  . PONV (postoperative nausea and vomiting)   . Weakness    numbness and tingling in legs   Family History  Problem Relation Age of Onset  . Dementia Mother   . Other Sister   . Alcohol abuse Other    Past Surgical History:  Procedure Laterality Date  . BACK SURGERY     x 3 lumbar  . CHOLECYSTECTOMY    . DILATION AND CURETTAGE OF UTERUS     x 2  . KNEE SURGERY Left    x 2  . POSTERIOR CERVICAL FUSION/FORAMINOTOMY  05/17/2012   Procedure: POSTERIOR CERVICAL  FUSION/FORAMINOTOMY LEVEL 2;  Surgeon: Hewitt Shorts, MD;  Location: [facility];  Service: Neurosurgery;  Laterality: N/A;  cervical six to Thoracic One posterior cervical arthrodesis with instrumentation  . SHOULDER SURGERY     right  . SPINE SURGERY     x 5  . TOE FUSION Left 09/2016   Social History   Occupational History  . Not on file  Tobacco Use  . Smoking status: Never Smoker  . Smokeless tobacco: Never Used  Substance and Sexual Activity  . Alcohol use: No  . Drug use: No  . Sexual activity: Yes    Birth control/protection: Post-menopausal

## 2018-08-27 MED ORDER — BUPIVACAINE HCL 0.5 % IJ SOLN
3.0000 mL | INTRAMUSCULAR | Status: AC | PRN
  Administered 2018-08-26: 3 mL via INTRA_ARTICULAR

## 2018-08-27 MED ORDER — TRIAMCINOLONE ACETONIDE 40 MG/ML IJ SUSP
80.0000 mg | INTRAMUSCULAR | Status: AC | PRN
  Administered 2018-08-26: 80 mg via INTRA_ARTICULAR

## 2018-08-27 NOTE — Progress Notes (Signed)
Office Visit Note   Patient: Stephanie Noble           Date of Birth: 25-Dec-1955           MRN: [id number] Visit Date: 08/25/2018 Requested by: Merlyn Albert, MD [address] [place], [place] [address] PCP: Merlyn Albert, MD  Subjective: Chief Complaint  Patient presents with  . Left Hip - Pain    HPI: Stephanie Noble is a patient with left hip pain.  She is had lumbar fusion from L2-L4.  She is generally managing with her back.  She reports groin pain developing over the last several months.  Reports decreased function in the hip and she is "not able to do life".  I reviewed her x-rays from 2018 and it did show some degree of arthritis at that time.  It is becoming more untenable.  Describes 3 months of pain.  Hard for her to put her socks on.  The pain is excruciating at times.              ROS: All systems reviewed are negative as they relate to the chief complaint within the history of present illness.  Patient denies  fevers or chills.   Assessment & Plan: Visit Diagnoses:  1. Pain in left hip     Plan: Impression is left hip pain with severe arthritis on plain radiographs.  This appears to have progressed some over the past 2 years but was present 2 years ago.  She is managing reasonably well with her back but this could also be a possible source of pain.  Plan is Norco prescription x1 and diagnostic and therapeutic left hip injection under fluoroscopy.  I like to get that done so we can see what percentage of her pain is relieved with the injection.  If it is not much and we must reconsider the back as a source of the pain but I think with the amount of arthritis present on plain radiographs he would be reasonable to see if there is at least a day or 2 of good relief before considering hip replacement.  She does have about a 1 cm or less leg length discrepancy left shorter than right.  Follow-Up Instructions: No follow-ups on file.   Orders:  Orders Placed This  Encounter  Procedures  . XR HIP UNILAT W OR W/O PELVIS 2-3 VIEWS LEFT   Meds ordered this encounter  Medications  . HYDROcodone-acetaminophen (NORCO/VICODIN) 5-325 MG tablet    Sig: 1 po q 8 hrs prn apin    Dispense:  30 tablet    Refill:  0      Procedures: No procedures performed   Clinical Data: No additional findings.  Objective: Vital Signs: There were no vitals taken for this visit.  Physical Exam:   Constitutional: Patient appears well-developed HEENT:  Head: Normocephalic Eyes:EOM are normal Neck: Normal range of motion Cardiovascular: Normal rate Pulmonary/chest: Effort normal Neurologic: Patient is alert Skin: Skin is warm Psychiatric: Patient has normal mood and affect    Ortho Exam: Ortho exam demonstrates groin pain with internal X rotation of that left-hand side.  Pedal pulses palpable.  Reflexes symmetric bilateral patella and Achilles.  Hip flexion slightly weaker on the left compared to the right.  Back incision intact with no warmth.  No trochanteric tenderness is noted.  No other masses lymphadenopathy or skin changes noted in that back or hip region.  Specialty Comments:  No specialty comments available.  Imaging:  No results found.   PMFS History: Patient Active Problem List   Diagnosis Date Noted  . PTSD (post-traumatic stress disorder) 04/13/2017  . Cognitive complaints 03/25/2017  . Hallux rigidus, left foot 09/15/2016  . Hallux rigidus, right foot 09/15/2016  . Pain in both feet 07/24/2016  . HNP (herniated nucleus pulposus), lumbar 04/16/2016  . Chronic neck pain 06/22/2015  . Arthritis 01/04/2013  . Insomnia 01/04/2013   Past Medical History:  Diagnosis Date  . Asthma    uses albuterol as needed "mostly weather induced".ALbuterol and Ventolin inhaler as needed  . Chronic back pain   . Chronic neck pain   . Complication of anesthesia   . Degenerative disc disease    cervical and lumbar area  . History of bronchitis 2016  .  Insomnia    takes Ambien nightly  . Muscle spasm    takes Robaxin daily   . Pneumonia    hx of-2016  . PONV (postoperative nausea and vomiting)   . Weakness    numbness and tingling in legs    Family History  Problem Relation Age of Onset  . Dementia Mother   . Other Sister   . Alcohol abuse Other     Past Surgical History:  Procedure Laterality Date  . BACK SURGERY     x 3 lumbar  . CHOLECYSTECTOMY    . DILATION AND CURETTAGE OF UTERUS     x 2  . KNEE SURGERY Left    x 2  . POSTERIOR CERVICAL FUSION/FORAMINOTOMY  05/17/2012   Procedure: POSTERIOR CERVICAL FUSION/FORAMINOTOMY LEVEL 2;  Surgeon: Hewitt Shorts, MD;  Location: [facility];  Service: Neurosurgery;  Laterality: N/A;  cervical six to Thoracic One posterior cervical arthrodesis with instrumentation  . SHOULDER SURGERY     right  . SPINE SURGERY     x 5  . TOE FUSION Left 09/2016   Social History   Occupational History  . Not on file  Tobacco Use  . Smoking status: Never Smoker  . Smokeless tobacco: Never Used  Substance and Sexual Activity  . Alcohol use: No  . Drug use: No  . Sexual activity: Yes    Birth control/protection: Post-menopausal

## 2018-09-10 ENCOUNTER — Ambulatory Visit: Payer: BLUE CROSS/BLUE SHIELD | Admitting: Family Medicine

## 2018-09-14 ENCOUNTER — Telehealth (INDEPENDENT_AMBULATORY_CARE_PROVIDER_SITE_OTHER): Payer: Self-pay | Admitting: Orthopedic Surgery

## 2018-09-14 MED ORDER — HYDROCODONE-ACETAMINOPHEN 5-325 MG PO TABS: ORAL_TABLET | ORAL | 0 refills | Status: DC

## 2018-09-14 NOTE — Telephone Encounter (Signed)
y

## 2018-09-14 NOTE — Telephone Encounter (Signed)
IC advised could pick up at front desk in the morning.  

## 2018-09-14 NOTE — Telephone Encounter (Signed)
Please advise. Thanks.  In December patient had asked for rx for same thing from PCP

## 2018-09-14 NOTE — Telephone Encounter (Signed)
Printed. Waiting for Dr August Saucer to sign

## 2018-09-14 NOTE — Telephone Encounter (Signed)
I looked back..On 02/26 you gave her #30 tablets but she did not get it filled until 03/04 per database.  Still ok to rf? Just wanted to make sure.

## 2018-09-14 NOTE — Telephone Encounter (Signed)
Patient called needing Rx refilled (Hydrocodone)   Patient said she is at home for a month with her grandchildren Due to school closing for a month. The number to contact patient is [phone]

## 2018-09-14 NOTE — Telephone Encounter (Signed)
Ok to rf x 1 pls callt hx

## 2018-09-15 ENCOUNTER — Other Ambulatory Visit: Payer: Self-pay | Admitting: *Deleted

## 2018-09-15 ENCOUNTER — Telehealth: Payer: Self-pay | Admitting: Family Medicine

## 2018-09-15 MED ORDER — DULOXETINE HCL 40 MG PO CPEP: 40.0000 mg | ORAL_CAPSULE | Freq: Every day | ORAL | 1 refills | Status: DC

## 2018-09-15 MED ORDER — CELECOXIB 200 MG PO CAPS: 200.0000 mg | ORAL_CAPSULE | Freq: Every day | ORAL | 5 refills | Status: DC

## 2018-09-15 MED ORDER — GABAPENTIN 300 MG PO CAPS: ORAL_CAPSULE | ORAL | 5 refills | Status: DC

## 2018-09-15 MED ORDER — METHOCARBAMOL 750 MG PO TABS: ORAL_TABLET | ORAL | 5 refills | Status: DC

## 2018-09-15 NOTE — Telephone Encounter (Signed)
Patient requesting refills for   gabapentin (NEURONTIN) 300 MG capsule   DULoxetine HCl 40 MG CPEP  methocarbamol (ROBAXIN) 500 MG tablet   celecoxib (CELEBREX) 200 MG capsule

## 2018-09-15 NOTE — Telephone Encounter (Signed)
Six mo worth 

## 2018-09-15 NOTE — Telephone Encounter (Signed)
meds sent to pharm. Pt notified.  

## 2018-09-15 NOTE — Telephone Encounter (Signed)
Ok six mo all 

## 2018-09-15 NOTE — Telephone Encounter (Signed)
Pt states these meds were prescribed by specialist at [facility] and the directions are different than we have in epic. Want to make sure you are ok with still giving refills. She states she is not going back to specialist due to mobility issues.   She is taking gabapentin 300mg  one qam, one at noon, and 3 qhs.  Duloxetine 40mg  one daily is the same as in epic Robaxin is 750 3 daily And celebrex 200mg  one daily she has not taken since 2018. But wants to start back on because specialist told her she should.   [facility]

## 2018-09-21 ENCOUNTER — Telehealth (INDEPENDENT_AMBULATORY_CARE_PROVIDER_SITE_OTHER): Payer: Self-pay | Admitting: Orthopedic Surgery

## 2018-09-21 NOTE — Telephone Encounter (Signed)
IC s/w patient Advised that all elective surgery cancelled at this time.  However, she states that her mobility is severely decreased. She is spending a lot of time in bed. She wants would warrant her surgery as being "medically necessary" during this time.

## 2018-09-21 NOTE — Telephone Encounter (Signed)
Patient called needing questions answered before she can schedule surgery. Patient asked when can she be scheduled for surgery? Patient said she had the injection in February and want to know if she need to wait awhile longer. Patient said the pain and mobility is worse. Patient asked if when she have the surgery can she have it done as outpatient. The number to contact patient is [phone]

## 2018-09-22 NOTE — Telephone Encounter (Signed)
Currently there is a panel deciding about elective versus nonelective surgery and this is going to come in as elective.  If she were to fall and break her hip it would be nonelective.  But this particular problem with arthritis even with diminished mobility it is not going to get past.  It is medically necessary but it is not emergent and that is the distinction.

## 2018-09-22 NOTE — Telephone Encounter (Signed)
I advised patient and she is aware. Will call with other concerns.

## 2018-09-27 ENCOUNTER — Telehealth (INDEPENDENT_AMBULATORY_CARE_PROVIDER_SITE_OTHER): Payer: Self-pay | Admitting: Orthopedic Surgery

## 2018-09-27 NOTE — Telephone Encounter (Signed)
Dr August Saucer talked with Dr Lequita Halt who is on the committee for surgical case review and he  informed Dr August Saucer that at this time, would not be able to schedule THA until after cleared to resume scheduling elective surgery.  I tried calling patient to advise, busy signal, will try to call again later.

## 2018-09-27 NOTE — Telephone Encounter (Signed)
Please advise. I have talked with her a few times in regards to surgery and advised about elective/nonelective restrictions we are under at this time. Patients husband asking to talk with you.

## 2018-09-27 NOTE — Telephone Encounter (Signed)
I just talked at length with Stephanie Noble and her husband.  She is essentially having incapacitating pain in that left hip.  No fevers or chills.  She is more or less bedridden.  She is having continued pain in the groin and down the thigh but no radicular symptoms below the knee.  She has had a lot of issues in terms of having had prior back surgery and she does well with that and generally does very well with pain but this is been more really than she can handle.  She is a very stoic person from my repeated encounters with her in the clinic previously.  Even though this appears to be an elective type surgery I think based on the intractable pain she is having with this hip that it would be reasonable to consider doing this on an emergent basis.  Again I told her husband that it is really not up to me but that I can only present the case to the committee for review and if they decide that its elective then we will not proceed until the elective schedule resumes.  However if it is deemed emergent and we can go ahead and get it fixed and I think that would makeTor a better quality of life for her in the meantime.

## 2018-09-27 NOTE — Telephone Encounter (Signed)
Patient's husband called wanting to speak to you or Dr. August Saucer in reference to the patient's hip replacement.  CB#[phone].  Thank you.

## 2018-09-28 NOTE — Telephone Encounter (Signed)
IC s/w patients husband in length about surgery not being approved until elective cases may resume being scheduled.  He stated that he was on the verge of taking her to the ER to get something done about this.  I tried to explain to him that even if this was done it would not change anything that the surgeries being scheduled are acute only.

## 2018-10-29 ENCOUNTER — Telehealth: Payer: Self-pay | Admitting: Orthopedic Surgery

## 2018-10-29 MED ORDER — HYDROCODONE-ACETAMINOPHEN 5-325 MG PO TABS: ORAL_TABLET | ORAL | 0 refills | Status: DC

## 2018-10-29 NOTE — Telephone Encounter (Signed)
IC advised could pick up at front desk.  

## 2018-10-29 NOTE — Telephone Encounter (Signed)
Pt called asking if she can have a refill on her pain medication.

## 2018-11-04 ENCOUNTER — Other Ambulatory Visit: Payer: Self-pay

## 2018-11-04 NOTE — Patient Instructions (Signed)
MIGUEL MEDAL  11/04/2018   Your procedure is scheduled on: 11/09/2018   Report to [facility] Main  Entrance  Report to Short Stay  at    0530 AM    Call this number if you have problems the morning of surgery [phone]   Remember: Do not eat food  :After Midnight. BRUSH YOUR TEETH MORNING OF SURGERY AND RINSE YOUR MOUTH OUT, NO CHEWING GUM CANDY OR MINTS.  NO SOLID FOOD AFTER MIDNIGHT THE NIGHT PRIOR TO SURGERY. NOTHING BY MOUTH EXCEPT CLEAR LIQUIDS UNTIL 3 HOURS PRIOR TO SCHEULED SURGERY. PLEASE FINISH ENSURE DRINK PER SURGEON ORDER 3 HOURS PRIOR TO SCHEDULED SURGERY TIME WHICH NEEDS TO BE COMPLETED AT ___0430am _________.    CLEAR LIQUID DIET   Foods Allowed                                                                     Foods Excluded  Coffee and tea, regular and decaf                             liquids that you cannot  Plain Jell-O in any flavor                                             see through such as: Fruit ices (not with fruit pulp)                                     milk, soups, orange juice  Iced Popsicles                                    All solid food Carbonated beverages, regular and diet                                    Cranberry, grape and apple juices Sports drinks like Gatorade Lightly seasoned clear broth or consume(fat free) Sugar, honey syrup  Sample Menu Breakfast                                Lunch                                     Supper Cranberry juice                    Beef broth                            Chicken broth Jell-O  Grape juice                           Apple juice Coffee or tea                        Jell-O                                      Popsicle                                                Coffee or tea                        Coffee or tea  _____________________________________________________________________    Take these medicines the morning of  surgery with A SIP OF WATER: Nebulizer if needed, Inhalers if needed and bring, Cymbalta,Neurontin                                 You may not have any metal on your body including hair pins and              piercings  Do not wear jewelry, make-up, lotions, powders or perfumes, deodorant             Do not wear nail polish.  Do not shave  48 hours prior to surgery.                Do not bring valuables to the hospital. [facility] IS NOT             RESPONSIBLE   FOR VALUABLES.  Contacts, dentures or bridgework may not be worn into surgery.  Leave suitcase in the car. After surgery it may be brought to your room.                  Please read over the following fact sheets you were given: _____________________________________________________________________  [facility] Pre[facility] Before surgery, you can play an important role.  Because skin is not sterile, your skin needs to be as free of germs as possible.  You can reduce the number of germs on your skin by washing with CHG (chlorahexidine gluconate) soap before surgery.  CHG is an antiseptic cleaner which kills germs and bonds with the skin to continue killing germs even after washing. Please DO NOT use if you have an allergy to CHG or antibacterial soaps.  If your skin becomes reddened/irritated stop using the CHG and inform your nurse when you arrive at Short Stay. Do not shave (including legs and underarms) for at least 48 hours prior to the first CHG shower.  You may shave your face/neck. Please follow these instructions carefully:  1.  Shower with CHG Soap the night before surgery and the  morning of Surgery.  2.  If you choose to wash your hair, wash your hair first as usual with your  normal  shampoo.  3.  After you shampoo, rinse your hair and body thoroughly to remove the  shampoo.  4.  Use CHG as you would any other liquid soap.  You can apply chg directly  to the skin and wash                        Gently with a scrungie or clean washcloth.  5.  Apply the CHG Soap to your body ONLY FROM THE NECK DOWN.   Do not use on face/ open                           Wound or open sores. Avoid contact with eyes, ears mouth and genitals (private parts).                       Wash face,  Genitals (private parts) with your normal soap.             6.  Wash thoroughly, paying special attention to the area where your surgery  will be performed.  7.  Thoroughly rinse your body with warm water from the neck down.  8.  DO NOT shower/wash with your normal soap after using and rinsing off  the CHG Soap.                9.  Pat yourself dry with a clean towel.            10.  Wear clean pajamas.            11.  Place clean sheets on your bed the night of your first shower and do not  sleep with pets. Day of Surgery : Do not apply any lotions/deodorants the morning of surgery.  Please wear clean clothes to the hospital/surgery center.  FAILURE TO FOLLOW THESE INSTRUCTIONS MAY RESULT IN THE CANCELLATION OF YOUR SURGERY PATIENT SIGNATURE_________________________________  NURSE SIGNATURE__________________________________  ________________________________________________________________________   Adam Phenix  An incentive spirometer is a tool that can help keep your lungs clear and active. This tool measures how well you are filling your lungs with each breath. Taking long deep breaths may help reverse or decrease the chance of developing breathing (pulmonary) problems (especially infection) following:  A long period of time when you are unable to move or be active. BEFORE THE PROCEDURE   If the spirometer includes an indicator to show your best effort, your nurse or respiratory therapist will set it to a desired goal.  If possible, sit up straight or lean slightly forward. Try not to slouch.  Hold the incentive spirometer in an upright position. INSTRUCTIONS FOR USE  1. Sit on the edge of  your bed if possible, or sit up as far as you can in bed or on a chair. 2. Hold the incentive spirometer in an upright position. 3. Breathe out normally. 4. Place the mouthpiece in your mouth and seal your lips tightly around it. 5. Breathe in slowly and as deeply as possible, raising the piston or the ball toward the top of the column. 6. Hold your breath for 3-5 seconds or for as long as possible. Allow the piston or ball to fall to the bottom of the column. 7. Remove the mouthpiece from your mouth and breathe out normally. 8. Rest for a few seconds and repeat Steps 1 through 7 at least 10 times every 1-2 hours when you are awake. Take your time and take a few normal breaths between deep breaths. 9. The spirometer may include an indicator to  show your best effort. Use the indicator as a goal to work toward during each repetition. 10. After each set of 10 deep breaths, practice coughing to be sure your lungs are clear. If you have an incision (the cut made at the time of surgery), support your incision when coughing by placing a pillow or rolled up towels firmly against it. Once you are able to get out of bed, walk around indoors and cough well. You may stop using the incentive spirometer when instructed by your caregiver.  RISKS AND COMPLICATIONS  Take your time so you do not get dizzy or light-headed.  If you are in pain, you may need to take or ask for pain medication before doing incentive spirometry. It is harder to take a deep breath if you are having pain. AFTER USE  Rest and breathe slowly and easily.  It can be helpful to keep track of a log of your progress. Your caregiver can provide you with a simple table to help with this. If you are using the spirometer at home, follow these instructions: SEEK MEDICAL CARE IF:   You are having difficultly using the spirometer.  You have trouble using the spirometer as often as instructed.  Your pain medication is not giving enough relief  while using the spirometer.  You develop fever of 100.5 F (38.1 C) or higher. SEEK IMMEDIATE MEDICAL CARE IF:   You cough up bloody sputum that had not been present before.  You develop fever of 102 F (38.9 C) or greater.  You develop worsening pain at or near the incision site. MAKE SURE YOU:   Understand these instructions.  Will watch your condition.  Will get help right away if you are not doing well or get worse. Document Released: 10/27/2006 Document Revised: 09/08/2011 Document Reviewed: 12/28/2006 [facility] Patient Information 2014 [place], [place].   ________________________________________________________________________

## 2018-11-05 ENCOUNTER — Other Ambulatory Visit (HOSPITAL_COMMUNITY)
Admission: RE | Admit: 2018-11-05 | Discharge: 2018-11-05 | Disposition: A | Discharge status: Home / Self Care | Payer: BLUE CROSS/BLUE SHIELD | Source: Ambulatory Visit | Attending: Orthopedic Surgery | Admitting: Orthopedic Surgery

## 2018-11-05 ENCOUNTER — Encounter (HOSPITAL_COMMUNITY): Payer: Self-pay

## 2018-11-05 ENCOUNTER — Other Ambulatory Visit: Payer: Self-pay

## 2018-11-05 ENCOUNTER — Encounter (HOSPITAL_COMMUNITY)
Admission: RE | Admit: 2018-11-05 | Discharge: 2018-11-05 | Disposition: A | Discharge status: Home / Self Care | Payer: BLUE CROSS/BLUE SHIELD | Source: Ambulatory Visit | Attending: Orthopedic Surgery | Admitting: Orthopedic Surgery

## 2018-11-05 DIAGNOSIS — M1612 Unilateral primary osteoarthritis, left hip: Secondary | ICD-10-CM | POA: Diagnosis not present

## 2018-11-05 DIAGNOSIS — Z01818 Encounter for other preprocedural examination: Secondary | ICD-10-CM | POA: Insufficient documentation

## 2018-11-05 DIAGNOSIS — Z1159 Encounter for screening for other viral diseases: Secondary | ICD-10-CM | POA: Diagnosis not present

## 2018-11-05 HISTORY — DX: Major depressive disorder, single episode, unspecified: F32.9

## 2018-11-05 HISTORY — DX: Depression, unspecified: F32.A

## 2018-11-05 HISTORY — DX: Anxiety disorder, unspecified: F41.9

## 2018-11-05 LAB — CBC
HCT: 42 % (ref 36.0–46.0)
Hemoglobin: 13.9 g/dL (ref 12.0–15.0)
MCH: 30.5 pg (ref 26.0–34.0)
MCHC: 33.1 g/dL (ref 30.0–36.0)
MCV: 92.1 fL (ref 80.0–100.0)
Platelets: 253 10*3/uL (ref 150–400)
RBC: 4.56 MIL/uL (ref 3.87–5.11)
RDW: 12.7 % (ref 11.5–15.5)
WBC: 5.2 10*3/uL (ref 4.0–10.5)
nRBC: 0 % (ref 0.0–0.2)

## 2018-11-05 LAB — URINALYSIS, ROUTINE W REFLEX MICROSCOPIC
Bacteria, UA: NONE SEEN
Bilirubin Urine: NEGATIVE
Glucose, UA: NEGATIVE mg/dL
Ketones, ur: NEGATIVE mg/dL
Leukocytes,Ua: NEGATIVE
Nitrite: NEGATIVE
Protein, ur: NEGATIVE mg/dL
Specific Gravity, Urine: 1.008 (ref 1.005–1.030)
pH: 7 (ref 5.0–8.0)

## 2018-11-05 LAB — BASIC METABOLIC PANEL
Anion gap: 9 (ref 5–15)
BUN: 13 mg/dL (ref 8–23)
CO2: 29 mmol/L (ref 22–32)
Calcium: 9.4 mg/dL (ref 8.9–10.3)
Chloride: 102 mmol/L (ref 98–111)
Creatinine, Ser: 0.64 mg/dL (ref 0.44–1.00)
GFR calc Af Amer: >60 mL/min (ref >60)
GFR calc non Af Amer: >60 mL/min (ref >60)
Glucose, Bld: 81 mg/dL (ref 70–99)
Potassium: 3.9 mmol/L (ref 3.5–5.1)
Sodium: 140 mmol/L (ref 135–145)

## 2018-11-05 LAB — SURGICAL PCR SCREEN
MRSA, PCR: NEGATIVE
Staphylococcus aureus: NEGATIVE

## 2018-11-05 NOTE — Progress Notes (Signed)
Jodell Cipro PA made aware the fact that patient staes she coded in Nov 01, 2015 after having back surgery at [facility] with DR Newell Coral realted to hypotension and had issues with itching.  Patient staes Dr August Saucer aware. Jodell Cipro PA to review.

## 2018-11-05 NOTE — Progress Notes (Signed)
Case:  [id number] Date/Time:  11/09/18 0715   Procedure:  LEFT TOTAL HIP ARTHROPLASTY DIRECT ANTERIOR APPROACH (Left )   Anesthesia type:  General   Pre-op diagnosis:  left hip osteoarthritis   Location:  [facility] ROOM 08 / [facility]   Surgeon:  Cammy Copaean, Gregory Scott, MD      DISCUSSION:63 yo never smoker with h/o PONV, asthma, depression, anxiety, left hip OA scheduled for above procedure 11/09/18 with Dr. Cammy CopaGregory Scott Dean.   Negative COVID19 swab 11/05/2018.    Pt can proceed with planned procedure barring acute status change.  VS: BP 132/76   Pulse 65   Temp 36.8 C (Oral)   Resp 16   Ht 5\' 6"  (1.676 m)   Wt 65.8 kg   SpO2 100%   BMI 23.40 kg/m   PROVIDERS: Merlyn AlbertLuking, William S, MD is PCP    LABS: Labs reviewed: Acceptable for surgery. (all labs ordered are listed, but only abnormal results are displayed)  Labs Reviewed  URINALYSIS, ROUTINE W REFLEX MICROSCOPIC - Abnormal; Notable for the following components:      Result Value   Hgb urine dipstick SMALL (*)    All other components within normal limits  URINE CULTURE  SURGICAL PCR SCREEN  BASIC METABOLIC PANEL  CBC  TYPE AND SCREEN  ABO/RH     IMAGES:   EKG: 11/05/2018 Rate 68 bpm Normal sinus rhythm   CV: Echo 04/19/2016 Study Conclusions  - Left ventricle: The cavity size was normal. Wall thickness was   normal. Systolic function was normal. The estimated ejection   fraction was in the range of 60% to 65%. Wall motion was normal;   there were no regional wall motion abnormalities. Left   ventricular diastolic function parameters were normal. - Aortic valve: Valve area (VTI): 3.78 cm^2. Valve area (Vmax):   4.28 cm^2. - Atrial septum: No defect or patent foramen ovale was identified. - Pulmonary arteries: Systolic pressure was mildly increased. PA   peak pressure: 36 mm Hg (S). - Technically adequate study. Past Medical History:  Diagnosis Date  . Anxiety   . Asthma    uses albuterol as needed "mostly  weather induced".ALbuterol and Ventolin inhaler as needed  . Chronic back pain   . Chronic neck pain   . Complication of anesthesia   . Degenerative disc disease    cervical and lumbar area  . Depression   . History of bronchitis 2016  . Insomnia    takes Ambien nightly  . Muscle spasm    takes Robaxin daily   . Pneumonia    hx of-2016  . PONV (postoperative nausea and vomiting)   . Weakness    numbness and tingling in legs    Past Surgical History:  Procedure Laterality Date  . BACK SURGERY     x 3 lumbar  . CHOLECYSTECTOMY    . DILATION AND CURETTAGE OF UTERUS     x 2  . KNEE SURGERY Left    x 2  . left knee arthroscopy      x 2  . POSTERIOR CERVICAL FUSION/FORAMINOTOMY  05/17/2012   Procedure: POSTERIOR CERVICAL FUSION/FORAMINOTOMY LEVEL 2;  Surgeon: Hewitt Shortsobert W Nudelman, MD;  Location: [facility];  Service: Neurosurgery;  Laterality: N/A;  cervical six to Thoracic One posterior cervical arthrodesis with instrumentation  . SHOULDER SURGERY     right  . SPINE SURGERY     x 5  . TOE FUSION Left 09/2016    MEDICATIONS: . albuterol (  PROVENTIL HFA;VENTOLIN HFA) 108 (90 Base) MCG/ACT inhaler  . albuterol (PROVENTIL) (2.5 MG/3ML) 0.083% nebulizer solution  . celecoxib (CELEBREX) 200 MG capsule  . DULoxetine (CYMBALTA) 20 MG capsule  . gabapentin (NEURONTIN) 300 MG capsule  . HYDROcodone-acetaminophen (NORCO/VICODIN) 5-325 MG tablet  . methocarbamol (ROBAXIN) 750 MG tablet  . traZODone (DESYREL) 50 MG tablet  . zolpidem (AMBIEN) 10 MG tablet   No current facility-administered medications for this encounter.     Janey Genta [facility] Pre[facility]Surgical Testing [phone] 11/08/18 1:56 PM

## 2018-11-05 NOTE — Progress Notes (Signed)
SPOKE W/  _Patient     SCREENING SYMPTOMS OF COVID 19:   COUGH--no  RUNNY NOSE--- no  SORE THROAT---no  NASAL CONGESTION----no  SNEEZING----no  SHORTNESS OF BREATH---no  DIFFICULTY BREATHING---no  TEMP >100.0 -----no  UNEXPLAINED BODY ACHES------no  CHILLS -------- no  HEADACHES ---------no  LOSS OF SMELL/ TASTE --------no    HAVE YOU OR ANY FAMILY MEMBER TRAVELLED PAST 14 DAYS OUT OF THE   COUNTY---no STATE----no COUNTRY----no  HAVE YOU OR ANY FAMILY MEMBER BEEN EXPOSED TO ANYONE WITH COVID 19? no    

## 2018-11-05 NOTE — Progress Notes (Signed)
Patient instructed on COVID 19 testing with map and reviewed post testing instructions.

## 2018-11-06 LAB — URINE CULTURE: Culture: NO GROWTH

## 2018-11-06 LAB — ABO/RH: ABO/RH(D): O POS

## 2018-11-07 LAB — NOVEL CORONAVIRUS, NAA (HOSP ORDER, SEND-OUT TO REF LAB; TAT 18-24 HRS): SARS-CoV-2, NAA: NOT DETECTED

## 2018-11-08 ENCOUNTER — Other Ambulatory Visit: Payer: Self-pay

## 2018-11-08 ENCOUNTER — Telehealth: Payer: Self-pay | Admitting: Orthopedic Surgery

## 2018-11-08 NOTE — H&P (Signed)
TOTAL HIP ADMISSION H&P  Patient is admitted for left total hip arthroplasty.  Subjective:  Chief Complaint: left hip pain  HPI: Stephanie Noble, 63 y.o. female, has a history of pain and functional disability in the left hip(s) due to arthritis and patient has failed non-surgical conservative treatments for greater than 12 weeks to include NSAID's and/or analgesics, corticosteriod injections, use of assistive devices and activity modification.  Onset of symptoms was gradual starting 2 years ago with rapidlly worsening course since that time.The patient noted no past surgery on the left hip(s).  Patient currently rates pain in the left hip at 9 out of 10 with activity. Patient has night pain, worsening of pain with activity and weight bearing, trendelenberg gait, pain that interfers with activities of daily living, pain with passive range of motion, crepitus and joint swelling. Patient has evidence of subchondral sclerosis and joint space narrowing by imaging studies. This condition presents safety issues increasing the risk of falls. This patient has had Lumbar spine fusion which does put her at an increased risk of perioperative complications particularly dislocation.  Flexion extension views of the lumbar spine demonstrate good restoration of lordosis but she does have pelvic extension with axial spine flexion.  We will have to accommodate her history of lumbar spine surgery in acetabular cup placement..  There is no current active infection.  Patient Active Problem List   Diagnosis Date Noted  . PTSD (post-traumatic stress disorder) 04/13/2017  . Cognitive complaints 03/25/2017  . Hallux rigidus, left foot 09/15/2016  . Hallux rigidus, right foot 09/15/2016  . Pain in both feet 07/24/2016  . HNP (herniated nucleus pulposus), lumbar 04/16/2016  . Chronic neck pain 06/22/2015  . Arthritis 01/04/2013  . Insomnia 01/04/2013   Past Medical History:  Diagnosis Date  . Anxiety   . Asthma     uses albuterol as needed "mostly weather induced".ALbuterol and Ventolin inhaler as needed  . Chronic back pain   . Chronic neck pain   . Complication of anesthesia   . Degenerative disc disease    cervical and lumbar area  . Depression   . History of bronchitis 2016  . Insomnia    takes Ambien nightly  . Muscle spasm    takes Robaxin daily   . Pneumonia    hx of-2016  . PONV (postoperative nausea and vomiting)   . Weakness    numbness and tingling in legs    Past Surgical History:  Procedure Laterality Date  . BACK SURGERY     x 3 lumbar  . CHOLECYSTECTOMY    . DILATION AND CURETTAGE OF UTERUS     x 2  . KNEE SURGERY Left    x 2  . left knee arthroscopy      x 2  . POSTERIOR CERVICAL FUSION/FORAMINOTOMY  05/17/2012   Procedure: POSTERIOR CERVICAL FUSION/FORAMINOTOMY LEVEL 2;  Surgeon: Hewitt Shorts, MD;  Location: [facility];  Service: Neurosurgery;  Laterality: N/A;  cervical six to Thoracic One posterior cervical arthrodesis with instrumentation  . SHOULDER SURGERY     right  . SPINE SURGERY     x 5  . TOE FUSION Left 09/2016    No current facility-administered medications for this encounter.    Current Outpatient Medications  Medication Sig Dispense Refill Last Dose  . albuterol (PROVENTIL HFA;VENTOLIN HFA) 108 (90 Base) MCG/ACT inhaler Inhale 2 puffs into the lungs every 6 (six) hours as needed for wheezing or shortness of breath.   Taking  .  albuterol (PROVENTIL) (2.5 MG/3ML) 0.083% nebulizer solution Take 3 mLs (2.5 mg total) by nebulization every 4 (four) hours as needed for wheezing or shortness of breath. 150 mL 0 Taking  . celecoxib (CELEBREX) 200 MG capsule Take 1 capsule (200 mg total) by mouth daily. 30 capsule 5   . DULoxetine (CYMBALTA) 20 MG capsule Take 20 mg by mouth daily.     Marland Kitchen. gabapentin (NEURONTIN) 300 MG capsule Take one qam, one every noon, and 3 qhs (Patient taking differently: Take 300-900 mg by mouth See admin instructions. Take 300 mg in  the morning , 600 mg at lunch and 900 mg at bedtime) 150 capsule 5   . HYDROcodone-acetaminophen (NORCO/VICODIN) 5-325 MG tablet 1 po BID prn pain (Patient taking differently: Take 0.5 tablets by mouth at bedtime as needed (pain). ) 40 tablet 0   . methocarbamol (ROBAXIN) 750 MG tablet Take one tid prn (Patient taking differently: Take 750 mg by mouth 3 (three) times daily. ) 90 tablet 5   . zolpidem (AMBIEN) 10 MG tablet TAKE 1 TABLET BY MOUTH AT BEDTIME AS NEEDED (Patient taking differently: Take 10 mg by mouth at bedtime. ) 90 tablet 1 Taking  . traZODone (DESYREL) 50 MG tablet 25-50 mg at night as needed for sleep 90 tablet 0 Taking   Allergies  Allergen Reactions  . Vistaril [Hydroxyzine Hcl] Other (See Comments)    May have caused cardiac arrest     Social History   Tobacco Use  . Smoking status: Never Smoker  . Smokeless tobacco: Never Used  Substance Use Topics  . Alcohol use: No    Family History  Problem Relation Age of Onset  . Dementia Mother   . Other Sister   . Alcohol abuse Other      Review of Systems  Musculoskeletal: Positive for joint pain.  All other systems reviewed and are negative.   Objective:  Physical Exam  Constitutional: She appears well-developed.  HENT:  Head: Normocephalic.  Eyes: Pupils are equal, round, and reactive to light.  Neck: No tracheal deviation present.  Cardiovascular: Normal rate.  Respiratory: Effort normal.  Neurological: She is alert.  Skin: Skin is warm.  Psychiatric: She has a normal mood and affect.  Evaluation of the left hip demonstrates equal leg lengths approximately.  She has good ankle dorsiflexion plantarflexion strength.  Does have pretty significant pain with internal X rotation of that left hip.  Pedal pulses palpable.  Vital signs in last 24 hours:    Labs:   Estimated body mass index is 23.4 kg/m as calculated from the following:   Height as of 11/05/18: 5\' 6"  (1.676 m).   Weight as of 11/05/18: 65.8  kg.   Imaging Review Plain radiographs demonstrate severe degenerative joint disease of the left hip(s). The bone quality appears to be good for age and reported activity level.      Assessment/Plan:  End stage arthritis, left hip(s)  The patient history, physical examination, clinical judgement of the provider and imaging studies are consistent with end stage degenerative joint disease of the left hip(s) and total hip arthroplasty is deemed medically necessary. The treatment options including medical management, injection therapy, arthroscopy and arthroplasty were discussed at length. The risks and benefits of total hip arthroplasty were presented and reviewed. The risks due to aseptic loosening, infection, stiffness, dislocation/subluxation,  thromboembolic complications and other imponderables were discussed.  The patient acknowledged the explanation, agreed to proceed with the plan and consent was signed. Patient is  being admitted for inpatient treatment for surgery, pain control, PT, OT, prophylactic antibiotics, VTE prophylaxis, progressive ambulation and ADL's and discharge planning.The patient is planning to be discharged home with home health services patient understands that she is at slightly higher risk of perioperative complications due to her prior lumbar spine surgery.  All questions answered.   Anticipated LOS equal to or greater than 2 midnights due to - Age 59 and older with one or more of the following:  - Obesity  - Expected need for hospital services (PT, OT, Nursing) required for safe  discharge  - Anticipated need for postoperative skilled nursing care or inpatient rehab  - Active co-morbidities: None OR   - Unanticipated findings during/Post Surgery: None  - Patient is a high risk of re-admission due to: Non-elective hospital admission within previous 6 months

## 2018-11-08 NOTE — Anesthesia Preprocedure Evaluation (Deleted)
Anesthesia Evaluation  Patient identified by MRN, date of birth, ID band Patient awake    Reviewed: Allergy & Precautions, H&P , NPO status , Patient's Chart, lab work & pertinent test results, reviewed documented beta blocker date and time   History of Anesthesia Complications (+) PONV and history of anesthetic complications  Airway Mallampati: II  TM Distance: >3 FB Neck ROM: full    Dental no notable dental hx.    Pulmonary asthma ,    Pulmonary exam normal breath sounds clear to auscultation       Cardiovascular Exercise Tolerance: Good negative cardio ROS   Rhythm:regular Rate:Normal  EKG: 11/05/2018 Rate 68 bpm Normal sinus rhythm   CV: Echo 04/19/2016 Study Conclusions  - Left ventricle: The cavity size was normal. Wall thickness was normal. Systolic function was normal. The estimated ejection fraction was in the range of 60% to 65%. Wall motion was normal; there were no regional wall motion abnormalities. Left ventricular diastolic function parameters were normal. - Aortic valve: Valve area (VTI): 3.78 cm^2. Valve area (Vmax): 4.28 cm^2. - Atrial septum: No defect or patent foramen ovale was identified. - Pulmonary arteries: Systolic pressure was mildly increased. PA peak pressure: 36 mm Hg (S). - Technically adequate study.   Neuro/Psych negative neurological ROS  negative psych ROS   GI/Hepatic negative GI ROS, Neg liver ROS,   Endo/Other  negative endocrine ROS  Renal/GU negative Renal ROS  negative genitourinary   Musculoskeletal  (+) Arthritis , Osteoarthritis,    Abdominal   Peds  Hematology negative hematology ROS (+)   Anesthesia Other Findings   Reproductive/Obstetrics negative OB ROS                                                              Anesthesia Evaluation  Patient identified by MRN, date of birth, ID band Patient  awake    Reviewed: Allergy & Precautions, H&P , NPO status , Patient's Chart, lab work & pertinent test results  History of Anesthesia Complications (+) PONVNegative for: history of anesthetic complications  Airway Mallampati: II  TM Distance: >3 FB Neck ROM: full    Dental no notable dental hx.    Pulmonary asthma ,    Pulmonary exam normal breath sounds clear to auscultation       Cardiovascular negative cardio ROS Normal cardiovascular exam Rhythm:regular Rate:Normal     Neuro/Psych negative neurological ROS     GI/Hepatic negative GI ROS, Neg liver ROS,   Endo/Other  negative endocrine ROS  Renal/GU negative Renal ROS     Musculoskeletal  (+) Arthritis ,   Abdominal   Peds  Hematology negative hematology ROS (+)   Anesthesia Other Findings chronic back and neck pain  Reproductive/Obstetrics negative OB ROS                             Anesthesia Physical Anesthesia Plan  ASA: III  Anesthesia Plan: General   Post-op Pain Management:    Induction: Intravenous  Airway Management Planned: Oral ETT and Video Laryngoscope Planned  Additional Equipment:   Intra-op Plan:   Post-operative Plan: Extubation in OR  Informed Consent: I have reviewed the patients History and Physical, chart, labs and discussed the procedure including the risks,  benefits and alternatives for the proposed anesthesia with the patient or authorized representative who has indicated his/her understanding and acceptance.   Dental Advisory Given  Plan Discussed with: Anesthesiologist, CRNA and Surgeon  Anesthesia Plan Comments:         Anesthesia Quick Evaluation  Anesthesia Physical Anesthesia Plan  ASA: II  Anesthesia Plan: General   Post-op Pain Management:    Induction:   PONV Risk Score and Plan:   Airway Management Planned:   Additional Equipment:   Intra-op Plan:   Post-operative Plan:   Informed Consent:  I have reviewed the patients History and Physical, chart, labs and discussed the procedure including the risks, benefits and alternatives for the proposed anesthesia with the patient or authorized representative who has indicated his/her understanding and acceptance.     Dental Advisory Given  Plan Discussed with: CRNA  Anesthesia Plan Comments: (See PAT note 11/05/2018, Jodell Cipro, PA-C)       Anesthesia Quick Evaluation

## 2018-11-08 NOTE — Telephone Encounter (Signed)
Patient's called to ask if there will be any bedside monitoring after her surgery tomorrow.  She states that in 2017 she went into cardiac arrest after her spine fusion because of the medication given after the surgery.  She is worried because her husband will not be able to be with her in the room at recovery.  Patient stated that she did receive a call from [facility] today going over the Covid-19 screening,  so she asked the question about bedside monitoring and was told that she will need to talk to the anesthesiologist. She has asked that Dr. August Saucer be very aware of this so there is no repeat.

## 2018-11-08 NOTE — Progress Notes (Signed)
SPOKE W/  Stephanie Noble     SCREENING SYMPTOMS OF COVID 19:   COUGH--no  RUNNY NOSE--- no  SORE THROAT---no  NASAL CONGESTION----no  SNEEZING----no  SHORTNESS OF BREATH---no  DIFFICULTY BREATHING---no  TEMP >100.0 -----no  UNEXPLAINED BODY ACHES------no  CHILLS -------- no  HEADACHES ---------no  LOSS OF SMELL/ TASTE --------no    HAVE YOU OR ANY FAMILY MEMBER TRAVELLED PAST 14 DAYS OUT OF THE   COUNTY---no STATE----no COUNTRY----no  HAVE YOU OR ANY FAMILY MEMBER BEEN EXPOSED TO ANYONE WITH COVID 19? no

## 2018-11-08 NOTE — Anesthesia Preprocedure Evaluation (Addendum)
Anesthesia Evaluation  Patient identified by MRN, date of birth, ID band Patient awake    Reviewed: Allergy & Precautions, H&P , NPO status , Patient's Chart, lab work & pertinent test results, reviewed documented beta blocker date and time   History of Anesthesia Complications (+) PONV and history of anesthetic complications  Airway Mallampati: I  TM Distance: >3 FB Neck ROM: full    Dental no notable dental hx. (+) Teeth Intact   Pulmonary asthma ,    Pulmonary exam normal breath sounds clear to auscultation       Cardiovascular Exercise Tolerance: Good negative cardio ROS   Rhythm:regular Rate:Normal  EKG: 11/05/2018 Rate 68 bpm Normal sinus rhythm   CV: Echo 04/19/2016  Left ventricle: The cavity size was normal. Wall thickness was normal. Systolic function was normal. The estimated ejection fraction was in the range of 60% to 65%. Wall motion was normal; there were no regional wall motion abnormalities. Left ventricular diastolic function parameters were normal. - Aortic valve: Valve area (VTI): 3.78 cm^2. Valve area (Vmax): 4.28 cm^2. - Atrial septum: No defect or patent foramen ovale was identified. - Pulmonary arteries: Systolic pressure was mildly increased. PA peak pressure: 36 mm Hg (S).   Neuro/Psych PSYCHIATRIC DISORDERS Anxiety Depression negative neurological ROS     GI/Hepatic negative GI ROS, Neg liver ROS,   Endo/Other  negative endocrine ROS  Renal/GU negative Renal ROS  negative genitourinary   Musculoskeletal  (+) Arthritis , Osteoarthritis,    Abdominal   Peds  Hematology negative hematology ROS (+)   Anesthesia Other Findings   Reproductive/Obstetrics negative OB ROS                          Anesthesia Physical Anesthesia Plan  ASA: III  Anesthesia Plan: General   Post-op Pain Management:    Induction:   PONV Risk Score and Plan: 3  and Ondansetron, Treatment may vary due to age or medical condition, Dexamethasone, Scopolamine patch - Pre-op and Midazolam  Airway Management Planned: LMA and Oral ETT  Additional Equipment:   Intra-op Plan:   Post-operative Plan: Extubation in OR  Informed Consent: I have reviewed the patients History and Physical, chart, labs and discussed the procedure including the risks, benefits and alternatives for the proposed anesthesia with the patient or authorized representative who has indicated his/her understanding and acceptance.     Dental Advisory Given  Plan Discussed with: CRNA, Anesthesiologist and Surgeon  Anesthesia Plan Comments:        Anesthesia Quick Evaluation

## 2018-11-09 ENCOUNTER — Ambulatory Visit (HOSPITAL_COMMUNITY): Payer: BLUE CROSS/BLUE SHIELD | Admitting: Physician Assistant

## 2018-11-09 ENCOUNTER — Ambulatory Visit (HOSPITAL_COMMUNITY): Payer: BLUE CROSS/BLUE SHIELD | Admitting: Certified Registered"

## 2018-11-09 ENCOUNTER — Inpatient Hospital Stay (HOSPITAL_COMMUNITY)
Admission: AD | Admit: 2018-11-09 | Discharge: 2018-11-10 | DRG: 470 | Disposition: A | Discharge status: Home Health | Payer: BLUE CROSS/BLUE SHIELD | Attending: Orthopedic Surgery | Admitting: Orthopedic Surgery

## 2018-11-09 ENCOUNTER — Ambulatory Visit (HOSPITAL_COMMUNITY): Payer: BLUE CROSS/BLUE SHIELD

## 2018-11-09 ENCOUNTER — Inpatient Hospital Stay (HOSPITAL_COMMUNITY): Payer: BLUE CROSS/BLUE SHIELD

## 2018-11-09 ENCOUNTER — Encounter (HOSPITAL_COMMUNITY): Payer: Self-pay | Admitting: *Deleted

## 2018-11-09 ENCOUNTER — Encounter (HOSPITAL_COMMUNITY)
Admission: AD | Disposition: A | Discharge status: Home Health | Payer: Self-pay | Source: Home / Self Care | Attending: Orthopedic Surgery

## 2018-11-09 DIAGNOSIS — Z791 Long term (current) use of non-steroidal anti-inflammatories (NSAID): Secondary | ICD-10-CM | POA: Diagnosis not present

## 2018-11-09 DIAGNOSIS — Z6823 Body mass index (BMI) 23.0-23.9, adult: Secondary | ICD-10-CM | POA: Diagnosis not present

## 2018-11-09 DIAGNOSIS — F4310 Post-traumatic stress disorder, unspecified: Secondary | ICD-10-CM | POA: Diagnosis present

## 2018-11-09 DIAGNOSIS — Z981 Arthrodesis status: Secondary | ICD-10-CM

## 2018-11-09 DIAGNOSIS — F329 Major depressive disorder, single episode, unspecified: Secondary | ICD-10-CM | POA: Diagnosis present

## 2018-11-09 DIAGNOSIS — Z419 Encounter for procedure for purposes other than remedying health state, unspecified: Secondary | ICD-10-CM

## 2018-11-09 DIAGNOSIS — Z1159 Encounter for screening for other viral diseases: Secondary | ICD-10-CM | POA: Diagnosis not present

## 2018-11-09 DIAGNOSIS — E669 Obesity, unspecified: Secondary | ICD-10-CM | POA: Diagnosis present

## 2018-11-09 DIAGNOSIS — G4700 Insomnia, unspecified: Secondary | ICD-10-CM | POA: Diagnosis present

## 2018-11-09 DIAGNOSIS — J45909 Unspecified asthma, uncomplicated: Secondary | ICD-10-CM | POA: Diagnosis present

## 2018-11-09 DIAGNOSIS — M25552 Pain in left hip: Secondary | ICD-10-CM | POA: Diagnosis present

## 2018-11-09 DIAGNOSIS — Z888 Allergy status to other drugs, medicaments and biological substances status: Secondary | ICD-10-CM

## 2018-11-09 DIAGNOSIS — M1610 Unilateral primary osteoarthritis, unspecified hip: Secondary | ICD-10-CM | POA: Diagnosis present

## 2018-11-09 DIAGNOSIS — Z79899 Other long term (current) drug therapy: Secondary | ICD-10-CM

## 2018-11-09 DIAGNOSIS — F419 Anxiety disorder, unspecified: Secondary | ICD-10-CM | POA: Diagnosis present

## 2018-11-09 DIAGNOSIS — M1612 Unilateral primary osteoarthritis, left hip: Principal | ICD-10-CM | POA: Diagnosis present

## 2018-11-09 HISTORY — PX: TOTAL HIP ARTHROPLASTY: SHX124

## 2018-11-09 LAB — TYPE AND SCREEN
ABO/RH(D): O POS
Antibody Screen: NEGATIVE

## 2018-11-09 SURGERY — ARTHROPLASTY, HIP, TOTAL, ANTERIOR APPROACH
Anesthesia: General | Laterality: Left

## 2018-11-09 MED ORDER — FENTANYL CITRATE (PF) 100 MCG/2ML IJ SOLN
25.0000 ug | INTRAMUSCULAR | Status: DC | PRN
  Administered 2018-11-09 (×2): 25 ug via INTRAVENOUS

## 2018-11-09 MED ORDER — METHOCARBAMOL 500 MG IVPB - SIMPLE MED
INTRAVENOUS | Status: AC
  Administered 2018-11-09: 11:00:00 500 mg via INTRAVENOUS
  Filled 2018-11-09: qty 50

## 2018-11-09 MED ORDER — PROPOFOL 10 MG/ML IV BOLUS
INTRAVENOUS | Status: DC | PRN
  Administered 2018-11-09: 130 mg via INTRAVENOUS

## 2018-11-09 MED ORDER — ACETAMINOPHEN 160 MG/5ML PO SOLN: 325.0000 mg | ORAL | Status: DC | PRN

## 2018-11-09 MED ORDER — METHOCARBAMOL 500 MG IVPB - SIMPLE MED
500.0000 mg | Freq: Four times a day (QID) | INTRAVENOUS | Status: DC | PRN
  Administered 2018-11-09: 11:00:00 500 mg via INTRAVENOUS
  Filled 2018-11-09: qty 50

## 2018-11-09 MED ORDER — EPHEDRINE 5 MG/ML INJ
INTRAVENOUS | Status: AC
  Filled 2018-11-09: qty 10

## 2018-11-09 MED ORDER — ONDANSETRON HCL 4 MG/2ML IJ SOLN
INTRAMUSCULAR | Status: DC | PRN
  Administered 2018-11-09: 4 mg via INTRAVENOUS

## 2018-11-09 MED ORDER — LACTATED RINGERS IV SOLN
INTRAVENOUS | Status: DC
  Administered 2018-11-09: 06:00:00 via INTRAVENOUS

## 2018-11-09 MED ORDER — HYDROMORPHONE HCL 1 MG/ML IJ SOLN
0.5000 mg | INTRAMUSCULAR | Status: DC | PRN
  Filled 2018-11-09 (×3): qty 0.5

## 2018-11-09 MED ORDER — SODIUM CHLORIDE (PF) 0.9 % IJ SOLN
INTRAMUSCULAR | Status: AC
  Filled 2018-11-09: qty 20

## 2018-11-09 MED ORDER — OXYCODONE HCL 5 MG/5ML PO SOLN: 5.0000 mg | Freq: Once | ORAL | Status: DC | PRN

## 2018-11-09 MED ORDER — ALBUMIN HUMAN 5 % IV SOLN
INTRAVENOUS | Status: DC | PRN
  Administered 2018-11-09 (×2): via INTRAVENOUS

## 2018-11-09 MED ORDER — LACTATED RINGERS IV SOLN
INTRAVENOUS | Status: AC
  Administered 2018-11-09 (×2): via INTRAVENOUS

## 2018-11-09 MED ORDER — DEXAMETHASONE SODIUM PHOSPHATE 10 MG/ML IJ SOLN
INTRAMUSCULAR | Status: AC
  Filled 2018-11-09: qty 1

## 2018-11-09 MED ORDER — MIDAZOLAM HCL 2 MG/2ML IJ SOLN
INTRAMUSCULAR | Status: DC | PRN
  Administered 2018-11-09: 2 mg via INTRAVENOUS

## 2018-11-09 MED ORDER — MENTHOL 3 MG MT LOZG: 1.0000 | LOZENGE | OROMUCOSAL | Status: DC | PRN

## 2018-11-09 MED ORDER — SCOPOLAMINE 1 MG/3DAYS TD PT72
MEDICATED_PATCH | TRANSDERMAL | Status: AC
  Filled 2018-11-09: qty 1

## 2018-11-09 MED ORDER — LACTATED RINGERS IV BOLUS
500.0000 mL | Freq: Once | INTRAVENOUS | Status: AC
  Administered 2018-11-09: 19:00:00 via INTRAVENOUS

## 2018-11-09 MED ORDER — ALBUTEROL SULFATE HFA 108 (90 BASE) MCG/ACT IN AERS: 2.0000 | INHALATION_SPRAY | Freq: Four times a day (QID) | RESPIRATORY_TRACT | Status: DC | PRN

## 2018-11-09 MED ORDER — EPHEDRINE SULFATE-NACL 50-0.9 MG/10ML-% IV SOSY
PREFILLED_SYRINGE | INTRAVENOUS | Status: DC | PRN
  Administered 2018-11-09: 5 mg via INTRAVENOUS
  Administered 2018-11-09: 10 mg via INTRAVENOUS
  Administered 2018-11-09 (×7): 5 mg via INTRAVENOUS

## 2018-11-09 MED ORDER — ONDANSETRON HCL 4 MG/2ML IJ SOLN
INTRAMUSCULAR | Status: AC
  Filled 2018-11-09: qty 2

## 2018-11-09 MED ORDER — LIDOCAINE 2% (20 MG/ML) 5 ML SYRINGE
INTRAMUSCULAR | Status: AC
  Filled 2018-11-09: qty 5

## 2018-11-09 MED ORDER — LIDOCAINE 2% (20 MG/ML) 5 ML SYRINGE
INTRAMUSCULAR | Status: DC | PRN
  Administered 2018-11-09: 80 mg via INTRAVENOUS

## 2018-11-09 MED ORDER — HYDROMORPHONE HCL 1 MG/ML IJ SOLN
0.2500 mg | INTRAMUSCULAR | Status: DC | PRN
  Administered 2018-11-09 (×4): 0.5 mg via INTRAVENOUS

## 2018-11-09 MED ORDER — GABAPENTIN 300 MG PO CAPS: 300.0000 mg | ORAL_CAPSULE | ORAL | Status: DC

## 2018-11-09 MED ORDER — PROPOFOL 10 MG/ML IV BOLUS
INTRAVENOUS | Status: AC
  Filled 2018-11-09: qty 40

## 2018-11-09 MED ORDER — BUPIVACAINE LIPOSOME 1.3 % IJ SUSP
20.0000 mL | Freq: Once | INTRAMUSCULAR | Status: AC
  Administered 2018-11-09: 10:00:00 20 mL
  Filled 2018-11-09: qty 20

## 2018-11-09 MED ORDER — GABAPENTIN 300 MG PO CAPS
900.0000 mg | ORAL_CAPSULE | Freq: Every day | ORAL | Status: DC
  Administered 2018-11-09: 22:00:00 900 mg via ORAL
  Filled 2018-11-09: qty 3

## 2018-11-09 MED ORDER — METOCLOPRAMIDE HCL 5 MG/ML IJ SOLN: 5.0000 mg | Freq: Three times a day (TID) | INTRAMUSCULAR | Status: DC | PRN

## 2018-11-09 MED ORDER — OXYCODONE HCL 5 MG PO TABS: 5.0000 mg | ORAL_TABLET | Freq: Once | ORAL | Status: DC | PRN

## 2018-11-09 MED ORDER — METHOCARBAMOL 500 MG PO TABS
500.0000 mg | ORAL_TABLET | Freq: Four times a day (QID) | ORAL | Status: DC | PRN
  Administered 2018-11-10: 500 mg via ORAL
  Filled 2018-11-09 (×2): qty 1

## 2018-11-09 MED ORDER — CELECOXIB 200 MG PO CAPS
200.0000 mg | ORAL_CAPSULE | Freq: Every day | ORAL | Status: DC
  Administered 2018-11-09 – 2018-11-10 (×2): 200 mg via ORAL
  Filled 2018-11-09 (×2): qty 1

## 2018-11-09 MED ORDER — CEFAZOLIN SODIUM-DEXTROSE 2-4 GM/100ML-% IV SOLN
2.0000 g | INTRAVENOUS | Status: AC
  Administered 2018-11-09: 2 g via INTRAVENOUS
  Filled 2018-11-09: qty 100

## 2018-11-09 MED ORDER — SODIUM CHLORIDE 0.9 % IR SOLN
Status: DC | PRN
  Administered 2018-11-09: 6000 mL

## 2018-11-09 MED ORDER — GABAPENTIN 300 MG PO CAPS
300.0000 mg | ORAL_CAPSULE | Freq: Every day | ORAL | Status: DC
  Administered 2018-11-10: 300 mg via ORAL
  Filled 2018-11-09: qty 1

## 2018-11-09 MED ORDER — ONDANSETRON HCL 4 MG/2ML IJ SOLN: 4.0000 mg | Freq: Four times a day (QID) | INTRAMUSCULAR | Status: DC | PRN

## 2018-11-09 MED ORDER — SUGAMMADEX SODIUM 200 MG/2ML IV SOLN
INTRAVENOUS | Status: AC
  Filled 2018-11-09: qty 2

## 2018-11-09 MED ORDER — ASPIRIN 81 MG PO CHEW
81.0000 mg | CHEWABLE_TABLET | Freq: Two times a day (BID) | ORAL | Status: DC
  Administered 2018-11-09 – 2018-11-10 (×2): 81 mg via ORAL
  Filled 2018-11-09 (×2): qty 1

## 2018-11-09 MED ORDER — TRANEXAMIC ACID-NACL 1000-0.7 MG/100ML-% IV SOLN
INTRAVENOUS | Status: DC | PRN
  Administered 2018-11-09: 1000 mg via INTRAVENOUS

## 2018-11-09 MED ORDER — ROCURONIUM BROMIDE 10 MG/ML (PF) SYRINGE
PREFILLED_SYRINGE | INTRAVENOUS | Status: AC
  Filled 2018-11-09: qty 10

## 2018-11-09 MED ORDER — ONDANSETRON HCL 4 MG PO TABS: 4.0000 mg | ORAL_TABLET | Freq: Four times a day (QID) | ORAL | Status: DC | PRN

## 2018-11-09 MED ORDER — LACTATED RINGERS IV SOLN: INTRAVENOUS | Status: DC

## 2018-11-09 MED ORDER — PHENOL 1.4 % MT LIQD: 1.0000 | OROMUCOSAL | Status: DC | PRN

## 2018-11-09 MED ORDER — CEFAZOLIN SODIUM-DEXTROSE 2-4 GM/100ML-% IV SOLN
2.0000 g | Freq: Four times a day (QID) | INTRAVENOUS | Status: AC
  Administered 2018-11-09 (×2): 2 g via INTRAVENOUS
  Filled 2018-11-09 (×2): qty 100

## 2018-11-09 MED ORDER — ALBUMIN HUMAN 5 % IV SOLN
INTRAVENOUS | Status: AC
  Filled 2018-11-09: qty 500

## 2018-11-09 MED ORDER — ONDANSETRON HCL 4 MG/2ML IJ SOLN: 4.0000 mg | Freq: Once | INTRAMUSCULAR | Status: DC | PRN

## 2018-11-09 MED ORDER — PHENYLEPHRINE HCL (PRESSORS) 10 MG/ML IV SOLN
INTRAVENOUS | Status: AC
  Filled 2018-11-09: qty 1

## 2018-11-09 MED ORDER — CHLORHEXIDINE GLUCONATE 4 % EX LIQD: 60.0000 mL | Freq: Once | CUTANEOUS | Status: DC

## 2018-11-09 MED ORDER — FENTANYL CITRATE (PF) 250 MCG/5ML IJ SOLN
INTRAMUSCULAR | Status: AC
  Filled 2018-11-09: qty 5

## 2018-11-09 MED ORDER — ACETAMINOPHEN 325 MG PO TABS: 325.0000 mg | ORAL_TABLET | ORAL | Status: DC | PRN

## 2018-11-09 MED ORDER — OXYCODONE HCL 5 MG PO TABS
5.0000 mg | ORAL_TABLET | ORAL | Status: DC | PRN
  Administered 2018-11-09 – 2018-11-10 (×6): 5 mg via ORAL
  Filled 2018-11-09 (×7): qty 1

## 2018-11-09 MED ORDER — SCOPOLAMINE 1 MG/3DAYS TD PT72
MEDICATED_PATCH | TRANSDERMAL | Status: DC | PRN
  Administered 2018-11-09: 1 via TRANSDERMAL

## 2018-11-09 MED ORDER — FENTANYL CITRATE (PF) 250 MCG/5ML IJ SOLN
INTRAMUSCULAR | Status: DC | PRN
  Administered 2018-11-09 (×2): 50 ug via INTRAVENOUS
  Administered 2018-11-09: 100 ug via INTRAVENOUS
  Administered 2018-11-09: 50 ug via INTRAVENOUS

## 2018-11-09 MED ORDER — DOCUSATE SODIUM 100 MG PO CAPS
100.0000 mg | ORAL_CAPSULE | Freq: Two times a day (BID) | ORAL | Status: DC
  Administered 2018-11-09 – 2018-11-10 (×3): 100 mg via ORAL
  Filled 2018-11-09 (×3): qty 1

## 2018-11-09 MED ORDER — MEPERIDINE HCL 50 MG/ML IJ SOLN: 6.2500 mg | INTRAMUSCULAR | Status: DC | PRN

## 2018-11-09 MED ORDER — FENTANYL CITRATE (PF) 100 MCG/2ML IJ SOLN
INTRAMUSCULAR | Status: AC
  Administered 2018-11-09: 11:00:00 25 ug via INTRAVENOUS
  Filled 2018-11-09: qty 2

## 2018-11-09 MED ORDER — ACETAMINOPHEN 500 MG PO TABS
1000.0000 mg | ORAL_TABLET | Freq: Four times a day (QID) | ORAL | Status: AC
  Administered 2018-11-09 – 2018-11-10 (×4): 1000 mg via ORAL
  Filled 2018-11-09 (×4): qty 2

## 2018-11-09 MED ORDER — SUGAMMADEX SODIUM 200 MG/2ML IV SOLN
INTRAVENOUS | Status: DC | PRN
  Administered 2018-11-09: 200 mg via INTRAVENOUS

## 2018-11-09 MED ORDER — HYDROMORPHONE HCL 1 MG/ML IJ SOLN
INTRAMUSCULAR | Status: AC
  Administered 2018-11-09: 12:00:00 0.5 mg via INTRAVENOUS
  Filled 2018-11-09: qty 1

## 2018-11-09 MED ORDER — ROCURONIUM BROMIDE 10 MG/ML (PF) SYRINGE
PREFILLED_SYRINGE | INTRAVENOUS | Status: DC | PRN
  Administered 2018-11-09: 100 mg via INTRAVENOUS
  Administered 2018-11-09 (×2): 10 mg via INTRAVENOUS

## 2018-11-09 MED ORDER — ALBUTEROL SULFATE (2.5 MG/3ML) 0.083% IN NEBU: 2.5000 mg | INHALATION_SOLUTION | RESPIRATORY_TRACT | Status: DC | PRN

## 2018-11-09 MED ORDER — STERILE WATER FOR IRRIGATION IR SOLN
Status: DC | PRN
  Administered 2018-11-09: 2000 mL

## 2018-11-09 MED ORDER — DEXAMETHASONE SODIUM PHOSPHATE 10 MG/ML IJ SOLN
INTRAMUSCULAR | Status: DC | PRN
  Administered 2018-11-09: 8 mg via INTRAVENOUS

## 2018-11-09 MED ORDER — MIDAZOLAM HCL 2 MG/2ML IJ SOLN
INTRAMUSCULAR | Status: AC
  Filled 2018-11-09: qty 2

## 2018-11-09 MED ORDER — ZOLPIDEM TARTRATE 5 MG PO TABS: 5.0000 mg | ORAL_TABLET | Freq: Every evening | ORAL | Status: DC | PRN

## 2018-11-09 MED ORDER — TRANEXAMIC ACID-NACL 1000-0.7 MG/100ML-% IV SOLN
INTRAVENOUS | Status: AC
  Filled 2018-11-09: qty 100

## 2018-11-09 MED ORDER — DULOXETINE HCL 20 MG PO CPEP
20.0000 mg | ORAL_CAPSULE | Freq: Every day | ORAL | Status: DC
  Administered 2018-11-09 – 2018-11-10 (×2): 20 mg via ORAL
  Filled 2018-11-09 (×2): qty 1

## 2018-11-09 MED ORDER — METOCLOPRAMIDE HCL 5 MG PO TABS
5.0000 mg | ORAL_TABLET | Freq: Three times a day (TID) | ORAL | Status: DC | PRN
  Administered 2018-11-09: 13:00:00 5 mg via ORAL
  Filled 2018-11-09: qty 2

## 2018-11-09 MED ORDER — SODIUM CHLORIDE (PF) 0.9 % IJ SOLN
INTRAMUSCULAR | Status: DC | PRN
  Administered 2018-11-09: 20 mL

## 2018-11-09 MED ORDER — GABAPENTIN 300 MG PO CAPS
600.0000 mg | ORAL_CAPSULE | Freq: Every day | ORAL | Status: DC
  Administered 2018-11-09: 13:00:00 600 mg via ORAL
  Filled 2018-11-09: qty 2

## 2018-11-09 SURGICAL SUPPLY — 59 items
APL PRP STRL LF DISP 70% ISPRP (MISCELLANEOUS) ×1
APL SKNCLS STERI-STRIP NONHPOA (GAUZE/BANDAGES/DRESSINGS) ×1
BAG DECANTER FOR FLEXI CONT (MISCELLANEOUS) ×3 IMPLANT
BAG SPEC THK2 15X12 ZIP CLS (MISCELLANEOUS)
BAG ZIPLOCK 12X15 (MISCELLANEOUS) IMPLANT
BENZOIN TINCTURE PRP APPL 2/3 (GAUZE/BANDAGES/DRESSINGS) ×2 IMPLANT
BLADE SAGITTAL 25.0X1.19X90 (BLADE) ×1 IMPLANT
BLADE SAGITTAL 25.0X1.19X90MM (BLADE) ×1
BLADE SAW SGTL 18X1.27X75 (BLADE) ×2 IMPLANT
BLADE SAW SGTL 18X1.27X75MM (BLADE) ×1
BLADE SURG SZ10 CARB STEEL (BLADE) ×6 IMPLANT
CELLS DAT CNTRL 66122 CELL SVR (MISCELLANEOUS) ×1 IMPLANT
CHLORAPREP W/TINT 26 (MISCELLANEOUS) ×3 IMPLANT
CLOSURE WOUND 1/2 X4 (GAUZE/BANDAGES/DRESSINGS)
COVER PERINEAL POST (MISCELLANEOUS) ×3 IMPLANT
COVER SURGICAL LIGHT HANDLE (MISCELLANEOUS) ×3 IMPLANT
COVER WAND RF STERILE (DRAPES) IMPLANT
DRAPE C-ARM 42X120 X-RAY (DRAPES) ×3 IMPLANT
DRAPE POUCH INSTRU U-SHP 10X18 (DRAPES) ×3 IMPLANT
DRAPE STERI IOBAN 125X83 (DRAPES) ×3 IMPLANT
DRAPE U-SHAPE 47X51 STRL (DRAPES) ×9 IMPLANT
DRESSING AQUACEL AG SP 3.5X10 (GAUZE/BANDAGES/DRESSINGS) IMPLANT
DRSG AQUACEL AG ADV 3.5X10 (GAUZE/BANDAGES/DRESSINGS) ×3 IMPLANT
DRSG AQUACEL AG SP 3.5X10 (GAUZE/BANDAGES/DRESSINGS) ×3
ELECT BLADE TIP CTD 4 INCH (ELECTRODE) ×6 IMPLANT
ELECT REM PT RETURN 15FT ADLT (MISCELLANEOUS) ×3 IMPLANT
FACESHIELD WRAPAROUND (MASK) ×12 IMPLANT
FACESHIELD WRAPAROUND OR TEAM (MASK) ×4 IMPLANT
GLOVE BIOGEL PI IND STRL 8 (GLOVE) ×2 IMPLANT
GLOVE BIOGEL PI INDICATOR 8 (GLOVE) ×4
GLOVE ECLIPSE 8.0 STRL XLNG CF (GLOVE) ×3 IMPLANT
GOWN STRL REUS W/TWL XL LVL3 (GOWN DISPOSABLE) ×6 IMPLANT
HEAD CERAMIC DELTA 36 PLUS 1.5 (Hips) ×2 IMPLANT
HOOD PEEL AWAY FLYTE STAYCOOL (MISCELLANEOUS) ×6 IMPLANT
KIT BASIN OR (CUSTOM PROCEDURE TRAY) ×3 IMPLANT
KIT TURNOVER KIT A (KITS) IMPLANT
LINER NEUTRAL 52X36MM PLUS 4 (Liner) ×2 IMPLANT
MARKER SKIN DUAL TIP RULER LAB (MISCELLANEOUS) ×3 IMPLANT
NEEDLE HYPO 22GX1.5 SAFETY (NEEDLE) ×3 IMPLANT
PIN SECTOR W/GRIP ACE CUP 52MM (Hips) ×2 IMPLANT
RETRACTOR WND ALEXIS 18 MED (MISCELLANEOUS) ×1 IMPLANT
RTRCTR WOUND ALEXIS 18CM MED (MISCELLANEOUS) ×3
SCREW 6.5MMX25MM (Screw) ×2 IMPLANT
SCREW 6.5MMX30MM (Screw) IMPLANT
STAPLER VISISTAT 35W (STAPLE) IMPLANT
STEM CORAIL KLA14 (Stem) ×2 IMPLANT
STRIP CLOSURE SKIN 1/2X4 (GAUZE/BANDAGES/DRESSINGS) IMPLANT
SUT ETHIBOND NAB CT1 #1 30IN (SUTURE) ×9 IMPLANT
SUT MNCRL AB 3-0 PS2 18 (SUTURE) ×2 IMPLANT
SUT MNCRL AB 4-0 PS2 18 (SUTURE) IMPLANT
SUT VIC AB 0 CT1 36 (SUTURE) ×7 IMPLANT
SUT VIC AB 1 CT1 36 (SUTURE) ×10 IMPLANT
SUT VIC AB 2-0 CT1 27 (SUTURE) ×6
SUT VIC AB 2-0 CT1 TAPERPNT 27 (SUTURE) ×2 IMPLANT
SYR 30ML LL (SYRINGE) ×6 IMPLANT
TAPE STRIPS DRAPE STRL (GAUZE/BANDAGES/DRESSINGS) ×2 IMPLANT
TOWEL OR 17X26 10 PK STRL BLUE (TOWEL DISPOSABLE) ×3 IMPLANT
TOWEL OR NON WOVEN STRL DISP B (DISPOSABLE) ×3 IMPLANT
TRAY FOLEY MTR SLVR 16FR STAT (SET/KITS/TRAYS/PACK) ×3 IMPLANT

## 2018-11-09 NOTE — Op Note (Signed)
NAME: CLOVIA, DURNIN MEDICAL RECORD XL:[id number] ACCOUNT [id number] DATE OF BIRTH:1955-10-08 FACILITY: [facility] LOCATION: [facility] PHYSICIAN:GREGORY Diamantina Providence, MD  OPERATIVE REPORT  DATE OF PROCEDURE:  11/09/2018  PREOPERATIVE DIAGNOSIS:  Left hip arthritis.  POSTOPERATIVE DIAGNOSIS:  Left hip arthritis.  PROCEDURE:  Left total hip replacement using DePuy components, Corail stem KLA size 14 with 52 mm Pinnacle press-fit cup with 1 screw and a +4 liner.  SURGEON:  Cammy Copa, MD  ASSISTANT:  Rexene Edison, PA-C  INDICATION:  Natausha is a 63 year old female with left hip arthritis, end-stage, which was refractory to nonoperative management.  She presents for operative management after explanation of risks and benefits.  PROCEDURE IN DETAIL:  The patient was brought to the operating room where general anesthetic was induced.  Preoperative antibiotics were administered.  Time-out was called.  The patient was placed on the Hana bed.  Left hip and leg area were prescrubbed  with alcohol and Betadine and allowed to air dry, then prepped with ChloraPrep solution and draped in a sterile manner.  Time-out was called.  Incision was then made about 2 cm distal and posterior to the anterior superior iliac crest on the left.  Skin  and subcutaneous tissue was sharply divided.  The fascia lata was encountered and divided from the ASIS distally.  Self-retaining condom-type retractor was placed.  The tensor fascia lata was retracted laterally, and the circumflex vessels were  coagulated.  Retractors were then placed on the superior and inferior aspect of the femoral neck.  Capsule was incised in a T-shaped fashion and tagged with a suture #1 Ethibond.  Under fluoroscopic guidance, the neck cut was made approximately 1  fingerbreadth above the lesser.  The femoral head was removed.  Significant arthritis was present.  Following this, a bent 90 retractor was placed along with cerebellar.  The labrum was  removed circumferentially.  Reaming was then performed under  fluoroscopic guidance.  The patient has had prior back fusion surgery, and in accordance with preoperative flexion, extension x-rays, slightly more anteversion than normal was added into the cup position.  The cup was placed in approximately 20 degrees  of anteversion and 45 degrees of abduction.  Increased offset was also chosen in order to minimize the risk of dislocation, which is higher in this patient population.  Following reaming up to 51 mm, a 52 cup was placed with good purchase obtained.  One  screw was placed for added fixation.  Bone quality was reasonable but not spectacular.  The +4 mm liner was then placed.  Some medialization did occur with the cup, but not through the inner table.  The inferior aspect of the cup approximately equal to  the inferior aspect of the tear drop.  At this time, attention was then directed towards the femur.  The leg was then externally rotated.  A femoral retractor was placed.  The leg was then taken into extension and external rotation adduction.   Lateralization was performed with the rongeur and the chili pepper initiating broach.  Then, the femur was broached up to a size 14 with coplaning performed.  A 14 stem was placed in the patient's native version parallel to the posterior cortex of the  calcar.  At this time with a trial stem in place, the +1.5 ball was then reduced.  The patient had excellent stability with external rotation to 60 and extension to 60.  Then, the patient was actually taken out of the Hana bed stirrup, and she  was flexed  up to 90 degrees, internally rotated, and also remained stable.  At this time, the trial components were removed.  Thorough irrigation was performed of the femur.  True components placed with good stability and fixation of the press-fit fixation of the  stem, which was a Corail stem.  At this time, a +1.5 mm ball was placed, and the leg lengths were  approximately equal.  The patient maintained the same stability parameters.  Thorough irrigation was performed.  Exparel was injected around the capsule.   The capsule was closed then using #1 Vicryl suture.  The fascia lata was closed using #1 Vicryl suture followed by interrupted inverted 0 Vicryl suture, 2-0 Vicryl suture and 3-0 Monocryl.  Steri-Strips and an Aquacel dressing were placed.  Leg lengths  approximately equal at the conclusion of the case.  The patient tolerated the procedure well without immediate complications.  She was transferred to the recovery room in stable condition.  Bronson CurbGil Clark's assistance was required at all times for opening and  closing, retraction, and reduction.  His assistance was a medical necessity.  LN/NUANCE  D:11/09/2018 T:11/09/2018 JOB:[id number]

## 2018-11-09 NOTE — Anesthesia Procedure Notes (Signed)
Procedure Name: Intubation Date/Time: 11/09/2018 7:42 AM Performed by: Eben Burow, CRNA Pre-anesthesia Checklist: Patient identified, Emergency Drugs available, Suction available, Patient being monitored and Timeout performed Patient Re-evaluated:Patient Re-evaluated prior to induction Oxygen Delivery Method: Circle system utilized Preoxygenation: Pre-oxygenation with 100% oxygen Induction Type: IV induction and Rapid sequence Laryngoscope Size: Mac and 4 Grade View: Grade I Tube type: Oral Tube size: 7.0 mm Number of attempts: 1 Airway Equipment and Method: Stylet Placement Confirmation: ETT inserted through vocal cords under direct vision,  positive ETCO2 and breath sounds checked- equal and bilateral Secured at: 21 cm Tube secured with: Tape Dental Injury: Teeth and Oropharynx as per pre-operative assessment

## 2018-11-09 NOTE — Brief Op Note (Signed)
   11/09/2018  10:49 AM  PATIENT:  Stephanie Noble  63 y.o. female  PRE-OPERATIVE DIAGNOSIS:  left hip osteoarthritis  POST-OPERATIVE DIAGNOSIS:  left hip osteoarthritis  PROCEDURE:  Procedure(s): LEFT TOTAL HIP ARTHROPLASTY DIRECT ANTERIOR APPROACH  SURGEON:  Surgeon(s): August Saucer, Corrie Mckusick, MD  ASSISTANT: Rexene Edison  ANESTHESIA:   general  EBL: 400 ml    Total I/O In: 2500 [I.V.:2000; IV Piggyback:500] Out: 1250 [Urine:600; Blood:650]  BLOOD ADMINISTERED: none  DRAINS: none   LOCAL MEDICATIONS USED:  exparel  SPECIMEN:  No Specimen  COUNTS:  YES  TOURNIQUET:  * No tourniquets in log *  DICTATION: .Other Dictation: Dictation Number [id number]  PLAN OF CARE: Admit to inpatient   PATIENT DISPOSITION:  PACU - hemodynamically stable

## 2018-11-09 NOTE — Telephone Encounter (Signed)
Pulse ox post op

## 2018-11-09 NOTE — Telephone Encounter (Signed)
fyi

## 2018-11-09 NOTE — Anesthesia Postprocedure Evaluation (Signed)
Anesthesia Post Note  Patient: Stephanie Noble  Procedure(s) Performed: LEFT TOTAL HIP ARTHROPLASTY DIRECT ANTERIOR APPROACH (Left )     Patient location during evaluation: PACU Anesthesia Type: General Level of consciousness: awake and alert Pain management: pain level controlled Vital Signs Assessment: post-procedure vital signs reviewed and stable Respiratory status: spontaneous breathing, nonlabored ventilation, respiratory function stable and patient connected to nasal cannula oxygen Cardiovascular status: blood pressure returned to baseline and stable Postop Assessment: no apparent nausea or vomiting Anesthetic complications: no    Last Vitals:  Vitals:   11/09/18 1841 11/09/18 2058  BP: (!) 96/59 99/63  Pulse: 79 79  Resp:  14  Temp:  36.7 C  SpO2:  100%    Last Pain:  Vitals:   11/09/18 1841  TempSrc:   PainSc: 6                  Broden Holt

## 2018-11-09 NOTE — Interval H&P Note (Signed)
History and Physical Interval Note:  11/09/2018 7:29 AM  Stephanie Noble  has presented today for surgery, with the diagnosis of left hip osteoarthritis.  The various methods of treatment have been discussed with the patient and family. After consideration of risks, benefits and other options for treatment, the patient has consented to  Procedure(s): LEFT TOTAL HIP ARTHROPLASTY DIRECT ANTERIOR APPROACH (Left) as a surgical intervention.  The patient's history has been reviewed, patient examined, no change in status, stable for surgery.  I have reviewed the patient's chart and labs.  Questions were answered to the patient's satisfaction.     Burnard Bunting

## 2018-11-09 NOTE — Progress Notes (Signed)
[place] RN aware pt in room:  Bed low, call bell within reach of pt, oxygen and pulsox are on pt.  Stephanie Noble NT at beside and informed RN Printmaker and authorized PACU RN to leave.  NT getting vital signs.

## 2018-11-09 NOTE — Evaluation (Addendum)
Physical Therapy Evaluation Patient Details Name: Stephanie CostainJennifer L Noble MRN: [id number] DOB: 03/28/1956 Today's Date: 11/09/2018   History of Present Illness  63 yo female s/p L DA-THA on 11/09/18. PMH includes PTSD, PLIF with post-op cardiac arrest, PTSD, hallux rigidus bilaterally, anxiety, depression, PNA, cervical fusion.   Clinical Impression   Pt presents with L hip pain, anxiety with mobility, difficulty performing bed mobility/transfers, and decreased activity tolerance due to L hip pain and anxiety about WB on LLE. Pt to benefit from acute PT to address deficits. Pt ambulated 50 ft with RW with min guard assist, verbal cuing for safety and form provided throughout. Pt states she is very worried about dislocating her hip due to previous back surgeries, PT instructed pt in safe mobility techniques and verbally encouraged pt often. Pt abided by precaution of no hip flexion >90* during mobility today. Pt educated on ankle pumps (20/hour) to perform this afternoon/evening to increase circulation, to pt's tolerance and limited by pain. PT to progress mobility as tolerated, and will continue to follow acutely.          Follow Up Recommendations Follow surgeon's recommendation for DC plan and follow-up therapies;Supervision for mobility/OOB(HHPT - per MD H&P)    Equipment Recommendations  None recommended by PT    Recommendations for Other Services       Precautions / Restrictions Precautions Precautions: Fall;Other (comment) Precaution Comments: Pt states that Dr. August Saucerean states pt is at increased risk of dislocation given previous back surgeries, pt states "I am scared I am going to dislocate it". Pt with precaution of no hip flexion >90* written in PT eval and treat order.  Restrictions Weight Bearing Restrictions: No Other Position/Activity Restrictions: WBAT       Mobility  Bed Mobility Overal bed mobility: Needs Assistance Bed Mobility: Supine to Sit     Supine to sit: Min  assist;HOB elevated     General bed mobility comments: Min assist for LLE lifting and translation to EOB, pt pivoted on gluteal region with propping on her UEs. Pt very anxious due to being scared of dislocating hip, PT assured pt she was moving safely and was at no risk of dislocation with these motions.   Transfers Overall transfer level: Needs assistance Equipment used: Rolling walker (2 wheeled) Transfers: Sit to/from Stand Sit to Stand: Min assist;From elevated surface         General transfer comment: Min assist for power up, pt with self-steadying upon standing with RW. Verbal cuing for hand placement upon rising.   Ambulation/Gait Ambulation/Gait assistance: Min guard Gait Distance (Feet): 50 Feet Assistive device: Rolling walker (2 wheeled) Gait Pattern/deviations: Step-to pattern;Decreased stride length;Decreased weight shift to left;Antalgic Gait velocity: decr    General Gait Details: Min guard for safety, verbal cuing for placement in RW, sequencing with step-to gait, relaxing shoulders as pt kept shoulders in elevated tense position. Pt very reluctant to WB on LLE, heavy use of UEs during ambulation.   Stairs            Wheelchair Mobility    Modified Rankin (Stroke Patients Only)       Balance Overall balance assessment: Mild deficits observed, not formally tested                                           Pertinent Vitals/Pain Pain Assessment: 0-10 Pain Score: 5  Pain Location: L  hip  Pain Descriptors / Indicators: Sore Pain Intervention(s): Monitored during session;Repositioned;Limited activity within patient's tolerance;Premedicated before session;Ice applied    Home Living Family/patient expects to be discharged to:: Private residence Living Arrangements: Spouse/significant other Available Help at Discharge: Family;Available 24 hours/day Type of Home: House Home Access: Stairs to enter Entrance Stairs-Rails: Right Entrance  Stairs-Number of Steps: 5 Home Layout: Able to live on main level with bedroom/bathroom;Two level Home Equipment: Cane - single point;Walker - 2 wheels;Crutches;Bedside commode      Prior Function Level of Independence: Independent with assistive device(s)         Comments: Pt reports using cane for ambulation PTA     Hand Dominance   Dominant Hand: Right    Extremity/Trunk Assessment   Upper Extremity Assessment Upper Extremity Assessment: Overall WFL for tasks assessed    Lower Extremity Assessment Lower Extremity Assessment: Overall WFL for tasks assessed;LLE deficits/detail LLE Deficits / Details: suspected post-surgical hip weakness; able to perform ankle pumps, quad set, heel slides, SLR with lift assist during bed mobility  LLE Sensation: WNL    Cervical / Trunk Assessment Cervical / Trunk Assessment: Normal  Communication   Communication: No difficulties  Cognition Arousal/Alertness: Awake/alert Behavior During Therapy: WFL for tasks assessed/performed Overall Cognitive Status: Within Functional Limits for tasks assessed                                        General Comments      Exercises     Assessment/Plan    PT Assessment Patient needs continued PT services  PT Problem List Decreased strength;Decreased safety awareness;Decreased mobility;Decreased range of motion;Decreased balance;Decreased knowledge of use of DME;Pain;Decreased activity tolerance       PT Treatment Interventions DME instruction;Functional mobility training;Balance training;Patient/family education;Gait training;Therapeutic activities;Therapeutic exercise;Stair training    PT Goals (Current goals can be found in the Care Plan section)  Acute Rehab PT Goals Patient Stated Goal: decrease L hip pain  PT Goal Formulation: With patient Time For Goal Achievement: 11/16/18 Potential to Achieve Goals: Good    Frequency 7X/week   Barriers to discharge         Co-evaluation               AM-PAC PT "6 Clicks" Mobility  Outcome Measure Help needed turning from your back to your side while in a flat bed without using bedrails?: A Little Help needed moving from lying on your back to sitting on the side of a flat bed without using bedrails?: A Little Help needed moving to and from a bed to a chair (including a wheelchair)?: A Little Help needed standing up from a chair using your arms (e.g., wheelchair or bedside chair)?: A Little Help needed to walk in hospital room?: A Little Help needed climbing 3-5 steps with a railing? : A Little 6 Click Score: 18    End of Session Equipment Utilized During Treatment: Gait belt Activity Tolerance: Patient tolerated treatment well;Patient limited by pain Patient left: in chair;with chair alarm set;with call bell/phone within reach Nurse Communication: Mobility status PT Visit Diagnosis: Other abnormalities of gait and mobility (R26.89);Muscle weakness (generalized) (M62.81)    Time: 7829-5621 PT Time Calculation (min) (ACUTE ONLY): 26 min   Charges:   PT Evaluation $PT Eval Low Complexity: 1 Low PT Treatments $Gait Training: 8-22 mins       Chetara Kropp Terrial Rhodes, PT Acute Rehabilitation Services  Pager [phone]  Office [phone]   Mavrick Mcquigg D Despina Hidden 11/09/2018, 4:58 PM

## 2018-11-09 NOTE — Transfer of Care (Signed)
Immediate Anesthesia Transfer of Care Note  Patient: Stephanie Noble  Procedure(s) Performed: LEFT TOTAL HIP ARTHROPLASTY DIRECT ANTERIOR APPROACH (Left )  Patient Location: PACU  Anesthesia Type:General  Level of Consciousness: awake and drowsy  Airway & Oxygen Therapy: Patient Spontanous Breathing  Post-op Assessment: Report given to RN and Post -op Vital signs reviewed and stable  Post vital signs: Reviewed and stable  Last Vitals:  Vitals Value Taken Time  BP 126/81 11/09/2018 11:04 AM  Temp    Pulse 97 11/09/2018 11:05 AM  Resp 13 11/09/2018 11:05 AM  SpO2 100 % 11/09/2018 11:05 AM  Vitals shown include unvalidated device data.  Last Pain:  Vitals:   11/09/18 0613  TempSrc: Oral         Complications: No apparent anesthesia complications

## 2018-11-09 NOTE — Plan of Care (Signed)

## 2018-11-10 ENCOUNTER — Encounter (HOSPITAL_COMMUNITY): Payer: Self-pay | Admitting: Orthopedic Surgery

## 2018-11-10 ENCOUNTER — Other Ambulatory Visit: Payer: Self-pay | Admitting: Family Medicine

## 2018-11-10 MED ORDER — METHOCARBAMOL 500 MG PO TABS: 500.0000 mg | ORAL_TABLET | Freq: Four times a day (QID) | ORAL | 0 refills | Status: DC | PRN

## 2018-11-10 MED ORDER — ASPIRIN 81 MG PO CHEW: 81.0000 mg | CHEWABLE_TABLET | Freq: Two times a day (BID) | ORAL | 0 refills | Status: DC

## 2018-11-10 MED ORDER — OXYCODONE HCL 5 MG PO TABS: 5.0000 mg | ORAL_TABLET | ORAL | 0 refills | Status: DC | PRN

## 2018-11-10 NOTE — Progress Notes (Signed)
Physical Therapy Treatment Patient Details Name: Stephanie Noble MRN: [id number] DOB: [birth year] Today's Date: 11/10/2018    History of Present Illness 63 yo female s/p L DA-THA on 11/09/18. PMH includes PTSD, PLIF with post-op cardiac arrest, PTSD, hallux rigidus bilaterally, anxiety, depression, PNA, cervical fusion.     PT Comments    Patient seen for mobility progression. Patient ambulating in hallway for progressive distances as well as up/down 3 steps with single handrail without need for physical assist and no LOB.  Patient with improved tolerance to weight bearing on surgical LE with ability to demonstrate step through pattern with gait. Making excellent progress towards goals.    Follow Up Recommendations  Follow surgeon's recommendation for DC plan and follow-up therapies;Supervision for mobility/OOB     Equipment Recommendations  None recommended by PT    Recommendations for Other Services       Precautions / Restrictions Precautions Precautions: Fall;Other (comment) Precaution Comments: Pt states that Dr. August Saucer states pt is at increased risk of dislocation given previous back surgeries, pt states "I am scared I am going to dislocate it". Pt with precaution of no hip flexion >90* written in PT eval and treat order.  Restrictions Weight Bearing Restrictions: No Other Position/Activity Restrictions: WBAT     Mobility  Bed Mobility Overal bed mobility: Needs Assistance Bed Mobility: Supine to Sit;Sit to Supine     Supine to sit: Min guard;Supervision Sit to supine: Min guard;Supervision   General bed mobility comments: guiding assist to EOB, otherwise patient performing at supervision level   Transfers Overall transfer level: Needs assistance Equipment used: Rolling walker (2 wheeled) Transfers: Sit to/from Stand Sit to Stand: Min guard         General transfer comment: min guard for safety and initial standing balance - no overt  instability  Ambulation/Gait Ambulation/Gait assistance: Min guard;Supervision Gait Distance (Feet): 350 Feet Assistive device: Rolling walker (2 wheeled) Gait Pattern/deviations: Step-through pattern;Decreased stride length;Decreased weight shift to left Gait velocity: decreased   General Gait Details: min guard/supervision for safety; good step throughout pattern with good weight acceptance onto surgical LE, no buckling   Stairs Stairs: Yes Stairs assistance: Min guard Stair Management: One rail Right;Step to pattern;Forwards Number of Stairs: 3 General stair comments: no instability noted with stair navigation, cueing for sequencing   Wheelchair Mobility    Modified Rankin (Stroke Patients Only)       Balance Overall balance assessment: Mild deficits observed, not formally tested                                          Cognition Arousal/Alertness: Awake/alert Behavior During Therapy: WFL for tasks assessed/performed Overall Cognitive Status: Within Functional Limits for tasks assessed                                        Exercises      General Comments        Pertinent Vitals/Pain Pain Assessment: 0-10 Pain Score: 4  Pain Location: L hip  Pain Descriptors / Indicators: Aching;Discomfort Pain Intervention(s): Limited activity within patient's tolerance;Monitored during session;Repositioned    Home Living                      Prior Function  PT Goals (current goals can now be found in the care plan section) Acute Rehab PT Goals Patient Stated Goal: decrease L hip pain  PT Goal Formulation: With patient Time For Goal Achievement: 11/16/18 Potential to Achieve Goals: Good Progress towards PT goals: Progressing toward goals    Frequency    7X/week      PT Plan Current plan remains appropriate    Co-evaluation              AM-PAC PT "6 Clicks" Mobility   Outcome Measure  Help  needed turning from your back to your side while in a flat bed without using bedrails?: A Little Help needed moving from lying on your back to sitting on the side of a flat bed without using bedrails?: A Little Help needed moving to and from a bed to a chair (including a wheelchair)?: A Little Help needed standing up from a chair using your arms (e.g., wheelchair or bedside chair)?: A Little Help needed to walk in hospital room?: A Little Help needed climbing 3-5 steps with a railing? : A Little 6 Click Score: 18    End of Session Equipment Utilized During Treatment: Gait belt Activity Tolerance: Patient tolerated treatment well Patient left: in bed;with call bell/phone within reach Nurse Communication: Mobility status PT Visit Diagnosis: Other abnormalities of gait and mobility (R26.89);Muscle weakness (generalized) (M62.81)     Time: [id number] PT Time Calculation (min) (ACUTE ONLY): 19 min  Charges:  $Gait Training: 8-22 mins                      Kipp LaurenceStephanie R Aaron, PT, DPT Supplemental Physical Therapist 11/10/18 9:36 AM Pager: [phone] Office: [phone]

## 2018-11-10 NOTE — TOC Initial Note (Signed)
Transition of Care [facility](TOC) - Initial/Assessment Note    Patient Details  Name: Stephanie Noble MRN: [id number] Date of Birth: 01/20/1956  Transition of Care [facility](TOC) CM/SW Contact:    Clearance CootsNicole A Meshilem Machuca, LCSW Phone Number: 11/10/2018, 12:03 PM  Clinical Narrative:                 Has DME. PT arranged with [facility]     Barriers to Discharge: No Barriers Identified   Patient Goals and CMS Choice   CMS Medicare.gov Compare Post Acute Care list provided to:: Patient Choice offered to / list presented to : Patient  Expected Discharge Plan and Services       Post Acute Care Choice: Home Health Living arrangements for the past 2 months: Single Family Home                 DME Arranged: (Patient has rolling walker)         HH Arranged: PT HH Agency: [facility] Date HH Agency Contacted: 11/10/18 Time HH Agency Contacted: 1104 [facility]: Clydie BraunKaren [phone]  Prior Living Arrangements/Services Living arrangements for the past 2 months: Single Family Home Lives with:: Self Patient language and need for interpreter reviewed:: No Do you feel safe going back to the place where you live?: Yes      Need for Family Participation in Patient Care: Yes (Comment) Care giver support system in place?: Yes (comment) Current home services: DME Criminal Activity/Legal Involvement Pertinent to Current Situation/Hospitalization: No - Comment as needed  Activities of Daily Living Home Assistive Devices/Equipment: Eyeglasses, Cane (specify quad or straight), Crutches, Walker (specify type), Bedside commode/3-in-1 ADL Screening (condition at time of admission) Patient's cognitive ability adequate to safely complete daily activities?: Yes Is the patient deaf or have difficulty hearing?: No Does the patient have difficulty seeing, even when wearing glasses/contacts?: No Does the patient have difficulty concentrating,  remembering, or making decisions?: No Patient able to express need for assistance with ADLs?: Yes Does the patient have difficulty dressing or bathing?: No Independently performs ADLs?: Yes (appropriate for developmental age) Does the patient have difficulty walking or climbing stairs?: Yes Weakness of Legs: Left Weakness of Arms/Hands: Left  Permission Sought/Granted Permission sought to share information with : Case Manager, Family Supports, Other (comment)(HH Agency ) Permission granted to share information with : Yes, Verbal Permission Granted  Share Information with NAME: Stephanie Noble,Stephanie     Permission granted to share info w Relationship: Spouse   Permission granted to share info w Contact Information: [phone]  Emotional Assessment Appearance:: Appears stated age   Affect (typically observed): Accepting Orientation: : Oriented to Self, Oriented to Place, Oriented to  Time, Oriented to Situation Alcohol / Substance Use: Not Applicable Psych Involvement: No (comment)  Admission diagnosis:  left hip osteoarthritis Patient Active Problem List   Diagnosis Date Noted  . 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 PTSD (post-traumatic stress disorder) 04/13/2017  . Cognitive complaints 03/25/2017  . Hallux rigidus, left foot 09/15/2016  . Hallux rigidus, right foot 09/15/2016  . Pain in both feet 07/24/2016  . HNP (herniated nucleus pulposus), lumbar 04/16/2016  . Chronic neck pain 06/22/2015  . Arthritis 01/04/2013  . Insomnia 01/04/2013   PCP:  Merlyn AlbertLuking, William S, MD Pharmacy:   [facility] [phone][id number] - [place], [place] - [address] [facility] [address]  [place] [id number] Phone: [phone] Fax: [phone]  [facility] - [phone]  [facility] [address] [place] [address] Phone: [phone] Fax: [phone]  [facility] - [place], [place] - [address] [place] [address] Phone:  [phone] Fax: [phone]     Social Determinants of Health (SDOH) Interventions    Readmission Risk Interventions No flowsheet data found.

## 2018-11-10 NOTE — Progress Notes (Signed)
  Subjective: Patient stable - pain ok - no dizziness when up and about - doing well with PT   Objective: Vital signs in last 24 hours: Temp:  [96.8 F (36 C)-98.4 F (36.9 C)] 97.9 F (36.6 C) (05/13 1038) Pulse Rate:  [63-89] 71 (05/13 1038) Resp:  [14-16] 16 (05/13 1038) BP: (93-119)/(52-82) 98/57 (05/13 1038) SpO2:  [98 %-100 %] 100 % (05/13 1038) Weight:  [65.8 kg] 65.8 kg (05/12 1331)  Intake/Output from previous day: 05/12 0701 - 05/13 0700 In: 4470.6 [P.O.:480; I.V.:3025.8; IV Piggyback:964.8] Out: 4700 [Urine:4050; Blood:650] Intake/Output this shift: Total I/O In: 240 [P.O.:240] Out: 400 [Urine:400]  Exam:  Intact pulses distally Dorsiflexion/Plantar flexion intact  Labs: No results for input(s): HGB in the last 72 hours. No results for input(s): WBC, RBC, HCT, PLT in the last 72 hours. No results for input(s): NA, K, CL, CO2, BUN, CREATININE, GLUCOSE, CALCIUM in the last 72 hours. No results for input(s): LABPT, INR in the last 72 hours.  Assessment/Plan: Plan to dc today to home  - f/u 2 weeks   G Dorene Grebe 11/10/2018, 11:31 AM

## 2018-11-11 NOTE — Discharge Summary (Signed)
Physician Discharge Summary      Patient ID: Stephanie Noble MRN: [id number] DOB/AGE: 63/09/1955 63 y.o.  Admit date: 11/09/2018 Discharge date: 11/10/2018  Admission Diagnoses:  Active Problems:   Hip arthritis   Discharge Diagnoses:  Same  Surgeries: Procedure(s): LEFT TOTAL HIP ARTHROPLASTY DIRECT ANTERIOR APPROACH on 11/09/2018   Consultants:   Discharged Condition: Stable  Hospital Course: Stephanie CostainJennifer L Noble is an 63 y.o. female who was admitted 11/09/2018 with a chief complaint of left hip pain, and found to have a diagnosis of left hip arthritis.  They were brought to the operating room on 11/09/2018 and underwent the above named procedures.  She tolerated the procedure well.  She was transferred to recovery room and then her room in good condition.  She did very well with physical therapy on postop day #1.  She has excellent support at home.  She is discharged to home in good condition with discharge medications of Percocet as well as 1 baby aspirin twice a day and muscle relaxer.  Home health physical therapy for 3 visits also requested.  I will see her back in about 2 weeks for clinical recheck.  Antibiotics given:  Anti-infectives (From admission, onward)   Start     Dose/Rate Route Frequency Ordered Stop   11/09/18 1400  ceFAZolin (ANCEF) IVPB 2g/100 mL premix     2 g 200 mL/hr over 30 Minutes Intravenous Every 6 hours 11/09/18 1232 11/09/18 2245   11/09/18 0600  ceFAZolin (ANCEF) IVPB 2g/100 mL premix     2 g 200 mL/hr over 30 Minutes Intravenous On call to O.R. 11/09/18 [id number] 11/09/18 0742    .  Recent vital signs:  Vitals:   11/10/18 1435 11/10/18 1446  BP:  99/65  Pulse:  68  Resp:  16  Temp:  98.1 F (36.7 C)  SpO2: 94% 100%    Recent laboratory studies:  Results for orders placed or performed during the hospital encounter of 11/05/18  Novel Coronavirus, NAA (hospital order; send-out to ref lab)  Result Value Ref Range   SARS-CoV-2, NAA NOT DETECTED  NOT DETECTED   Coronavirus Source NASOPHARYNGEAL     Discharge Medications:   Allergies as of 11/10/2018      Reactions   Vistaril [hydroxyzine Hcl] Other (See Comments)   May have caused cardiac arrest       Medication List    STOP taking these medications   HYDROcodone-acetaminophen 5-325 MG tablet Commonly known as:  NORCO/VICODIN   traZODone 50 MG tablet Commonly known as:  DESYREL     TAKE these medications   albuterol 108 (90 Base) MCG/ACT inhaler Commonly known as:  VENTOLIN HFA Inhale 2 puffs into the lungs every 6 (six) hours as needed for wheezing or shortness of breath.   albuterol (2.5 MG/3ML) 0.083% nebulizer solution Commonly known as:  PROVENTIL Take 3 mLs (2.5 mg total) by nebulization every 4 (four) hours as needed for wheezing or shortness of breath.   aspirin 81 MG chewable tablet Chew 1 tablet (81 mg total) by mouth 2 (two) times daily.   celecoxib 200 MG capsule Commonly known as:  CELEBREX Take 1 capsule (200 mg total) by mouth daily.   DULoxetine 20 MG capsule Commonly known as:  CYMBALTA Take 20 mg by mouth daily.   gabapentin 300 MG capsule Commonly known as:  NEURONTIN Take one qam, one every noon, and 3 qhs What changed:    how much to take  how to take this  when to take this  additional instructions   methocarbamol 500 MG tablet Commonly known as:  ROBAXIN Take 1 tablet (500 mg total) by mouth every 6 (six) hours as needed for muscle spasms. What changed:    medication strength  how much to take  how to take this  when to take this  reasons to take this  additional instructions   oxyCODONE 5 MG immediate release tablet Commonly known as:  Oxy IR/ROXICODONE Take 1-2 tablets (5-10 mg total) by mouth every 4 (four) hours as needed for moderate pain (pain score 4-6).   zolpidem 10 MG tablet Commonly known as:  AMBIEN TAKE 1 TABLET BY MOUTH AT BEDTIME AS NEEDED What changed:  when to take this       Diagnostic  Studies: Dg C-arm 1-60 Min-no Report  Result Date: 11/09/2018 Fluoroscopy was utilized by the requesting physician.  No radiographic interpretation.   Dg Hip Port Unilat With Pelvis 1v Left  Result Date: 11/09/2018 CLINICAL DATA:  63 year old female with a history of left hip replacement EXAM: DG HIP (WITH OR WITHOUT PELVIS) 1V PORT LEFT COMPARISON:  Fluoroscopic study 11/09/2018, plain film 08/25/2010 FINDINGS: Surgical changes of left hip arthroplasty with lucency in the soft tissues. No fracture. Visualized bony pelvic ring intact. Degenerative changes of the right hip. Surgical dressing material overlies the soft tissues of the left hip. Left hip components appear congruent. IMPRESSION: Early surgical changes of left hip arthroplasty with no complicating features. Electronically Signed   By: Gilmer Mor D.O.   On: 11/09/2018 12:32   Dg Hip Operative Unilat With Pelvis Left  Result Date: 11/09/2018 CLINICAL DATA:  Left hip arthroplasty. EXAM: OPERATIVE LEFT HIP (WITH PELVIS IF PERFORMED) TECHNIQUE: Fluoroscopic spot image(s) were submitted for interpretation post-operatively. FLUOROSCOPY TIME:  26 seconds. COMPARISON:  Left hip x-rays dated August 25, 2018. FINDINGS: Intraoperative fluoroscopic images demonstrate interval left total hip arthroplasty. Components are well aligned. No acute osseous abnormality. IMPRESSION: Intraoperative fluoroscopic guidance for left hip total arthroplasty. Electronically Signed   By: Obie Dredge M.D.   On: 11/09/2018 10:38    Disposition:     Follow-up Information    [facility].   Specialty:  Home Health Services Why:  Representative will reach out to you to arrange physical therapy sessions.            Signed: Burnard Bunting 11/11/2018, 1:25 PM

## 2018-11-11 NOTE — Telephone Encounter (Signed)
Call and sched virt visit, then may ref times one

## 2018-11-15 NOTE — Telephone Encounter (Signed)
Tried calling Number busy. 

## 2018-11-17 ENCOUNTER — Inpatient Hospital Stay: Payer: BLUE CROSS/BLUE SHIELD | Admitting: Orthopedic Surgery

## 2018-11-18 NOTE — Telephone Encounter (Signed)
Telephone call-voicemail not set up ?

## 2018-11-18 NOTE — Telephone Encounter (Signed)
Please call and schedule pt and route message back to clinical pool so we can send in med after appt is made   

## 2018-11-19 NOTE — Telephone Encounter (Signed)
Pt is scheduled 5/26

## 2018-11-23 ENCOUNTER — Other Ambulatory Visit: Payer: Self-pay

## 2018-11-23 ENCOUNTER — Ambulatory Visit (INDEPENDENT_AMBULATORY_CARE_PROVIDER_SITE_OTHER): Payer: BLUE CROSS/BLUE SHIELD | Admitting: Family Medicine

## 2018-11-23 ENCOUNTER — Encounter: Payer: Self-pay | Admitting: Family Medicine

## 2018-11-23 DIAGNOSIS — F5101 Primary insomnia: Secondary | ICD-10-CM | POA: Diagnosis not present

## 2018-11-23 DIAGNOSIS — M79641 Pain in right hand: Secondary | ICD-10-CM | POA: Diagnosis not present

## 2018-11-23 DIAGNOSIS — F4310 Post-traumatic stress disorder, unspecified: Secondary | ICD-10-CM

## 2018-11-23 DIAGNOSIS — M79642 Pain in left hand: Secondary | ICD-10-CM | POA: Diagnosis not present

## 2018-11-23 MED ORDER — GABAPENTIN 300 MG PO CAPS: 300.0000 mg | ORAL_CAPSULE | ORAL | 0 refills | Status: DC

## 2018-11-23 NOTE — Progress Notes (Signed)
   Subjective:    Patient ID: Stephanie Noble, female    DOB: 09-Aug-1955, 63 y.o.   MRN: [id number] Audio only HPI  Patient calls for a med check. Patient states she needs refills of her gabapentin. Patient states she had hip replacement 2 weeks ago and is doing well and up and walking.  Virtual Visit via Video Note  I connected with Stephanie Noble on 11/23/18 at  1:10 PM EDT by a video enabled telemedicine application and verified that I am speaking with the correct person using two identifiers.  Location: Patient: home Provider: office  I discussed the limitations of evaluation and management by telemedicine and the availability of in person appointments. The patient expressed understanding and agreed to proceed.  History of Present Illness:    Observations/Objective:   Assessment and Plan:   Follow Up Instructions:    I discussed the assessment and treatment plan with the patient. The patient was provided an opportunity to ask questions and all were answered. The patient agreed with the plan and demonstrated an understanding of the instructions.   The patient was advised to call back or seek an in-person evaluation if the symptoms worsen or if the condition fails to improve as anticipated.  I provided of non-face-to-face time during this encounter.  Gabapentin time for ref, pt maintains  Patient continues to maintain gabapentin for chronic neuropathic pain.  Overall definitely helps her.  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  Helped considerably by Neurontin  to maintain  2.  Insomnia/anxiety/PTSD improving  Meds refilled diet exercise discussed follow-up in 6 months

## 2018-11-24 ENCOUNTER — Encounter: Payer: Self-pay | Admitting: Orthopedic Surgery

## 2018-11-24 ENCOUNTER — Ambulatory Visit (INDEPENDENT_AMBULATORY_CARE_PROVIDER_SITE_OTHER): Payer: BLUE CROSS/BLUE SHIELD | Admitting: Orthopedic Surgery

## 2018-11-24 ENCOUNTER — Other Ambulatory Visit: Payer: Self-pay | Admitting: Family Medicine

## 2018-11-24 ENCOUNTER — Ambulatory Visit (INDEPENDENT_AMBULATORY_CARE_PROVIDER_SITE_OTHER): Payer: BLUE CROSS/BLUE SHIELD

## 2018-11-24 VITALS — Ht 66.0 in | Wt 140.0 lb

## 2018-11-24 DIAGNOSIS — M1610 Unilateral primary osteoarthritis, unspecified hip: Secondary | ICD-10-CM

## 2018-11-24 DIAGNOSIS — M1612 Unilateral primary osteoarthritis, left hip: Secondary | ICD-10-CM | POA: Diagnosis not present

## 2018-11-24 NOTE — Progress Notes (Signed)
Post-Op Visit Note   Patient: Stephanie CostainJennifer L Noble           Date of Birth: 07/28/1955           MRN: [id number] Visit Date: 11/24/2018 PCP: Merlyn AlbertLuking, William S, MD   Assessment & Plan:  Chief Complaint:  Chief Complaint  Patient presents with  . Left Hip - Routine Post Op    11/09/2018 Left THA   Visit Diagnoses:  1. Hip arthritis     Plan: Stephanie DikeJennifer is a 63 year old patient is now 2 weeks out left total hip replacement.  On exam she is walking with no assistive devices.  Has good hip flexion strength.  Incision is intact.  X-rays look good.  Slightly more anteversion of the cup to compensate for her flat back deformity.  Leg lengths approximately equal both clinically and radiographically.  Plan at this time is to continue with her 90 degrees hip flexion precautions.  Follow-up with me in 4 weeks.  She did not have any home health physical therapy but she is doing well enough on her own that I do not think she needs any.  Follow-Up Instructions: Return in about 4 weeks (around 12/22/2018).   Orders:  Orders Placed This Encounter  Procedures  . XR HIP UNILAT W OR W/O PELVIS 2-3 VIEWS LEFT   No orders of the defined types were placed in this encounter.   Imaging: Xr Hip Unilat W Or W/o Pelvis 2-3 Views Left  Result Date: 11/24/2018 AP pelvis lateral left hip reviewed.  Total hip prosthesis in good position alignment.  No complicating features.  Leg lengths approximately equal.   PMFS History: Patient Active Problem List   Diagnosis Date Noted  . 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 PTSD (post-traumatic stress disorder) 04/13/2017  . Cognitive complaints 03/25/2017  . Hallux rigidus, left foot 09/15/2016  . Hallux rigidus, right foot 09/15/2016  . Pain in both feet 07/24/2016  . HNP (herniated nucleus pulposus), lumbar 04/16/2016  . Chronic neck pain 06/22/2015  . Arthritis 01/04/2013  . Insomnia 01/04/2013   Past Medical History:  Diagnosis Date  . Anxiety   . Asthma    uses  albuterol as needed "mostly weather induced".ALbuterol and Ventolin inhaler as needed  . Chronic back pain   . Chronic neck pain   . Complication of anesthesia   . Degenerative disc disease    cervical and lumbar area  . Depression   . History of bronchitis 2016  . Insomnia    takes Ambien nightly  . Muscle spasm    takes Robaxin daily   . Pneumonia    hx of-2016  . PONV (postoperative nausea and vomiting)   . Weakness    numbness and tingling in legs    Family History  Problem Relation Age of Onset  . Dementia Mother   . Other Sister   . Alcohol abuse Other     Past Surgical History:  Procedure Laterality Date  . BACK SURGERY     x 3 lumbar  . CHOLECYSTECTOMY    . DILATION AND CURETTAGE OF UTERUS     x 2  . KNEE SURGERY Left    x 2  . left knee arthroscopy      x 2  . POSTERIOR CERVICAL FUSION/FORAMINOTOMY  05/17/2012   Procedure: POSTERIOR CERVICAL FUSION/FORAMINOTOMY LEVEL 2;  Surgeon: Hewitt Shortsobert W Nudelman, MD;  Location: [facility];  Service: Neurosurgery;  Laterality: N/A;  cervical six to Thoracic One posterior cervical  arthrodesis with instrumentation  . SHOULDER SURGERY     right  . SPINE SURGERY     x 5  . TOE FUSION Left 09/2016  . TOTAL HIP ARTHROPLASTY Left 11/09/2018   Procedure: LEFT TOTAL HIP ARTHROPLASTY DIRECT ANTERIOR APPROACH;  Surgeon: Cammy Copa, MD;  Location: [facility];  Service: Orthopedics;  Laterality: Left;   Social History   Occupational History  . Not on file  Tobacco Use  . Smoking status: Never Smoker  . Smokeless tobacco: Never Used  Substance and Sexual Activity  . Alcohol use: No  . Drug use: No  . Sexual activity: Yes    Birth control/protection: Post-menopausal

## 2018-11-25 NOTE — Telephone Encounter (Signed)
Six mo worth 

## 2018-11-27 ENCOUNTER — Telehealth: Payer: Self-pay

## 2018-11-27 DIAGNOSIS — Z20822 Contact with and (suspected) exposure to covid-19: Secondary | ICD-10-CM

## 2018-11-27 NOTE — Telephone Encounter (Signed)
Pt asymptomatic for covid s/sx following potential covid exposure. Appt scheduled for 11/28/18 at 4pm [facility]

## 2018-11-28 ENCOUNTER — Other Ambulatory Visit: Payer: Self-pay

## 2018-11-29 LAB — NOVEL CORONAVIRUS, NAA: SARS-CoV-2, NAA: NOT DETECTED

## 2018-12-08 ENCOUNTER — Other Ambulatory Visit: Payer: Self-pay | Admitting: Orthopedic Surgery

## 2018-12-08 NOTE — Telephone Encounter (Signed)
Ok to rf? 

## 2018-12-08 NOTE — Telephone Encounter (Signed)
Y ok to stop after 2 more weeks

## 2018-12-29 ENCOUNTER — Ambulatory Visit: Payer: BLUE CROSS/BLUE SHIELD | Admitting: Orthopedic Surgery

## 2019-03-12 ENCOUNTER — Other Ambulatory Visit: Payer: Self-pay | Admitting: Family Medicine

## 2019-04-06 ENCOUNTER — Other Ambulatory Visit (INDEPENDENT_AMBULATORY_CARE_PROVIDER_SITE_OTHER): Payer: BC Managed Care – PPO | Admitting: *Deleted

## 2019-04-06 ENCOUNTER — Other Ambulatory Visit: Payer: Self-pay

## 2019-04-06 DIAGNOSIS — Z23 Encounter for immunization: Secondary | ICD-10-CM

## 2019-05-04 ENCOUNTER — Other Ambulatory Visit: Payer: Self-pay | Admitting: Family Medicine

## 2019-05-04 NOTE — Telephone Encounter (Signed)
Called pt since this med was discontinued and she states she has been on this med since 2017 and at her last visit with dr Richardson Landry on 11/23/18 he told her he would take over gabapentin and robaxin because she was having a hard time getting in with pain clinic

## 2019-05-06 ENCOUNTER — Telehealth: Payer: Self-pay | Admitting: Family Medicine

## 2019-05-06 NOTE — Telephone Encounter (Signed)
Pharmacy requesting refill on Albuterol Nebulizer Solution. Pt last seen 11/23/2018 for hand pain. Please advise. Thank you

## 2019-05-09 ENCOUNTER — Other Ambulatory Visit: Payer: Self-pay | Admitting: *Deleted

## 2019-05-09 MED ORDER — ALBUTEROL SULFATE (2.5 MG/3ML) 0.083% IN NEBU: 2.5000 mg | INHALATION_SOLUTION | RESPIRATORY_TRACT | 5 refills | Status: AC | PRN

## 2019-05-09 NOTE — Telephone Encounter (Signed)
Sure 6 ref

## 2019-05-09 NOTE — Telephone Encounter (Signed)
Refills sent

## 2019-05-19 ENCOUNTER — Encounter: Payer: Self-pay | Admitting: Family Medicine

## 2019-05-19 ENCOUNTER — Ambulatory Visit (INDEPENDENT_AMBULATORY_CARE_PROVIDER_SITE_OTHER): Payer: BC Managed Care – PPO | Admitting: Family Medicine

## 2019-05-19 ENCOUNTER — Other Ambulatory Visit: Payer: Self-pay

## 2019-05-19 DIAGNOSIS — J4541 Moderate persistent asthma with (acute) exacerbation: Secondary | ICD-10-CM | POA: Diagnosis not present

## 2019-05-19 MED ORDER — ALBUTEROL SULFATE HFA 108 (90 BASE) MCG/ACT IN AERS: INHALATION_SPRAY | RESPIRATORY_TRACT | 0 refills | Status: AC

## 2019-05-19 MED ORDER — FLOVENT HFA 44 MCG/ACT IN AERO: INHALATION_SPRAY | RESPIRATORY_TRACT | 5 refills | Status: DC

## 2019-05-19 MED ORDER — PREDNISONE 10 MG PO TABS: ORAL_TABLET | ORAL | 0 refills | Status: DC
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
## 2019-05-27 ENCOUNTER — Other Ambulatory Visit: Payer: Self-pay | Admitting: Family Medicine

## 2019-05-27 NOTE — Telephone Encounter (Signed)
Six mo worth 

## 2019-06-11 ENCOUNTER — Other Ambulatory Visit: Payer: Self-pay | Admitting: Family Medicine

## 2019-06-13 ENCOUNTER — Telehealth: Payer: Self-pay | Admitting: Family Medicine

## 2019-06-13 MED ORDER — GABAPENTIN 300 MG PO CAPS: 300.0000 mg | ORAL_CAPSULE | ORAL | 1 refills | Status: DC

## 2019-06-13 NOTE — Telephone Encounter (Signed)
Ok six mo worth 

## 2019-06-13 NOTE — Telephone Encounter (Signed)
Patient is requesting refill on gabapentine 300 mg used the last pill on 12/11. Last seen 05/19/19. Last filled 11/23/2018. [facility]

## 2019-06-13 NOTE — Telephone Encounter (Signed)
Medication sent [facility]. Pt contacted and verbalized understanding.

## 2019-06-13 NOTE — Telephone Encounter (Signed)
Please advise. Thank you

## 2019-07-10 ENCOUNTER — Other Ambulatory Visit: Payer: Self-pay | Admitting: Family Medicine

## 2019-08-04 ENCOUNTER — Encounter: Payer: Self-pay | Admitting: Family Medicine

## 2019-08-25 ENCOUNTER — Other Ambulatory Visit: Payer: Self-pay | Admitting: Family Medicine

## 2019-08-31 ENCOUNTER — Telehealth: Payer: Self-pay | Admitting: Family Medicine

## 2019-08-31 MED ORDER — ZOLPIDEM TARTRATE 10 MG PO TABS: 10.0000 mg | ORAL_TABLET | Freq: Every day | ORAL | 1 refills | Status: DC

## 2019-08-31 NOTE — Telephone Encounter (Signed)
6 mo 

## 2019-08-31 NOTE — Telephone Encounter (Signed)
Patient notified

## 2019-08-31 NOTE — Telephone Encounter (Signed)
Patient is requesting a refill on Ambien 10 mg called [facility]. Patient last seen 05/19/2019 and last filled 05/27/2019. She has been completely out for a week.

## 2019-08-31 NOTE — Telephone Encounter (Signed)
Script printed and will fax after dr Brett Canales signs and then call pt

## 2019-09-01 ENCOUNTER — Other Ambulatory Visit: Payer: Self-pay | Admitting: Family Medicine

## 2019-09-01 NOTE — Telephone Encounter (Signed)
Left message to make appointment 

## 2019-09-01 NOTE — Telephone Encounter (Signed)
Ok times one, needs chronic ck up this spring( I also noted that on gabapentin ref in case another nurse already working on that)

## 2019-09-01 NOTE — Telephone Encounter (Signed)
Please schedule and then route back.  

## 2019-09-01 NOTE — Telephone Encounter (Signed)
Ok times one rec chronic o v this spring

## 2019-09-01 NOTE — Telephone Encounter (Signed)
Last med check up 11/23/18 

## 2019-09-04 ENCOUNTER — Other Ambulatory Visit: Payer: Self-pay | Admitting: Family Medicine

## 2019-09-05 NOTE — Telephone Encounter (Signed)
Last med check up 11/23/18

## 2019-09-05 NOTE — Telephone Encounter (Signed)
Scheduled 4/20

## 2019-09-05 NOTE — Telephone Encounter (Signed)
Pt has ov in April may ref times one

## 2019-09-12 ENCOUNTER — Ambulatory Visit: Payer: Self-pay

## 2019-09-12 ENCOUNTER — Other Ambulatory Visit: Payer: Self-pay

## 2019-09-12 ENCOUNTER — Encounter: Payer: Self-pay | Admitting: Orthopedic Surgery

## 2019-09-12 ENCOUNTER — Ambulatory Visit: Payer: BC Managed Care – PPO | Admitting: Orthopedic Surgery

## 2019-09-12 DIAGNOSIS — M19012 Primary osteoarthritis, left shoulder: Secondary | ICD-10-CM | POA: Diagnosis not present

## 2019-09-12 DIAGNOSIS — M79602 Pain in left arm: Secondary | ICD-10-CM

## 2019-09-12 MED ORDER — METHYLPREDNISOLONE ACETATE 40 MG/ML IJ SUSP
13.3300 mg | INTRAMUSCULAR | Status: AC | PRN
  Administered 2019-09-12: 13.33 mg via INTRA_ARTICULAR

## 2019-09-12 MED ORDER — BUPIVACAINE HCL 0.25 % IJ SOLN
0.6600 mL | INTRAMUSCULAR | Status: AC | PRN
  Administered 2019-09-12: 23:00:00 .66 mL via INTRA_ARTICULAR

## 2019-09-12 MED ORDER — LIDOCAINE HCL 1 % IJ SOLN
3.0000 mL | INTRAMUSCULAR | Status: AC | PRN
  Administered 2019-09-12: 3 mL

## 2019-09-12 NOTE — Progress Notes (Signed)
Office Visit Note   Patient: Stephanie Noble           Date of Birth: 1955-11-30           MRN: [id number] Visit Date: 09/12/2019 Requested by: Mikey Kirschner, [address],  [place] [address] PCP: Mikey Kirschner, MD  Subjective: Chief Complaint  Patient presents with  . Left Shoulder - Pain  . Neck - Pain    HPI: Stephanie Noble is a 64 year old patient with left shoulder pain.  She has had right shoulder arthroscopy and distal clavicle excision and did well with that.  She is reporting some symptoms of radiculopathy in that left arm with numbness and tingling as well as superior pain with pain which interferes with bra strap use.  Reports some neck pain.  She has had previous cervical spine surgery x2 with Dr. Lise Auer.  Last one was in 2014.  She is had a superior not in that left shoulder AC joint for years.  She has describes pain radiating from the elbow to the top of her hand as well as the palmar surface of her hand.  Notably from a social perspective her mother and father just passed away within the last 6 months.  She takes Aleve for her symptoms.  She is doing well with her hip replacement.             ROS: All systems reviewed are negative as they relate to the chief complaint within the history of present illness.  Patient denies  fevers or chills.   Assessment & Plan: Visit Diagnoses:  1. Left arm pain     Plan: Impression is left shoulder pain with AC joint arthritis and likely a component of radiculopathy.  Best option for Stephanie Noble would be diagnostic and therapeutic AC joint injection under ultrasound today.  We will see how much pain relief she gets from that.  If it has great pain relief but not sustained we could consider arthroscopic distal clavicle excision.  She is not too keen on further surgery based on how much surgery she has had as well as her social situation.  Nonetheless I think if she does not get any relief from this injection over the  next 2 hours over the next 2 to 3 days then patient should consider evaluation of her neck.  She will call me if she wants to get another injection or get scheduled for arthroscopic distal clavicle excision.  Follow-Up Instructions: Return if symptoms worsen or fail to improve.   Orders:  Orders Placed This Encounter  Procedures  . XR Shoulder Left  . XR Cervical Spine 2 or 3 views   No orders of the defined types were placed in this encounter.     Procedures: Medium Joint Inj: L acromioclavicular on 09/12/2019 11:00 PM Indications: diagnostic evaluation and pain Details: 25 G 1.5 in needle, ultrasound-guided superior approach Medications: 3 mL lidocaine 1 %; 0.66 mL bupivacaine 0.25 %; 13.33 mg methylPREDNISolone acetate 40 MG/ML Outcome: tolerated well, no immediate complications Procedure, treatment alternatives, risks and benefits explained, specific risks discussed. Consent was given by the patient. Immediately prior to procedure a time out was called to verify the correct patient, procedure, equipment, support staff and site/side marked as required. Patient was prepped and draped in the usual sterile fashion.       Clinical Data: No additional findings.  Objective: Vital Signs: There were no vitals taken for this visit.  Physical Exam:  Constitutional: Patient appears well-developed HEENT:  Head: Normocephalic Eyes:EOM are normal Neck: Normal range of motion Cardiovascular: Normal rate Pulmonary/chest: Effort normal Neurologic: Patient is alert Skin: Skin is warm Psychiatric: Patient has normal mood and affect    Ortho Exam: Ortho exam demonstrates full active and passive range of motion of the left and right shoulder.  She has tenderness to palpation of the [facility] joint as well as a prominent spur superiorly.  Rotator cuff strength on the left intact infraspinatus supraspinatus and subscap muscle testing.  No masses lymphadenopathy or skin changes noted in the  shoulder girdle region.  Does have paresthesias in the C6 distribution on the left-hand side but no muscle atrophy and good strength to EPL FPL interosseous wrist flexion extension bicep triceps and deltoid testing.  Specialty Comments:  No specialty comments available.  Imaging: XR Cervical Spine 2 or 3 views  Result Date: 09/12/2019 AP lateral cervical spine reviewed.  Fusion anterior and posterior from C4-C7 is present.  No obvious complicating features.  No significant adjacent segment disease above the level of fusion.  There is some sclerosis in the vertebral body below the level of fusion anteriorly.  XR Shoulder Left  Result Date: 09/12/2019 AP outlet and axillary left shoulder reviewed.  No glenohumeral joint arthritis is present.  There is some superior spurring at the [facility] joint.  Shoulder is located.  Acromiohumeral distance intact.  Visualized lung fields clear.    PMFS History: Patient Active Problem List   Diagnosis Date Noted  . 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 PTSD (post-traumatic stress disorder) 04/13/2017  . Cognitive complaints 03/25/2017  . Hallux rigidus, left foot 09/15/2016  . Hallux rigidus, right foot 09/15/2016  . Pain in both feet 07/24/2016  . HNP (herniated nucleus pulposus), lumbar 04/16/2016  . Chronic neck pain 06/22/2015  . Arthritis 01/04/2013  . Insomnia 01/04/2013   Past Medical History:  Diagnosis Date  . Anxiety   . Asthma    uses albuterol as needed "mostly weather induced".ALbuterol and Ventolin inhaler as needed  . Chronic back pain   . Chronic neck pain   . Complication of anesthesia   . Degenerative disc disease    cervical and lumbar area  . Depression   . History of bronchitis 2016  . Insomnia    takes Ambien nightly  . Muscle spasm    takes Robaxin daily   . Pneumonia    hx of-2016  . PONV (postoperative nausea and vomiting)   . Weakness    numbness and tingling in legs    Family History  Problem Relation Age of Onset  .  Dementia Mother   . Other Sister   . Alcohol abuse Other     Past Surgical History:  Procedure Laterality Date  . BACK SURGERY     x 3 lumbar  . CHOLECYSTECTOMY    . DILATION AND CURETTAGE OF UTERUS     x 2  . KNEE SURGERY Left    x 2  . left knee arthroscopy      x 2  . POSTERIOR CERVICAL FUSION/FORAMINOTOMY  05/17/2012   Procedure: POSTERIOR CERVICAL FUSION/FORAMINOTOMY LEVEL 2;  Surgeon: Hewitt Shorts, MD;  Location: [facility];  Service: Neurosurgery;  Laterality: N/A;  cervical six to Thoracic One posterior cervical arthrodesis with instrumentation  . SHOULDER SURGERY     right  . SPINE SURGERY     x 5  . TOE FUSION Left 09/2016  . TOTAL HIP ARTHROPLASTY Left 11/09/2018  Procedure: LEFT TOTAL HIP ARTHROPLASTY DIRECT ANTERIOR APPROACH;  Surgeon: Cammy Copa, MD;  Location: [facility];  Service: Orthopedics;  Laterality: Left;   Social History   Occupational History  . Not on file  Tobacco Use  . Smoking status: Never Smoker  . Smokeless tobacco: Never Used  Substance and Sexual Activity  . Alcohol use: No  . Drug use: No  . Sexual activity: Yes    Birth control/protection: Post-menopausal

## 2019-09-23 ENCOUNTER — Ambulatory Visit: Payer: BC Managed Care – PPO | Attending: Internal Medicine

## 2019-09-23 ENCOUNTER — Ambulatory Visit: Payer: BC Managed Care – PPO

## 2019-09-23 DIAGNOSIS — Z23 Encounter for immunization: Secondary | ICD-10-CM

## 2019-09-23 NOTE — Progress Notes (Signed)
   Covid-19 Vaccination Clinic  Name:  AMRIE GURGANUS    MRN: [id number] DOB: [birth year]  09/23/2019  Ms. Maricle was observed post Covid-19 immunization for 15 minutes without incident. She was provided with Vaccine Information Sheet and instruction to access the V-Safe system.   Ms. Weesner was instructed to call 911 with any severe reactions post vaccine: Marland Kitchen Difficulty breathing  . Swelling of face and throat  . A fast heartbeat  . A bad rash all over body  . Dizziness and weakness   Immunizations Administered    Name Date Dose VIS Date Route   Moderna COVID-19 Vaccine 09/23/2019 10:50 AM 0.5 mL 05/31/2019 Intramuscular   Manufacturer: Moderna   Lot: [id number]   NDC: 86484-720-72

## 2019-09-27 DIAGNOSIS — J3489 Other specified disorders of nose and nasal sinuses: Secondary | ICD-10-CM | POA: Insufficient documentation

## 2019-10-10 ENCOUNTER — Other Ambulatory Visit: Payer: Self-pay

## 2019-10-17 ENCOUNTER — Telehealth: Payer: Self-pay | Admitting: Orthopedic Surgery

## 2019-10-17 NOTE — Telephone Encounter (Signed)
Pls advise. Thanks.  

## 2019-10-17 NOTE — Telephone Encounter (Signed)
Patient called asked if she can get something called in for pain until her surgery? [facility] [place] [facility] [address]. The number to contact patient is [phone]

## 2019-10-18 ENCOUNTER — Telehealth (INDEPENDENT_AMBULATORY_CARE_PROVIDER_SITE_OTHER): Payer: BC Managed Care – PPO | Admitting: Family Medicine

## 2019-10-18 ENCOUNTER — Telehealth: Payer: Self-pay | Admitting: *Deleted

## 2019-10-18 DIAGNOSIS — F5101 Primary insomnia: Secondary | ICD-10-CM | POA: Diagnosis not present

## 2019-10-18 DIAGNOSIS — F4310 Post-traumatic stress disorder, unspecified: Secondary | ICD-10-CM

## 2019-10-18 DIAGNOSIS — M79672 Pain in left foot: Secondary | ICD-10-CM

## 2019-10-18 DIAGNOSIS — M79671 Pain in right foot: Secondary | ICD-10-CM

## 2019-10-18 DIAGNOSIS — J4541 Moderate persistent asthma with (acute) exacerbation: Secondary | ICD-10-CM

## 2019-10-18 MED ORDER — DULOXETINE HCL 20 MG PO CPEP: 40.0000 mg | ORAL_CAPSULE | Freq: Every day | ORAL | 1 refills | Status: DC

## 2019-10-18 MED ORDER — CELECOXIB 200 MG PO CAPS: 200.0000 mg | ORAL_CAPSULE | Freq: Every day | ORAL | 5 refills | Status: DC

## 2019-10-18 MED ORDER — FLOVENT HFA 44 MCG/ACT IN AERO: INHALATION_SPRAY | RESPIRATORY_TRACT | 5 refills | Status: DC

## 2019-10-18 MED ORDER — ZOLPIDEM TARTRATE 10 MG PO TABS: 10.0000 mg | ORAL_TABLET | Freq: Every day | ORAL | 1 refills | Status: DC

## 2019-10-18 MED ORDER — GABAPENTIN 300 MG PO CAPS: ORAL_CAPSULE | ORAL | 5 refills | Status: DC

## 2019-10-18 MED ORDER — METHOCARBAMOL 750 MG PO TABS: ORAL_TABLET | ORAL | 5 refills | Status: DC

## 2019-10-18 NOTE — Telephone Encounter (Signed)
Stephanie Noble, Stephanie Noble are scheduled for a virtual visit with your provider today.    Just as we do with appointments in the office, we must obtain your consent to participate.  Your consent will be active for this visit and any virtual visit you may have with one of our providers in the next 365 days.    If you have a MyChart account, I can also send a copy of this consent to you electronically.  All virtual visits are billed to your insurance company just like a traditional visit in the office.  As this is a virtual visit, video technology does not allow for your provider to perform a traditional examination.  This may limit your provider's ability to fully assess your condition.  If your provider identifies any concerns that need to be evaluated in person or the need to arrange testing such as labs, EKG, etc, we will make arrangements to do so.    Although advances in technology are sophisticated, we cannot ensure that it will always work on either your end or our end.  If the connection with a video visit is poor, we may have to switch to a telephone visit.  With either a video or telephone visit, we are not always able to ensure that we have a secure connection.   I need to obtain your verbal consent now.   Are you willing to proceed with your visit today?   Stephanie Noble has provided verbal consent on 10/18/2019 for a virtual visit (video or telephone).   Kyra Manges, LPN 7/00/1749  4:49 AM

## 2019-10-18 NOTE — Progress Notes (Signed)
   Subjective:  Audio only  Patient ID: Stephanie Noble, female    DOB: 1955-07-06, 64 y.o.   MRN: [id number]  HPImed check up. Pt states no concerns today. Takes meds as prescribed and no problems.   Virtual Visit via Telephone Note  I connected with Stephanie Noble on 10/18/19 at  9:00 AM EDT by telephone and verified that I am speaking with the correct person using two identifiers.  Location: Patient: home Provider: office   I discussed the limitations, risks, security and privacy concerns of performing an evaluation and management service by telephone and the availability of in person appointments. I also discussed with the patient that there may be a patient responsible charge related to this service. The patient expressed understanding and agreed to proceed.   History of Present Illness:    Observations/Objective:   Assessment and Plan:   Follow Up Instructions:    I discussed the assessment and treatment plan with the patient. The patient was provided an opportunity to ask questions and all were answered. The patient agreed with the plan and demonstrated an understanding of the instructions.   The patient was advised to call back or seek an in-person evaluation if the symptoms worsen or if the condition fails to improve as anticipated.  I provided 30 minutes of non-face-to-face time during this encounter.  Patient compliant with the Cymbalta.  Helps her mood.  History of PTSD.  States overall it definitely helps  Compliant with gabapentin.  Has chronic neuropathy of the legs.  Also some neuropathic features emanating from cervical spinal history.  States during the morning Neurontin does not seem to be quite strong enough  Asthma overall stable.  Compliant with her steroid inhaler.  Rare use of albuterol  Due to have shoulder surgery shortly    Patient compliant with insomnia medication. Generally takes most nights. No obvious morning drowsiness. Definitely helps  patient sleep. Without it patient states would not get a good nights rest.m    Review of Systems No chest pain no shortness of breath no cough no abdominal pain    Objective:   Physical Exam  Virtual      Assessment & Plan:  Impression 1 chronic PTSD element.  Still good on Cymbalta to maintain  2.  Neuropathic pain suboptimal.  Will increase morning gabapentin dose  3.  Insomnia ropey control discussed maintain present meds  4.  Chronic persistent asthma.  Clinically stable on meds meds refilled  Follow-up in 6 months diet exercise discussed of note to have shoulder surgery shortly

## 2019-10-19 ENCOUNTER — Other Ambulatory Visit: Payer: Self-pay | Admitting: Surgical

## 2019-10-19 ENCOUNTER — Ambulatory Visit: Payer: BC Managed Care – PPO | Attending: Internal Medicine

## 2019-10-19 DIAGNOSIS — Z23 Encounter for immunization: Secondary | ICD-10-CM

## 2019-10-19 MED ORDER — TRAMADOL HCL 50 MG PO TABS: 50.0000 mg | ORAL_TABLET | Freq: Every day | ORAL | 0 refills | Status: DC | PRN

## 2019-10-19 NOTE — Telephone Encounter (Signed)
IC advised submitted.  

## 2019-10-19 NOTE — Progress Notes (Signed)
   Covid-19 Vaccination Clinic  Name:  Stephanie Noble    MRN: [id number] DOB: 16-Oct-1955  10/19/2019  [name] was observed post Covid-19 immunization for 15 minutes without incident. She was provided with Vaccine Information Sheet and instruction to access the V-Safe system.   [name] was instructed to call 911 with any severe reactions post vaccine: Marland Kitchen Difficulty breathing  . Swelling of face and throat  . A fast heartbeat  . A bad rash all over body  . Dizziness and weakness   Immunizations Administered    Name Date Dose VIS Date Route   Moderna COVID-19 Vaccine 10/19/2019 11:11 AM 0.5 mL 05/2019 Intramuscular   Manufacturer: Moderna   Lot: [id number]   NDC: 88280-034-91

## 2019-10-21 NOTE — Progress Notes (Signed)
[id number] [place] [place] [facility] [place] [address] [place] [address] Phone: [phone] Fax: [phone]  [facility] [place], [place] [address] Phone: [phone] Fax: [phone] - [place], [place] [place] [address] Phone: [phone] Fax: [phone]      Your procedure is scheduled on Thursday, October 27, 2019.  Report to [facility] Entrance "A" at 12:45 P.M., and check in at the Admitting office.  Call this number if you have problems the morning of surgery:  [phone]  Call [phone] if you have any questions prior to your surgery date Monday-Friday 8am-4pm    Remember:  Do not eat after midnight the night before your surgery  You may drink clear liquids until 11:45 AM the morning of your surgery.   Clear liquids allowed are: Water, Non-Citrus Juices (without pulp), Carbonated Beverages, Clear Tea, Black Coffee Only, and Gatorade   Enhanced Recovery after Surgery for Orthopedics Enhanced Recovery after Surgery is a protocol used to improve the stress on your body and your recovery after surgery.  Patient Instructions  . The night before surgery:  o No food after midnight. ONLY clear liquids after midnight  .  Marland Kitchen The day of surgery (if you do NOT have diabetes):  o Drink ONE (1) Pre-Surgery Clear Ensure as directed.   o This drink was given to you during your hospital  pre-op appointment visit. o The pre-op nurse will instruct you on the time to drink the  Pre-Surgery Ensure depending on your surgery time. o Finish the drink by 11:45 AM. o Nothing else to drink after completing the  Pre-Surgery Clear Ensure.     Take these medicines the morning of surgery with A SIP OF WATER:  celecoxib (CELEBREX)  DULoxetine (CYMBALTA) fluticasone (FLOVENT HFA) gabapentin (NEURONTIN)       If needed: albuterol (PROVENTIL) albuterol (VENTOLIN HFA)  methocarbamol (ROBAXIN) traMADol (ULTRAM)   Please bring all inhalers with you the day of surgery.  As of today, STOP taking any Aspirin (unless otherwise instructed by your surgeon) and Aspirin containing products, Aleve, Naproxen, Ibuprofen, Motrin, Advil, Goody's, BC's, all herbal medications, fish oil, and all vitamins.                      Do not wear jewelry, make up, or nail polish            Do not wear lotions, powders, perfumes, or deodorant.            Do not shave 48 hours prior to surgery.            Do not bring valuables to the hospital.            [facility] is not responsible for any belongings or valuables.  Do NOT Smoke (Tobacco/Vapping) or drink Alcohol 24 hours prior to your procedure If you use a CPAP at night, you may bring all equipment for your overnight stay.   Contacts, glasses, dentures or bridgework may not be worn into surgery.      For patients admitted to the hospital, discharge time will be determined by your treatment team.   Patients discharged the day of surgery will not be allowed to drive home, and someone needs to stay with them for 24 hours.    Special instructions:   [facility]- Preparing For Surgery  Before surgery, you can play an important role. Because skin is not sterile, your skin needs to be as free of germs as possible. You can reduce the number of germs on your skin by washing with CHG (chlorahexidine gluconate) Soap before surgery.  CHG is an antiseptic cleaner which kills germs and bonds with the skin to continue killing germs even after washing.    Oral Hygiene is also important to reduce your risk of infection.  Remember - BRUSH YOUR TEETH THE MORNING OF SURGERY WITH YOUR REGULAR TOOTHPASTE  Please do not use if you have an allergy to CHG or antibacterial soaps. If your skin becomes reddened/irritated stop using the CHG.  Do not shave (including legs and underarms)  for at least 48 hours prior to first CHG shower. It is OK to shave your face.  Please follow these instructions carefully.   1. Shower the NIGHT BEFORE SURGERY and the MORNING OF SURGERY with CHG Soap.   2. If you chose to wash your hair, wash your hair first as usual with your normal shampoo.  3. After you shampoo, rinse your hair and body thoroughly to remove the shampoo.  4. Use CHG as you would any other liquid soap. You can apply CHG directly to the skin and wash gently with a scrungie or a clean washcloth.   5. Apply the CHG Soap to your body ONLY FROM THE NECK DOWN.  Do not use on open wounds or open sores. Avoid contact with your eyes, ears, mouth and genitals (private parts). Wash Face and genitals (private parts)  with your normal soap.   6. Wash thoroughly, paying special attention to the area where your surgery will be performed.  7. Thoroughly rinse your body with warm water from the neck down.  8. DO NOT shower/wash with your normal soap after using and rinsing off the CHG Soap.  9. Pat yourself dry with a CLEAN TOWEL.  10. Wear CLEAN PAJAMAS to bed the night before surgery, wear comfortable clothes the morning of surgery  11. Place CLEAN SHEETS on your bed the night of your first shower and DO NOT SLEEP WITH PETS.   Day of Surgery:   Do not apply any deodorants/lotions.  Please wear clean clothes to the hospital/surgery center.   Remember to brush your teeth WITH YOUR REGULAR TOOTHPASTE.   Please read over the following fact sheets that you were given.

## 2019-10-24 ENCOUNTER — Other Ambulatory Visit: Payer: Self-pay

## 2019-10-24 ENCOUNTER — Encounter (HOSPITAL_COMMUNITY)
Admission: RE | Admit: 2019-10-24 | Discharge: 2019-10-24 | Disposition: A | Discharge status: Home / Self Care | Payer: BC Managed Care – PPO | Source: Ambulatory Visit | Attending: Orthopedic Surgery | Admitting: Orthopedic Surgery

## 2019-10-24 ENCOUNTER — Other Ambulatory Visit (HOSPITAL_COMMUNITY)
Admission: RE | Admit: 2019-10-24 | Discharge: 2019-10-24 | Disposition: A | Discharge status: Home / Self Care | Payer: BC Managed Care – PPO | Source: Ambulatory Visit | Attending: Orthopedic Surgery | Admitting: Orthopedic Surgery

## 2019-10-24 ENCOUNTER — Encounter (HOSPITAL_COMMUNITY): Payer: Self-pay

## 2019-10-24 DIAGNOSIS — F329 Major depressive disorder, single episode, unspecified: Secondary | ICD-10-CM | POA: Diagnosis not present

## 2019-10-24 DIAGNOSIS — J45909 Unspecified asthma, uncomplicated: Secondary | ICD-10-CM | POA: Insufficient documentation

## 2019-10-24 DIAGNOSIS — F419 Anxiety disorder, unspecified: Secondary | ICD-10-CM | POA: Diagnosis not present

## 2019-10-24 DIAGNOSIS — M19012 Primary osteoarthritis, left shoulder: Secondary | ICD-10-CM | POA: Diagnosis not present

## 2019-10-24 DIAGNOSIS — Z981 Arthrodesis status: Secondary | ICD-10-CM | POA: Diagnosis not present

## 2019-10-24 DIAGNOSIS — Z20822 Contact with and (suspected) exposure to covid-19: Secondary | ICD-10-CM | POA: Diagnosis not present

## 2019-10-24 DIAGNOSIS — Z96642 Presence of left artificial hip joint: Secondary | ICD-10-CM | POA: Diagnosis not present

## 2019-10-24 DIAGNOSIS — G4700 Insomnia, unspecified: Secondary | ICD-10-CM | POA: Insufficient documentation

## 2019-10-24 DIAGNOSIS — Z01818 Encounter for other preprocedural examination: Secondary | ICD-10-CM | POA: Insufficient documentation

## 2019-10-24 DIAGNOSIS — Z79899 Other long term (current) drug therapy: Secondary | ICD-10-CM | POA: Insufficient documentation

## 2019-10-24 LAB — BASIC METABOLIC PANEL
Anion gap: 8 (ref 5–15)
BUN: 9 mg/dL (ref 8–23)
CO2: 32 mmol/L (ref 22–32)
Calcium: 9.6 mg/dL (ref 8.9–10.3)
Chloride: 102 mmol/L (ref 98–111)
Creatinine, Ser: 0.77 mg/dL (ref 0.44–1.00)
GFR calc Af Amer: >60 mL/min (ref >60)
GFR calc non Af Amer: >60 mL/min (ref >60)
Glucose, Bld: 60 mg/dL — ABNORMAL LOW (ref 70–99)
Potassium: 4 mmol/L (ref 3.5–5.1)
Sodium: 142 mmol/L (ref 135–145)

## 2019-10-24 LAB — CBC
HCT: 43.2 % (ref 36.0–46.0)
Hemoglobin: 14.5 g/dL (ref 12.0–15.0)
MCH: 30.9 pg (ref 26.0–34.0)
MCHC: 33.6 g/dL (ref 30.0–36.0)
MCV: 91.9 fL (ref 80.0–100.0)
Platelets: 222 10*3/uL (ref 150–400)
RBC: 4.7 MIL/uL (ref 3.87–5.11)
RDW: 12.4 % (ref 11.5–15.5)
WBC: 3.6 10*3/uL — ABNORMAL LOW (ref 4.0–10.5)
nRBC: 0 % (ref 0.0–0.2)

## 2019-10-24 LAB — SARS CORONAVIRUS 2 (TAT 6-24 HRS): SARS Coronavirus 2: NEGATIVE

## 2019-10-24 NOTE — Progress Notes (Signed)
PCP - Dr. Gerda Diss Cardiologist - denies  Chest x-ray - N/A EKG - 11/05/18 Stress Test- denies  ECHO - 04/19/16 Cardiac Cath - denies  Sleep Study - denies  Aspirin Instructions: Patient instructed to hold all Aspirin, NSAID's, herbal medications, fish oil and vitamins 7 days prior to surgery.   ERAS Protcol - clear liquids until 11:45 am DOS PRE-SURGERY Ensure or G2- Ensure given with instructions  COVID TEST- 10/24/19 at [facility]. Pt instructed to remain in their car. Educated on self quarantine until DOS   Anesthesia review: yes- patient states following her spinal surgery in 10/24/2015 she coded- believed to be reaction to anesthesia.  Patient denies shortness of breath, fever, cough and chest pain at PAT appointment   All instructions explained to the patient, with a verbal understanding of the material. Patient agrees to go over the instructions while at home for a better understanding. Patient also instructed to self quarantine after being tested for COVID-19. The opportunity to ask questions was provided.

## 2019-10-24 NOTE — Progress Notes (Signed)
[id number] - [place], [place] - [address] AT [facility] [address] [place] [address] Phone: [phone] Fax: [phone]  [facility] [place] - [place] - [address] [place] [address] Phone: [phone] Fax: [phone]  [facility] - [place], [place] - [address] [place] [address] Phone: [phone] Fax: [phone]      Your procedure is scheduled on Thursday, October 27, 2019.  Report to [facility] Main Entrance "A" at 12:45 P.M., and check in at the Admitting office.  Call this number if you have problems the morning of surgery:  [phone]  Call [phone] if you have any questions prior to your surgery date Monday-Friday 8am-4pm    Remember:  Do not eat after midnight the night before your surgery  You may drink clear liquids until 11:45 AM the morning of your surgery.   Clear liquids allowed are: Water, Non-Citrus Juices (without pulp), Carbonated Beverages, Clear Tea, Black Coffee Only, and Gatorade   Enhanced Recovery after Surgery for Orthopedics Enhanced Recovery after Surgery is a protocol used to improve the stress on your body and your recovery after surgery.  Patient Instructions  . The night before surgery:  o No food after midnight. ONLY clear liquids after midnight  .  Marland Kitchen The day of surgery : o Drink ONE (1) Pre-Surgery Clear Ensure as directed.   o This drink was given to you during your hospital  pre-op appointment visit. o The pre-op nurse will instruct you on the time to drink the  Pre-Surgery Ensure depending on your surgery time. o Finish the drink by 11:45 AM. o Nothing else to drink after completing the  Pre-Surgery Clear Ensure.     Take these medicines the morning of surgery with A SIP OF WATER:  DULoxetine (CYMBALTA) fluticasone (FLOVENT HFA) gabapentin (NEURONTIN)      If needed: albuterol (VENTOLIN HFA)  methocarbamol  (ROBAXIN) traMADol (ULTRAM)   Please bring all inhalers with you the day of surgery.  As of today, STOP taking any Aspirin (unless otherwise instructed by your surgeon) and Aspirin containing products, celecoxib (Celebrex) Aleve, Naproxen, Ibuprofen, Motrin, Advil, Goody's, BC's, all herbal medications, fish oil, and all vitamins.                      Do not wear jewelry, make up, or nail polish            Do not wear lotions, powders, perfumes, or deodorant.            Do not shave 48 hours prior to surgery.            Do not bring valuables to the hospital.            [facility] is not responsible for any belongings or valuables.  Do NOT Smoke (Tobacco/Vapping) or drink Alcohol 24 hours prior to your procedure If you use a CPAP at night, you may bring all equipment for your overnight stay.   Contacts, glasses, dentures or bridgework may not be worn into surgery.      For patients admitted to the hospital, discharge time will be determined by your treatment team.   Patients discharged the day of surgery will not be allowed to drive home, and someone needs to stay with them for 24 hours.    Special instructions:   [facility]- Preparing For Surgery  Before surgery, you can  play an important role. Because skin is not sterile, your skin needs to be as free of germs as possible. You can reduce the number of germs on your skin by washing with CHG (chlorahexidine gluconate) Soap before surgery.  CHG is an antiseptic cleaner which kills germs and bonds with the skin to continue killing germs even after washing.    Oral Hygiene is also important to reduce your risk of infection.  Remember - BRUSH YOUR TEETH THE MORNING OF SURGERY WITH YOUR REGULAR TOOTHPASTE  Please do not use if you have an allergy to CHG or antibacterial soaps. If your skin becomes reddened/irritated stop using the CHG.  Do not shave (including legs and underarms) for at least 48 hours prior to first CHG shower. It is OK  to shave your face.  Please follow these instructions carefully.   1. Shower the NIGHT BEFORE SURGERY and the MORNING OF SURGERY with CHG Soap.   2. If you chose to wash your hair, wash your hair first as usual with your normal shampoo.  3. After you shampoo, rinse your hair and body thoroughly to remove the shampoo.  4. Use CHG as you would any other liquid soap. You can apply CHG directly to the skin and wash gently with a scrungie or a clean washcloth.   5. Apply the CHG Soap to your body ONLY FROM THE NECK DOWN.  Do not use on open wounds or open sores. Avoid contact with your eyes, ears, mouth and genitals (private parts). Wash Face and genitals (private parts)  with your normal soap.   6. Wash thoroughly, paying special attention to the area where your surgery will be performed.  7. Thoroughly rinse your body with warm water from the neck down.  8. DO NOT shower/wash with your normal soap after using and rinsing off the CHG Soap.  9. Pat yourself dry with a CLEAN TOWEL.  10. Wear CLEAN PAJAMAS to bed the night before surgery, wear comfortable clothes the morning of surgery  11. Place CLEAN SHEETS on your bed the night of your first shower and DO NOT SLEEP WITH PETS.   Day of Surgery:   Do not apply any deodorants/lotions.  Please wear clean clothes to the hospital/surgery center.   Remember to brush your teeth WITH YOUR REGULAR TOOTHPASTE.   Please read over the following fact sheets that you were given.

## 2019-10-25 ENCOUNTER — Encounter (HOSPITAL_COMMUNITY): Payer: Self-pay

## 2019-10-25 NOTE — Progress Notes (Signed)
Anesthesia Chart Review:  Case: [id number] Date/Time: 10/27/19 1433   Procedure: left shoulder arthroscopy with arthroscopic vs open distal clavical excision (Left Shoulder)   Anesthesia type: General   Pre-op diagnosis: left shoulder acromioclavicular joint osteoarthritis   [facility]: [facility] ROOM 06 / [facility]   Surgeons: Cammy Copa, MD      DISCUSSION: Patient is a 64 year old female scheduled for the above procedure.  History includes never smoker, post-operative N/V, asthma, insomnia, anxiety, depression, lumbar surgery (L3-5 PLIF 07/16/09; explant L5 hardware, L2-3 posterolateral arthrodesis 04/16/16), c-spine surgery (C4-7 ACDF 04/16/11, C6-T1 posterior arthrodesis 05/17/12), cholecystectomy (11/10/05), left THA (11/09/18, GETA).  Following 04/16/16 lumbar fusion she had a PEA arrest. According to records, she had been given Percocet and Celebrex for back pain and Vistaril for facial itching in PACU at 1915. Patient feeling better at 1945, but at 2000, husband reported she "Just fell asleep after a hard deep breath". Patient noted to be have "purplish" lips and was unresponsive and pulseless. CODE Blue called and CPR initiated for approximately 5 minutes before ROSC.  Code documentation indicates first observed rhythm was asystole (she was not ion telemetry at the time of initial arrest) with SR with PVCs after ROSC. She received Narcan 0.4 mg. She vomited 3 minutes after ROSC. Glucose 169. PCCM was consulted. Allergic reaction was considered given her itching, but no rash or angioedema noted, so the felt most likely explanation for her cardiac arrest was due to acute respiratory failure due to narcotic administration leading to hypoxemic induced PEA arrest. EKG findings did show findings worrisome for ischemia. Cardiac enzymes 0.03-0.24 H-0.18H. Off pressors by 04/18/16. CXR showed low lung volumes without focal disease. 04/19/16 Echo showed normal LVEF with normal wall motion. Cardiology was not  consulted. She was discharged home on 04/20/16.    Since then she underwent left THA on 11/09/18 under GETA and without post-operative complication. Below outlined medications administered by anesthesia team:  - 11/09/18 pre/intra-operative medications, left THA, GETA: Sevoflurane, midazolam 2mg , fentanyl 250 mg (total), lidocaine 80 mg, propofol 130 mg, rocuronium 120 mg (total), ephedrine sulfate 50 mg (total), sugammadex 200 mg, ondansetron 4 mg, dexamethasone 8 mg, cefazolin 2 gm, tranexamic acid 1000 mg, Scopolamine1 1.5 mg/72 hour patch, LR 2000 mL (total), albumin 500 mL (total). UOP 600 mL. EBL 650 mL. - 04/16/16 pre/intra-operative medications lumbar fusion, GETA: Sevoflurane, fentanyl 300 mcg (total), lidocaine 100 mg, propofol 200 mg, rocuronium 70 mg (total), ondansetron 4 mg, phenylephrine 8340 mcg (total), Ancef 4 gm (total), Ofirmev 1000 mg, dexamethasone 5 mg, sugammadex 100 mg, LR 2200 ml (total). UOP 350 mL. EBL 200 mL.  Post-operative PEA arrest in 2017 felt likely due to respiratory arrest for sedation, narcotics and hypovolemia. No complication following THA last May. EKG then showed NSR. Discussed with anesthesiologist June, MD. Anesthesia team to evaluate on the day of surgery.Pre-surgical COVID-19 test negative.   VS: BP 110/77   Pulse 65   Temp 36.5 C (Oral)   Resp 18   Ht 5\' 6"  (1.676 m)   Wt 68.9 kg   SpO2 99%   BMI 24.50 kg/m    PROVIDERS: Val Eagle, MD is PCP    LABS: Preoperative labs noted. Glucose 60. No history of DM--as case currently scheduled, she is allowed clear liquids until 11:45 AM day of surgery per ERAS protocol.  (all labs ordered are listed, but only abnormal results are displayed)  Labs Reviewed  BASIC METABOLIC PANEL - Abnormal; Notable for  the following components:      Result Value   Glucose, Bld 60 (*)    All other components within normal limits  CBC - Abnormal; Notable for the following components:   WBC 3.6 (*)     All other components within normal limits     EKG: 11/05/18: Normal sinus rhythm Normal ECG HR is slower since previous tracing Confirmed by Mertie Moores [phone]) on 11/05/2018 4:32:37 PM   CV: Echo 04/19/16: Study Conclusions  - Left ventricle: The cavity size was normal. Wall thickness was  normal. Systolic function was normal. The estimated ejection  fraction was in the range of 60% to 65%. Wall motion was normal;  there were no regional wall motion abnormalities. Left  ventricular diastolic function parameters were normal.  - Aortic valve: Valve area (VTI): 3.78 cm^2. Valve area (Vmax):  4.28 cm^2.  - Atrial septum: No defect or patent foramen ovale was identified.  - Pulmonary arteries: Systolic pressure was mildly increased. PA  peak pressure: 36 mm Hg (S).  - Technically adequate study.    Past Medical History:  Diagnosis Date  . Anxiety   . Asthma    uses albuterol as needed "mostly weather induced".ALbuterol and Ventolin inhaler as needed  . Chronic back pain   . Chronic neck pain   . Complication of anesthesia    post-op PEA arrest 04/16/16, thought due to respriatory arrest from sedation, narcotic, hypovolemia  . Degenerative disc disease    cervical and lumbar area  . Depression   . History of bronchitis 2016  . Insomnia    takes Ambien nightly  . Muscle spasm    takes Robaxin daily   . Pneumonia    hx of-2016  . PONV (postoperative nausea and vomiting)   . Weakness    numbness and tingling in legs    Past Surgical History:  Procedure Laterality Date  . BACK SURGERY     x 3 lumbar  . CHOLECYSTECTOMY    . DILATION AND CURETTAGE OF UTERUS     x 2  . KNEE SURGERY Left    x 2  . left knee arthroscopy      x 2  . POSTERIOR CERVICAL FUSION/FORAMINOTOMY  05/17/2012   Procedure: POSTERIOR CERVICAL FUSION/FORAMINOTOMY LEVEL 2;  Surgeon: Hosie Spangle, MD;  Location: [facility];  Service: Neurosurgery;  Laterality: N/A;  cervical six  to Thoracic One posterior cervical arthrodesis with instrumentation  . SHOULDER SURGERY     right  . SPINE SURGERY     x 5  . TOE FUSION Left 09/2016  . TOTAL HIP ARTHROPLASTY Left 11/09/2018   Procedure: LEFT TOTAL HIP ARTHROPLASTY DIRECT ANTERIOR APPROACH;  Surgeon: Meredith Pel, MD;  Location: [facility] ORS;  Service: Orthopedics;  Laterality: Left;    MEDICATIONS: . albuterol (PROVENTIL) (2.5 MG/3ML) 0.083% nebulizer solution  . albuterol (VENTOLIN HFA) 108 (90 Base) MCG/ACT inhaler  . celecoxib (CELEBREX) 200 MG capsule  . DULoxetine (CYMBALTA) 20 MG capsule  . fluticasone (FLOVENT HFA) 44 MCG/ACT inhaler  . gabapentin (NEURONTIN) 300 MG capsule  . methocarbamol (ROBAXIN) 750 MG tablet  . traMADol (ULTRAM) 50 MG tablet  . zolpidem (AMBIEN) 10 MG tablet   No current facility-administered medications for this encounter.    Myra Gianotti, PA-C Surgical Short Stay/Anesthesiology [facility] Phone [phone] [facility] Phone [phone] 10/25/2019 2:17 PM

## 2019-10-25 NOTE — Anesthesia Preprocedure Evaluation (Addendum)
Anesthesia Evaluation  Patient identified by MRN, date of birth, ID band Patient awake    Reviewed: Allergy & Precautions, NPO status , Patient's Chart, lab work & pertinent test results  History of Anesthesia Complications (+) PONV and history of anesthetic complications  Airway Mallampati: II  TM Distance: >3 FB Neck ROM: Full    Dental no notable dental hx. (+) Teeth Intact, Caps   Pulmonary asthma , pneumonia, resolved,    Pulmonary exam normal breath sounds clear to auscultation       Cardiovascular negative cardio ROS Normal cardiovascular exam Rate:Normal  EKG- NSR, normal   Neuro/Psych PSYCHIATRIC DISORDERS Anxiety Depression negative neurological ROS     GI/Hepatic negative GI ROS, Neg liver ROS,   Endo/Other  negative endocrine ROS  Renal/GU negative Renal ROS  negative genitourinary   Musculoskeletal  (+) Arthritis , Osteoarthritis,  AC joint arthritis   Abdominal   Peds  Hematology   Anesthesia Other Findings   Reproductive/Obstetrics                             Anesthesia Physical Anesthesia Plan  ASA: II  Anesthesia Plan: General   Post-op Pain Management:  Regional for Post-op pain   Induction: Intravenous  PONV Risk Score and Plan: 4 or greater and Scopolamine patch - Pre-op, Ondansetron, Midazolam, Dexamethasone and Treatment may vary due to age or medical condition  Airway Management Planned: Oral ETT  Additional Equipment:   Intra-op Plan:   Post-operative Plan: Extubation in OR  Informed Consent: I have reviewed the patients History and Physical, chart, labs and discussed the procedure including the risks, benefits and alternatives for the proposed anesthesia with the patient or authorized representative who has indicated his/her understanding and acceptance.     Dental advisory given  Plan Discussed with: CRNA and Surgeon  Anesthesia Plan Comments:  (See PAT note written 10/25/2019 by Shonna Chock, PA-C.  Post-operative PEA arrest in 2017 felt likely due to respiratory arrest from sedation, narcotics and hypovolemia. No complication following THA last May.)       Anesthesia Quick Evaluation

## 2019-10-26 ENCOUNTER — Ambulatory Visit: Payer: Self-pay

## 2019-10-27 ENCOUNTER — Other Ambulatory Visit: Payer: Self-pay

## 2019-10-27 ENCOUNTER — Encounter (HOSPITAL_COMMUNITY): Payer: Self-pay | Admitting: Orthopedic Surgery

## 2019-10-27 ENCOUNTER — Ambulatory Visit (HOSPITAL_COMMUNITY)
Admission: RE | Admit: 2019-10-27 | Discharge: 2019-10-27 | Disposition: A | Discharge status: Home / Self Care | Payer: BC Managed Care – PPO | Attending: Orthopedic Surgery | Admitting: Orthopedic Surgery

## 2019-10-27 ENCOUNTER — Encounter (HOSPITAL_COMMUNITY)
Admission: RE | Disposition: A | Discharge status: Home / Self Care | Payer: Self-pay | Source: Home / Self Care | Attending: Orthopedic Surgery

## 2019-10-27 ENCOUNTER — Ambulatory Visit (HOSPITAL_COMMUNITY): Payer: BC Managed Care – PPO | Admitting: Vascular Surgery

## 2019-10-27 ENCOUNTER — Ambulatory Visit (HOSPITAL_COMMUNITY): Payer: BC Managed Care – PPO | Admitting: Certified Registered Nurse Anesthetist

## 2019-10-27 DIAGNOSIS — Z981 Arthrodesis status: Secondary | ICD-10-CM | POA: Insufficient documentation

## 2019-10-27 DIAGNOSIS — J45909 Unspecified asthma, uncomplicated: Secondary | ICD-10-CM | POA: Diagnosis not present

## 2019-10-27 DIAGNOSIS — Z79899 Other long term (current) drug therapy: Secondary | ICD-10-CM | POA: Diagnosis not present

## 2019-10-27 DIAGNOSIS — S43432D Superior glenoid labrum lesion of left shoulder, subsequent encounter: Secondary | ICD-10-CM

## 2019-10-27 DIAGNOSIS — M75112 Incomplete rotator cuff tear or rupture of left shoulder, not specified as traumatic: Secondary | ICD-10-CM | POA: Insufficient documentation

## 2019-10-27 DIAGNOSIS — M19012 Primary osteoarthritis, left shoulder: Secondary | ICD-10-CM | POA: Insufficient documentation

## 2019-10-27 DIAGNOSIS — F329 Major depressive disorder, single episode, unspecified: Secondary | ICD-10-CM | POA: Diagnosis not present

## 2019-10-27 DIAGNOSIS — X58XXXA Exposure to other specified factors, initial encounter: Secondary | ICD-10-CM | POA: Insufficient documentation

## 2019-10-27 DIAGNOSIS — Z888 Allergy status to other drugs, medicaments and biological substances status: Secondary | ICD-10-CM | POA: Insufficient documentation

## 2019-10-27 DIAGNOSIS — Z791 Long term (current) use of non-steroidal anti-inflammatories (NSAID): Secondary | ICD-10-CM | POA: Diagnosis not present

## 2019-10-27 DIAGNOSIS — Z96642 Presence of left artificial hip joint: Secondary | ICD-10-CM | POA: Insufficient documentation

## 2019-10-27 DIAGNOSIS — G4700 Insomnia, unspecified: Secondary | ICD-10-CM | POA: Insufficient documentation

## 2019-10-27 DIAGNOSIS — Z7951 Long term (current) use of inhaled steroids: Secondary | ICD-10-CM | POA: Insufficient documentation

## 2019-10-27 DIAGNOSIS — F419 Anxiety disorder, unspecified: Secondary | ICD-10-CM | POA: Insufficient documentation

## 2019-10-27 DIAGNOSIS — S43432A Superior glenoid labrum lesion of left shoulder, initial encounter: Secondary | ICD-10-CM | POA: Diagnosis not present

## 2019-10-27 HISTORY — PX: SHOULDER ARTHROSCOPY: SHX128

## 2019-10-27 SURGERY — ARTHROSCOPY, SHOULDER
Anesthesia: Regional | Site: Shoulder | Laterality: Left

## 2019-10-27 MED ORDER — EPINEPHRINE PF 1 MG/ML IJ SOLN
INTRAMUSCULAR | Status: AC
  Filled 2019-10-27: qty 4

## 2019-10-27 MED ORDER — BUPIVACAINE-EPINEPHRINE (PF) 0.5% -1:200000 IJ SOLN
INTRAMUSCULAR | Status: DC | PRN
  Administered 2019-10-27: 20 mL via PERINEURAL

## 2019-10-27 MED ORDER — MIDAZOLAM HCL 2 MG/2ML IJ SOLN
INTRAMUSCULAR | Status: AC
  Filled 2019-10-27: qty 2

## 2019-10-27 MED ORDER — ONDANSETRON HCL 4 MG/2ML IJ SOLN: INTRAMUSCULAR | Status: DC | PRN

## 2019-10-27 MED ORDER — SODIUM CHLORIDE 0.9 % IR SOLN
Status: DC | PRN
  Administered 2019-10-27: 6000 mL

## 2019-10-27 MED ORDER — SUGAMMADEX SODIUM 200 MG/2ML IV SOLN
INTRAVENOUS | Status: DC | PRN
  Administered 2019-10-27: 200 mg via INTRAVENOUS

## 2019-10-27 MED ORDER — LIDOCAINE 2% (20 MG/ML) 5 ML SYRINGE
INTRAMUSCULAR | Status: DC | PRN
  Administered 2019-10-27: 60 mg via INTRAVENOUS

## 2019-10-27 MED ORDER — POVIDONE-IODINE 10 % EX SWAB: 2.0000 "application " | Freq: Once | CUTANEOUS | Status: DC

## 2019-10-27 MED ORDER — MIDAZOLAM HCL 2 MG/2ML IJ SOLN
INTRAMUSCULAR | Status: AC
  Administered 2019-10-27: 11:00:00 2 mg via INTRAVENOUS
  Filled 2019-10-27: qty 2

## 2019-10-27 MED ORDER — FENTANYL CITRATE (PF) 250 MCG/5ML IJ SOLN
INTRAMUSCULAR | Status: AC
  Filled 2019-10-27: qty 5

## 2019-10-27 MED ORDER — CEFAZOLIN SODIUM-DEXTROSE 2-4 GM/100ML-% IV SOLN
2.0000 g | INTRAVENOUS | Status: AC
  Administered 2019-10-27: 2 g via INTRAVENOUS

## 2019-10-27 MED ORDER — LACTATED RINGERS IV SOLN: INTRAVENOUS | Status: DC

## 2019-10-27 MED ORDER — FENTANYL CITRATE (PF) 100 MCG/2ML IJ SOLN
INTRAMUSCULAR | Status: AC
  Administered 2019-10-27: 50 ug via INTRAVENOUS
  Filled 2019-10-27: qty 2

## 2019-10-27 MED ORDER — EPINEPHRINE PF 1 MG/ML IJ SOLN
INTRAMUSCULAR | Status: DC | PRN
  Administered 2019-10-27: 2 mL

## 2019-10-27 MED ORDER — FENTANYL CITRATE (PF) 250 MCG/5ML IJ SOLN
INTRAMUSCULAR | Status: DC | PRN
  Administered 2019-10-27: 100 ug via INTRAVENOUS

## 2019-10-27 MED ORDER — ONDANSETRON HCL 4 MG/2ML IJ SOLN
INTRAMUSCULAR | Status: DC | PRN
  Administered 2019-10-27: 4 mg via INTRAVENOUS

## 2019-10-27 MED ORDER — BUPIVACAINE LIPOSOME 1.3 % IJ SUSP
INTRAMUSCULAR | Status: DC | PRN
  Administered 2019-10-27: 10 mL via PERINEURAL

## 2019-10-27 MED ORDER — EPINEPHRINE PF 1 MG/ML IJ SOLN
INTRAMUSCULAR | Status: DC | PRN
  Administered 2019-10-27: .1 mL via INTRAMUSCULAR

## 2019-10-27 MED ORDER — POVIDONE-IODINE 7.5 % EX SOLN
Freq: Once | CUTANEOUS | Status: DC
  Filled 2019-10-27: qty 118

## 2019-10-27 MED ORDER — PROPOFOL 10 MG/ML IV BOLUS
INTRAVENOUS | Status: DC | PRN
  Administered 2019-10-27: 120 mg via INTRAVENOUS

## 2019-10-27 MED ORDER — PROPOFOL 10 MG/ML IV BOLUS
INTRAVENOUS | Status: AC
  Filled 2019-10-27: qty 20

## 2019-10-27 MED ORDER — SODIUM CHLORIDE (PF) 0.9 % IJ SOLN
INTRAMUSCULAR | Status: DC | PRN
  Administered 2019-10-27: 30 mL via INTRAVENOUS

## 2019-10-27 MED ORDER — ONDANSETRON HCL 4 MG/2ML IJ SOLN: 4.0000 mg | Freq: Once | INTRAMUSCULAR | Status: DC | PRN

## 2019-10-27 MED ORDER — CEFAZOLIN SODIUM-DEXTROSE 2-4 GM/100ML-% IV SOLN
INTRAVENOUS | Status: AC
  Filled 2019-10-27: qty 100

## 2019-10-27 MED ORDER — ROCURONIUM BROMIDE 10 MG/ML (PF) SYRINGE
PREFILLED_SYRINGE | INTRAVENOUS | Status: DC | PRN
  Administered 2019-10-27: 50 mg via INTRAVENOUS

## 2019-10-27 MED ORDER — PHENYLEPHRINE HCL-NACL 10-0.9 MG/250ML-% IV SOLN
INTRAVENOUS | Status: DC | PRN
  Administered 2019-10-27: 40 ug/min via INTRAVENOUS

## 2019-10-27 MED ORDER — MIDAZOLAM HCL 2 MG/2ML IJ SOLN: 2.0000 mg | Freq: Once | INTRAMUSCULAR | Status: AC

## 2019-10-27 MED ORDER — FENTANYL CITRATE (PF) 100 MCG/2ML IJ SOLN: 25.0000 ug | INTRAMUSCULAR | Status: DC | PRN

## 2019-10-27 MED ORDER — ROCURONIUM BROMIDE 10 MG/ML (PF) SYRINGE
PREFILLED_SYRINGE | INTRAVENOUS | Status: AC
  Filled 2019-10-27: qty 10

## 2019-10-27 MED ORDER — DEXAMETHASONE SODIUM PHOSPHATE 10 MG/ML IJ SOLN
INTRAMUSCULAR | Status: DC | PRN
  Administered 2019-10-27: 5 mg via INTRAVENOUS

## 2019-10-27 MED ORDER — PHENYLEPHRINE 40 MCG/ML (10ML) SYRINGE FOR IV PUSH (FOR BLOOD PRESSURE SUPPORT)
PREFILLED_SYRINGE | INTRAVENOUS | Status: DC | PRN
  Administered 2019-10-27 (×2): 120 ug via INTRAVENOUS

## 2019-10-27 MED ORDER — FENTANYL CITRATE (PF) 100 MCG/2ML IJ SOLN: 50.0000 ug | Freq: Once | INTRAMUSCULAR | Status: AC

## 2019-10-27 MED ORDER — OXYCODONE HCL 5 MG PO TABS: 5.0000 mg | ORAL_TABLET | ORAL | 0 refills | Status: DC | PRN

## 2019-10-27 SURGICAL SUPPLY — 47 items
AID PSTN UNV HD RSTRNT DISP (MISCELLANEOUS) ×1
ALCOHOL 70% 16 OZ (MISCELLANEOUS) ×2 IMPLANT
ANCHOR SUT 1.8 FBRTK KNTLS 2SU (Anchor) ×2 IMPLANT
APL PRP 5X4 STRL LF DISP 70% (MISCELLANEOUS) ×2
APPLICATOR CHLORAPREP 3ML ORNG (MISCELLANEOUS) ×2 IMPLANT
BLADE EXCALIBUR 4.0X13 (MISCELLANEOUS) ×2 IMPLANT
BLADE SURG 11 STRL SS (BLADE) ×2 IMPLANT
BURR OVAL 8 FLU 4.0X13 (MISCELLANEOUS) ×1 IMPLANT
DRAPE IMP U-DRAPE 54X76 (DRAPES) ×2 IMPLANT
DRAPE INCISE IOBAN 66X45 STRL (DRAPES) ×2 IMPLANT
DRAPE STERI 35X30 U-POUCH (DRAPES) ×2 IMPLANT
DRAPE U-SHAPE 47X51 STRL (DRAPES) ×4 IMPLANT
DRSG AQUACEL AG ADV 3.5X 4 (GAUZE/BANDAGES/DRESSINGS) ×2 IMPLANT
DRSG XEROFORM 1X8 (GAUZE/BANDAGES/DRESSINGS) ×1 IMPLANT
DW OUTFLOW CASSETTE/TUBE SET (MISCELLANEOUS) ×2 IMPLANT
ELECT REM PT RETURN 9FT ADLT (ELECTROSURGICAL) ×2
ELECTRODE REM PT RTRN 9FT ADLT (ELECTROSURGICAL) ×1 IMPLANT
EXCALIBUR 3.8MM X 13CM (MISCELLANEOUS) ×1 IMPLANT
GAUZE SPONGE 4X4 12PLY STRL (GAUZE/BANDAGES/DRESSINGS) ×1 IMPLANT
GAUZE XEROFORM 1X8 LF (GAUZE/BANDAGES/DRESSINGS) ×2 IMPLANT
GLOVE BIOGEL PI IND STRL 8 (GLOVE) ×1 IMPLANT
GLOVE BIOGEL PI INDICATOR 8 (GLOVE) ×1
GLOVE ECLIPSE 8.0 STRL XLNG CF (GLOVE) ×2 IMPLANT
GOWN STRL REUS W/ TWL LRG LVL3 (GOWN DISPOSABLE) ×3 IMPLANT
GOWN STRL REUS W/TWL LRG LVL3 (GOWN DISPOSABLE) ×6
IV NS IRRIG 3000ML ARTHROMATIC (IV SOLUTION) ×4 IMPLANT
KIT BASIN OR (CUSTOM PROCEDURE TRAY) ×2 IMPLANT
KIT STR SPEAR 1.8 FBRTK DISP (KITS) ×1 IMPLANT
KIT TURNOVER KIT B (KITS) ×2 IMPLANT
MANIFOLD NEPTUNE II (INSTRUMENTS) ×2 IMPLANT
MARKER SKIN DUAL TIP RULER LAB (MISCELLANEOUS) ×1 IMPLANT
NDL HYPO 25X1 1.5 SAFETY (NEEDLE) ×1 IMPLANT
NDL SPNL 18GX3.5 QUINCKE PK (NEEDLE) ×1 IMPLANT
NEEDLE HYPO 25X1 1.5 SAFETY (NEEDLE) ×2 IMPLANT
NEEDLE SPNL 18GX3.5 QUINCKE PK (NEEDLE) ×2 IMPLANT
NS IRRIG 1000ML POUR BTL (IV SOLUTION) ×2 IMPLANT
PACK SHOULDER (CUSTOM PROCEDURE TRAY) ×2 IMPLANT
RESTRAINT HEAD UNIVERSAL NS (MISCELLANEOUS) ×2 IMPLANT
SLING ARM IMMOBILIZER LRG (SOFTGOODS) ×1 IMPLANT
STRIP CLOSURE SKIN 1/2X4 (GAUZE/BANDAGES/DRESSINGS) ×1 IMPLANT
SUCTION FRAZIER HANDLE 10FR (MISCELLANEOUS)
SUCTION TUBE FRAZIER 10FR DISP (MISCELLANEOUS) IMPLANT
SUT ETHILON 3 0 PS 1 (SUTURE) ×3 IMPLANT
TOWEL GREEN STERILE (TOWEL DISPOSABLE) ×2 IMPLANT
TOWEL GREEN STERILE FF (TOWEL DISPOSABLE) ×2 IMPLANT
TUBING ARTHROSCOPY IRRIG 16FT (MISCELLANEOUS) ×2 IMPLANT
WAND STAR VAC 90 (SURGICAL WAND) ×2 IMPLANT

## 2019-10-27 NOTE — Transfer of Care (Signed)
Immediate Anesthesia Transfer of Care Note  Patient: Stephanie Noble  Procedure(s) Performed: left shoulder arthroscopy with arthroscopic vs open distal clavical excision (Left Shoulder)  Patient Location: PACU  Anesthesia Type:GA combined with regional for post-op pain  Level of Consciousness: awake, alert  and patient cooperative  Airway & Oxygen Therapy: Patient Spontanous Breathing  Post-op Assessment: Report given to RN and Post -op Vital signs reviewed and stable  Post vital signs: Reviewed and stable  Last Vitals:  Vitals Value Taken Time  BP 132/85 10/27/19 1445  Temp 36.4 C 10/27/19 1445  Pulse 77 10/27/19 1445  Resp 14 10/27/19 1445  SpO2 93 % 10/27/19 1445    Last Pain:  Vitals:   10/27/19 1133  PainSc: 0-No pain      Patients Stated Pain Goal: 3 (10/27/19 1045)  Complications: No apparent anesthesia complications

## 2019-10-27 NOTE — Anesthesia Procedure Notes (Signed)
Anesthesia Regional Block: Interscalene brachial plexus block   Pre-Anesthetic Checklist: ,, timeout performed, Correct Patient, Correct Site, Correct Laterality, Correct Procedure, Correct Position, site marked, Risks and benefits discussed,  Surgical consent,  Pre-op evaluation,  At surgeon's request and post-op pain management  Laterality: Left  Prep: chloraprep       Needles:  Injection technique: Single-shot  Needle Type: Echogenic Stimulator Needle     Needle Length: 9cm  Needle Gauge: 21   Needle insertion depth: 6 cm   Additional Needles:   Procedures:,,,, ultrasound used (permanent image in chart),,,,  Narrative:  Start time: 10/27/2019 11:14 AM End time: 10/27/2019 11:19 AM Injection made incrementally with aspirations every 5 mL.  Performed by: Personally  Anesthesiologist: Mal Amabile, MD  Additional Notes: Timeout performed. Patient sedated. Relevant anatomy ID'd using [place]. Incremental 2-11ml injection of LA with frequent aspiration. Patient tolerated procedure well.        Left Interscalene Block

## 2019-10-27 NOTE — Anesthesia Procedure Notes (Signed)
Procedure Name: Intubation Date/Time: 10/27/2019 12:25 PM Performed by: Janace Litten, CRNA Pre-anesthesia Checklist: Patient identified, Emergency Drugs available, Suction available and Patient being monitored Patient Re-evaluated:Patient Re-evaluated prior to induction Oxygen Delivery Method: Circle System Utilized Preoxygenation: Pre-oxygenation with 100% oxygen Induction Type: IV induction Ventilation: Mask ventilation without difficulty Laryngoscope Size: Mac and 3 Grade View: Grade I Tube type: Oral Tube size: 7.0 mm Number of attempts: 1 Airway Equipment and Method: Stylet Placement Confirmation: ETT inserted through vocal cords under direct vision,  positive ETCO2 and breath sounds checked- equal and bilateral Secured at: 7 cm Tube secured with: Tape Dental Injury: Teeth and Oropharynx as per pre-operative assessment

## 2019-10-27 NOTE — Discharge Instructions (Signed)
General Anesthesia, Adult, Care After This sheet gives you information about how to care for yourself after your procedure. Your health care provider may also give you more specific instructions. If you have problems or questions, contact your health care provider. What can I expect after the procedure? After the procedure, the following side effects are common:  Pain or discomfort at the IV site.  Nausea.  Vomiting.  Sore throat.  Trouble concentrating.  Feeling cold or chills.  Weak or tired.  Sleepiness and fatigue.  Soreness and body aches. These side effects can affect parts of the body that were not involved in surgery. Follow these instructions at home:  For at least 24 hours after the procedure:  Have a responsible adult stay with you. It is important to have someone help care for you until you are awake and alert.  Rest as needed.  Do not: ? Participate in activities in which you could fall or become injured. ? Drive. ? Use heavy machinery. ? Drink alcohol. ? Take sleeping pills or medicines that cause drowsiness. ? Make important decisions or sign legal documents. ? Take care of children on your own. Eating and drinking  Follow any instructions from your health care provider about eating or drinking restrictions.  When you feel hungry, start by eating small amounts of foods that are soft and easy to digest (bland), such as toast. Gradually return to your regular diet.  Drink enough fluid to keep your urine pale yellow.  If you vomit, rehydrate by drinking water, juice, or clear broth. General instructions  If you have sleep apnea, surgery and certain medicines can increase your risk for breathing problems. Follow instructions from your health care provider about wearing your sleep device: ? Anytime you are sleeping, including during daytime naps. ? While taking prescription pain medicines, sleeping medicines, or medicines that make you drowsy.  Return to  your normal activities as told by your health care provider. Ask your health care provider what activities are safe for you.  Take over-the-counter and prescription medicines only as told by your health care provider.  If you smoke, do not smoke without supervision.  Keep all follow-up visits as told by your health care provider. This is important. Contact a health care provider if:  You have nausea or vomiting that does not get better with medicine.  You cannot eat or drink without vomiting.  You have pain that does not get better with medicine.  You are unable to pass urine.  You develop a skin rash.  You have a fever.  You have redness around your IV site that gets worse. Get help right away if:  You have difficulty breathing.  You have chest pain.  You have blood in your urine or stool, or you vomit blood. Summary  After the procedure, it is common to have a sore throat or nausea. It is also common to feel tired.  Have a responsible adult stay with you for the first 24 hours after general anesthesia. It is important to have someone help care for you until you are awake and alert.  When you feel hungry, start by eating small amounts of foods that are soft and easy to digest (bland), such as toast. Gradually return to your regular diet.  Drink enough fluid to keep your urine pale yellow.  Return to your normal activities as told by your health care provider. Ask your health care provider what activities are safe for you. This information is not   intended to replace advice given to you by your health care provider. Make sure you discuss any questions you have with your health care provider. Document Revised: 06/19/2017 Document Reviewed: 01/30/2017 Elsevier Patient Education  2020 [employer]  

## 2019-10-27 NOTE — Op Note (Signed)
NAME: Noble, Stephanie MEDICAL RECORD [id number] ACCOUNT [id number] DATE OF BIRTH:03-12-1956 FACILITY: [facility] LOCATION: [facility] PHYSICIAN:Stephanie Deshotels Diamantina Providence, MD  OPERATIVE REPORT  DATE OF PROCEDURE:  10/27/2019  PREOPERATIVE DIAGNOSIS:  Left shoulder acromioclavicular joint arthritis.  POSTOPERATIVE DIAGNOSIS:  Left shoulder acromioclavicular joint arthritis and superior labrum anterior and posterior tear, degenerative.  PROCEDURE:  Left shoulder arthroscopy with limited debridement of superior labrum as well as partial thickness articular-sided tearing of the supraspinatus with arthroscopic distal clavicle excision and open biceps tenodesis.  SURGEON:  Stephanie Copa, MD  ASSISTANT:  Stephanie Cai, PA  INDICATIONS:  The patient is a 64 year old patient with left shoulder pain.  Underwent left shoulder AC joint injection for AC joint arthritis several months ago and had good relief, but the pain has recurred.  Had right shoulder distal clavicle excision  and had a very good result with that and presents now for operative management after explanation of risks and benefits.  PROCEDURE IN DETAIL:  The patient was brought to the operating room where general anesthetic was induced.  Preoperative antibiotics administered.  Timeout was called.  The patient was placed in the beach chair with the head in neutral position.  Left  shoulder, arm and hand prescrubbed with alcohol and Betadine, allowed to air dry, prepped with ChloraPrep solution and draped in a sterile manner.  Examination under anesthesia demonstrated full range of motion with good stability anteriorly and  posteriorly.  Stephanie Noble was used to seal the operative field.  A posterior timeout was called.  A solution of saline and epinephrine injected into the subacromial space.  Next, the posterior portal was established 2 cm medial and inferior to the  posterolateral margin of acromion.  Diagnostic arthroscopy was performed.  The  patient had a degenerative SLAP tear type 2 unstable with significant fraying and instability of the superior anchor.  Partial thickness tearing of the rotator cuff was also  observed.  Early grade I osteoarthritis was also present on the humeral head, but not on the glenoid.  Anterior inferior, posterior inferior, glenohumeral ligaments were intact.  Next, the anterior portal created under direct visualization.  Biceps  tendon was released.  Superior labrum debrided.  Portal reversal was utilized to allow for debridement of the undersurface tear of the supraspinatus.  This was only about 10% thickness.  No full thickness component was present.  Following debridement and  biceps tendon release, instruments were removed from the joint and placed into the subacromial space.  Lateral portal was created.  Bursectomy performed.  Distal clavicle was then excised approximately 8-9 mm using the bur.  The posterior and superior  ligaments were preserved.  After this, the instruments were removed.  Portals were closed using 3-0 nylon.  Incision was then made off the anterolateral margin of the acromion.  Skin and subcutaneous tissue were sharply divided.  Deltoid was split a  measured distance of 3 cm from the anterolateral margin of the acromion marked with #1 Vicryl suture.  The transverse humeral ligament was divided in its mid-section and the biceps tendon was delivered.  The biceps tendon was then tenodesed under  appropriate tension using 2 knotless FiberTak sutures.  Transverse humeral ligament was then closed over the biceps tendon.  Secure fixation was achieved.  Thorough irrigation was performed.  Deltoid split closed using #1 Vicryl suture followed by  interrupted inverted 2-0 Vicryl suture and 3-0 Monocryl and Steri-Strips and impervious dressings.  The patient tolerated the procedure well without immediate complication.  Stephanie Noble's  assistance was required at all times during the case for retraction,  opening  and closing.  His assistance was medical necessity.  CN/NUANCE  D:10/27/2019 T:10/27/2019 JOB:[id number]

## 2019-10-27 NOTE — H&P (Signed)
Stephanie Noble is an 64 y.o. female.   Chief Complaint: Left shoulder pain HPI: Stephanie Noble is a 26 year old patient with left shoulder pain.'s been going on for many months.  MRI scan shows intact rotator cuff with significant edema in the [facility] joint.  She has had good relief from 1 AC joint injection but that relief was short-lived.  She presents now for operative management after explanation of risks and benefits of left shoulder arthroscopy with arthroscopic versus open distal clavicle excision.  Past Medical History:  Diagnosis Date  . Anxiety   . Asthma    uses albuterol as needed "mostly weather induced".ALbuterol and Ventolin inhaler as needed  . Chronic back pain   . Chronic neck pain   . Complication of anesthesia    post-op PEA arrest 04/16/16, thought due to respriatory arrest from sedation, narcotic, hypovolemia  . Degenerative disc disease    cervical and lumbar area  . Depression   . History of bronchitis 2016  . Insomnia    takes Ambien nightly  . Muscle spasm    takes Robaxin daily   . Pneumonia    hx of-2016  . PONV (postoperative nausea and vomiting)   . Weakness    numbness and tingling in legs    Past Surgical History:  Procedure Laterality Date  . BACK SURGERY     x 3 lumbar  . CHOLECYSTECTOMY    . DILATION AND CURETTAGE OF UTERUS     x 2  . KNEE SURGERY Left    x 2  . left knee arthroscopy      x 2  . POSTERIOR CERVICAL FUSION/FORAMINOTOMY  05/17/2012   Procedure: POSTERIOR CERVICAL FUSION/FORAMINOTOMY LEVEL 2;  Surgeon: Hewitt Shorts, MD;  Location: [facility];  Service: Neurosurgery;  Laterality: N/A;  cervical six to Thoracic One posterior cervical arthrodesis with instrumentation  . SHOULDER SURGERY     right  . SPINE SURGERY     x 5  . TOE FUSION Left 09/2016  . TOTAL HIP ARTHROPLASTY Left 11/09/2018   Procedure: LEFT TOTAL HIP ARTHROPLASTY DIRECT ANTERIOR APPROACH;  Surgeon: Cammy Copa, MD;  Location: [facility];  Service:  Orthopedics;  Laterality: Left;    Family History  Problem Relation Age of Onset  . Dementia Mother   . Other Sister   . Alcohol abuse Other    Social History:  reports that she has never smoked. She has never used smokeless tobacco. She reports that she does not drink alcohol or use drugs.  Allergies:  Allergies  Allergen Reactions  . Vistaril [Hydroxyzine Hcl] Other (See Comments)    May have caused cardiac arrest     Medications Prior to Admission  Medication Sig Dispense Refill  . albuterol (VENTOLIN HFA) 108 (90 Base) MCG/ACT inhaler Inhale 2 sprays QID prn wheezing (Patient taking differently: Inhale 2 puffs into the lungs 4 (four) times daily as needed for wheezing or shortness of breath. ) 18 g 0  . celecoxib (CELEBREX) 200 MG capsule Take 1 capsule (200 mg total) by mouth daily. 30 capsule 5  . DULoxetine (CYMBALTA) 20 MG capsule Take 2 capsules (40 mg total) by mouth daily. 180 capsule 1  . fluticasone (FLOVENT HFA) 44 MCG/ACT inhaler INHALE 2 PUFFS TWICE A DAY 30 Inhaler 5  . gabapentin (NEURONTIN) 300 MG capsule Take 2 capsules in the morning , 2 capsules at lunch and 3 capsules in the evening 630 capsule 5  . methocarbamol (ROBAXIN) 750 MG tablet  TAKE 1 TABLET BY MOUTH THREE TIMES A DAY AS NEEDED 90 tablet 5  . zolpidem (AMBIEN) 10 MG tablet Take 1 tablet (10 mg total) by mouth at bedtime. 90 tablet 1  . albuterol (PROVENTIL) (2.5 MG/3ML) 0.083% nebulizer solution Take 3 mLs (2.5 mg total) by nebulization every 4 (four) hours as needed for wheezing or shortness of breath. 150 mL 5  . traMADol (ULTRAM) 50 MG tablet Take 1 tablet (50 mg total) by mouth daily as needed. 10 tablet 0    No results found for this or any previous visit (from the past 48 hour(s)). No results found.  Review of Systems  Musculoskeletal: Positive for arthralgias.  All other systems reviewed and are negative.   Blood pressure 120/64, pulse 76, temperature 98 F (36.7 C), resp. rate 15, height  5\' 6"  (1.676 m), weight 64.9 kg, SpO2 100 %. Physical Exam  Constitutional: She appears well-developed.  HENT:  Head: Normocephalic.  Eyes: Pupils are equal, round, and reactive to light.  Cardiovascular: Normal rate.  Respiratory: Effort normal.  Musculoskeletal:     Cervical back: Normal range of motion.  Neurological: She is alert.  Skin: Skin is warm.  Psychiatric: She has a normal mood and affect.  Examination of the left shoulder demonstrates tenderness at the [facility] joint.  Pain with crossarm adduction.  No restriction of external rotation of 15 degrees of abduction.  Shoulder stability is good.  Less than a centimeter sulcus sign.  Rotator cuff strength is also good infraspinatus supraspinatus and subscap muscle testing  Assessment/Plan Impression is left shoulder AC joint arthritis.  Plan is arthroscopy bursectomy and AC joint excision.  Depending on the fluid mechanics of the field this will be either arthroscopic or open.  Risk and benefits were discussed with the patient include not limited to infection nerve vessel damage incomplete pain relief as well as potential need for more surgery.  All questions answered.  Anderson Malta, MD 10/27/2019, 12:08 PM

## 2019-10-27 NOTE — Brief Op Note (Signed)
   10/27/2019  2:23 PM  PATIENT:  Stephanie Noble  64 y.o. female  PRE-OPERATIVE DIAGNOSIS:  left shoulder acromioclavicular joint osteoarthritis  POST-OPERATIVE DIAGNOSIS:  left shoulder acromioclavicular joint osteoarthritis - SLAP tear  PROCEDURE:  Procedure(s): left shoulder arthroscopy with arthroscopic  SLAP debridement and biceps tenodesis SURGEON:  Surgeon(s): August Saucer, Corrie Mckusick, MD  ASSISTANT: magnant pa  ANESTHESIA:   general  EBL: 25 ml    Total I/O In: 100 [IV Piggyback:100] Out: -   BLOOD ADMINISTERED: none  DRAINS: none   LOCAL MEDICATIONS USED:  none  SPECIMEN:  No Specimen  COUNTS:  YES  TOURNIQUET:  * No tourniquets in log *  DICTATION: .Other Dictation: Dictation Number [id number]  PLAN OF CARE: Discharge to home after PACU  PATIENT DISPOSITION:  PACU - hemodynamically stable

## 2019-10-28 NOTE — Anesthesia Postprocedure Evaluation (Signed)
Anesthesia Post Note  Patient: Stephanie Noble  Procedure(s) Performed: left shoulder arthroscopy with arthroscopic vs open distal clavical excision (Left Shoulder)     Patient location during evaluation: PACU Anesthesia Type: General and Regional Level of consciousness: awake and alert Pain management: pain level controlled Vital Signs Assessment: post-procedure vital signs reviewed and stable Respiratory status: spontaneous breathing, nonlabored ventilation, respiratory function stable and patient connected to nasal cannula oxygen Cardiovascular status: blood pressure returned to baseline and stable Postop Assessment: no apparent nausea or vomiting Anesthetic complications: no    Last Vitals:  Vitals:   10/27/19 1500 10/27/19 1514  BP: 128/80 124/86  Pulse: 62 62  Resp: 17 16  Temp:  36.4 C  SpO2: 100% 100%    Last Pain:  Vitals:   10/27/19 1514  PainSc: 0-No pain                 Jaskaran Dauzat L Raegan Winders

## 2019-11-04 ENCOUNTER — Telehealth: Payer: Self-pay | Admitting: Surgical

## 2019-11-04 ENCOUNTER — Encounter: Payer: Self-pay | Admitting: Surgical

## 2019-11-04 ENCOUNTER — Other Ambulatory Visit: Payer: Self-pay

## 2019-11-04 ENCOUNTER — Ambulatory Visit (INDEPENDENT_AMBULATORY_CARE_PROVIDER_SITE_OTHER): Payer: BC Managed Care – PPO | Admitting: Surgical

## 2019-11-04 DIAGNOSIS — M19012 Primary osteoarthritis, left shoulder: Secondary | ICD-10-CM

## 2019-11-04 DIAGNOSIS — M67814 Other specified disorders of tendon, left shoulder: Secondary | ICD-10-CM

## 2019-11-04 MED ORDER — TRAMADOL HCL 50 MG PO TABS: 50.0000 mg | ORAL_TABLET | Freq: Three times a day (TID) | ORAL | 0 refills | Status: DC | PRN

## 2019-11-04 NOTE — Telephone Encounter (Signed)
Pls advise pharmacy what patient can take. Thanks.

## 2019-11-04 NOTE — Telephone Encounter (Signed)
Received call from Tracy-Pharmacist with [facility] pharmacy she advised can not fill Rx (Tramadol) patient is taking Methocarbamol and Ambien. French Ana asked if the Rx could be written for a non-narcotic drug. The number to contact French Ana is [phone]

## 2019-11-05 ENCOUNTER — Encounter: Payer: Self-pay | Admitting: Surgical

## 2019-11-05 NOTE — Telephone Encounter (Signed)
Called the pharmacy and the patient today and took care of it.  Thanks!

## 2019-11-05 NOTE — Progress Notes (Signed)
Post-Op Visit Note   Patient: Stephanie Noble           Date of Birth: 03-07-1956           MRN: [id number] Visit Date: 11/04/2019 PCP: Mikey Kirschner, MD   Assessment & Plan:  Chief Complaint:  Chief Complaint  Patient presents with  . Left Shoulder - Routine Post Op   Visit Diagnoses:  1. Arthritis of left acromioclavicular joint   2. Biceps tendonosis of left shoulder     Plan: Patient is a 65 year old female presents s/p left shoulder arthroscopic distal clavicle excision with open biceps tenodesis.  Patient is in sling.  Sutures are intact.  She notes that she is in moderate pain and oxycodone is not providing relief to the point where she has stopped taking it.  Pain is improving since surgery however.  She does occasionally wake with pain at night.  Incisions are healing well.  On exam she has excellent range of motion with 25 degrees external rotation, 90 degrees abduction, 120 degrees forward flexion.  Axillary nerve is functioning well.  With oxycodone not helping, recommended that patient try tramadol and Celebrex combination.  Celebrex has helped in the past with her previous surgeries.  If she does not have significant relief from this, will try another commendation.  Patient may discontinue the sling.  Discouraged patient from lifting anything heavier than 5 pounds with the operative arm.  Follow-up in 4 weeks for clinical recheck.  Perioperative photographs were reviewed with the patient and her questions were answered.  Follow-Up Instructions: No follow-ups on file.   Orders:  No orders of the defined types were placed in this encounter.  Meds ordered this encounter  Medications  . traMADol (ULTRAM) 50 MG tablet    Sig: Take 1 tablet (50 mg total) by mouth every 8 (eight) hours as needed.    Dispense:  30 tablet    Refill:  0    Imaging: No results found.  PMFS History: Patient Active Problem List   Diagnosis Date Noted  . 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  PTSD (post-traumatic stress disorder) 04/13/2017  . Cognitive complaints 03/25/2017  . Hallux rigidus, left foot 09/15/2016  . Hallux rigidus, right foot 09/15/2016  . Pain in both feet 07/24/2016  . HNP (herniated nucleus pulposus), lumbar 04/16/2016  . Chronic neck pain 06/22/2015  . Arthritis 01/04/2013  . Insomnia 01/04/2013   Past Medical History:  Diagnosis Date  . Anxiety   . Asthma    uses albuterol as needed "mostly weather induced".ALbuterol and Ventolin inhaler as needed  . Chronic back pain   . Chronic neck pain   . Complication of anesthesia    post-op PEA arrest 04/16/16, thought due to respriatory arrest from sedation, narcotic, hypovolemia  . Degenerative disc disease    cervical and lumbar area  . Depression   . History of bronchitis 2016  . Insomnia    takes Ambien nightly  . Muscle spasm    takes Robaxin daily   . Pneumonia    hx of-2016  . PONV (postoperative nausea and vomiting)   . Weakness    numbness and tingling in legs    Family History  Problem Relation Age of Onset  . Dementia Mother   . Other Sister   . Alcohol abuse Other     Past Surgical History:  Procedure Laterality Date  . BACK SURGERY     x 3 lumbar  . CHOLECYSTECTOMY    .  DILATION AND CURETTAGE OF UTERUS     x 2  . KNEE SURGERY Left    x 2  . left knee arthroscopy      x 2  . POSTERIOR CERVICAL FUSION/FORAMINOTOMY  05/17/2012   Procedure: POSTERIOR CERVICAL FUSION/FORAMINOTOMY LEVEL 2;  Surgeon: Hewitt Shorts, MD;  Location: [facility];  Service: Neurosurgery;  Laterality: N/A;  cervical six to Thoracic One posterior cervical arthrodesis with instrumentation  . SHOULDER ARTHROSCOPY Left 10/27/2019   Procedure: left shoulder arthroscopy with arthroscopic vs open distal clavical excision;  Surgeon: Cammy Copa, MD;  Location: [facility];  Service: Orthopedics;  Laterality: Left;  . SHOULDER SURGERY     right  . SPINE SURGERY     x 5  . TOE FUSION Left 09/2016  .  TOTAL HIP ARTHROPLASTY Left 11/09/2018   Procedure: LEFT TOTAL HIP ARTHROPLASTY DIRECT ANTERIOR APPROACH;  Surgeon: Cammy Copa, MD;  Location: [facility];  Service: Orthopedics;  Laterality: Left;   Social History   Occupational History  . Not on file  Tobacco Use  . Smoking status: Never Smoker  . Smokeless tobacco: Never Used  Substance and Sexual Activity  . Alcohol use: No  . Drug use: No  . Sexual activity: Yes    Birth control/protection: Post-menopausal

## 2019-11-28 ENCOUNTER — Other Ambulatory Visit: Payer: Self-pay | Admitting: Family Medicine

## 2019-11-30 NOTE — Telephone Encounter (Signed)
Pt has appt on 12/02/19. Please advise. Thank you

## 2019-11-30 NOTE — Telephone Encounter (Signed)
Pt needs to get her gabapentin from her specialist that is prescribing her other pain medications.  Let me know when appt is and we can give her small course till her appt.    Thx.   Dr. Ladona Ridgel

## 2019-12-02 ENCOUNTER — Ambulatory Visit (INDEPENDENT_AMBULATORY_CARE_PROVIDER_SITE_OTHER): Payer: BC Managed Care – PPO | Admitting: Orthopedic Surgery

## 2019-12-02 ENCOUNTER — Encounter: Payer: Self-pay | Admitting: Orthopedic Surgery

## 2019-12-02 DIAGNOSIS — M19012 Primary osteoarthritis, left shoulder: Secondary | ICD-10-CM

## 2019-12-02 NOTE — Progress Notes (Signed)
Post-Op Visit Note   Patient: Stephanie Noble           Date of Birth: 06-Mar-1956           MRN: [id number] Visit Date: 12/02/2019 PCP: Merlyn Albert, MD   Assessment & Plan:  Chief Complaint:  Chief Complaint  Patient presents with  . Left Shoulder - Follow-up   Visit Diagnoses:  1. Arthritis of left acromioclavicular joint     Plan: Stephanie Noble is now about 5 weeks out left shoulder arthroscopy with biceps tenodesis and distal clavicle excision.  She feels about 80% normal.  On exam she has a little bit of what may be allergic reaction to the Vicryl sutures on the biceps tenodesis incision.  No infection appearance.  Range of motion is improving.  She cannot really lay on that side yet to sleep.  She is taking no medications except for naproxen.  Overall she is improving.  May take her about another 3 months to reach full improvement.  Continue with progressive increase in activity because at this time the biceps should be tethered down.  Follow-up with [place] as needed.  Follow-Up Instructions: No follow-ups on file.   Orders:  No orders of the defined types were placed in this encounter.  No orders of the defined types were placed in this encounter.   Imaging: No results found.  PMFS History: Patient Active Problem List   Diagnosis Date Noted  . 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 PTSD (post-traumatic stress disorder) 04/13/2017  . Cognitive complaints 03/25/2017  . Hallux rigidus, left foot 09/15/2016  . Hallux rigidus, right foot 09/15/2016  . Pain in both feet 07/24/2016  . HNP (herniated nucleus pulposus), lumbar 04/16/2016  . Chronic neck pain 06/22/2015  . Arthritis 01/04/2013  . Insomnia 01/04/2013   Past Medical History:  Diagnosis Date  . Anxiety   . Asthma    uses albuterol as needed "mostly weather induced".ALbuterol and Ventolin inhaler as needed  . Chronic back pain   . Chronic neck pain   . Complication of anesthesia    post-op PEA arrest 04/16/16,  thought due to respriatory arrest from sedation, narcotic, hypovolemia  . Degenerative disc disease    cervical and lumbar area  . Depression   . History of bronchitis 2016  . Insomnia    takes Ambien nightly  . Muscle spasm    takes Robaxin daily   . Pneumonia    hx of-2016  . PONV (postoperative nausea and vomiting)   . Weakness    numbness and tingling in legs    Family History  Problem Relation Age of Onset  . Dementia Mother   . Other Sister   . Alcohol abuse Other     Past Surgical History:  Procedure Laterality Date  . BACK SURGERY     x 3 lumbar  . CHOLECYSTECTOMY    . DILATION AND CURETTAGE OF UTERUS     x 2  . KNEE SURGERY Left    x 2  . left knee arthroscopy      x 2  . POSTERIOR CERVICAL FUSION/FORAMINOTOMY  05/17/2012   Procedure: POSTERIOR CERVICAL FUSION/FORAMINOTOMY LEVEL 2;  Surgeon: Hewitt Shorts, MD;  Location: [facility];  Service: Neurosurgery;  Laterality: N/A;  cervical six to Thoracic One posterior cervical arthrodesis with instrumentation  . SHOULDER ARTHROSCOPY Left 10/27/2019   Procedure: left shoulder arthroscopy with arthroscopic vs open distal clavical excision;  Surgeon: Cammy Copa, MD;  Location:  [facility] OR;  Service: Orthopedics;  Laterality: Left;  . SHOULDER SURGERY     right  . SPINE SURGERY     x 5  . TOE FUSION Left 09/2016  . TOTAL HIP ARTHROPLASTY Left 11/09/2018   Procedure: LEFT TOTAL HIP ARTHROPLASTY DIRECT ANTERIOR APPROACH;  Surgeon: Meredith Pel, MD;  Location: [facility];  Service: Orthopedics;  Laterality: Left;   Social History   Occupational History  . Not on file  Tobacco Use  . Smoking status: Never Smoker  . Smokeless tobacco: Never Used  Substance and Sexual Activity  . Alcohol use: No  . Drug use: No  . Sexual activity: Yes    Birth control/protection: Post-menopausal

## 2019-12-21 ENCOUNTER — Telehealth: Payer: Self-pay | Admitting: Family Medicine

## 2019-12-21 NOTE — Telephone Encounter (Signed)
Appt to discuss. Thx. Dr. Ladona Ridgel

## 2019-12-21 NOTE — Telephone Encounter (Signed)
Pt had virtual visit with Dr. Brett Canales back in April and they discussed increasing her gabapentin (NEURONTIN) 300 MG capsule she would like that does that was discussed with Dr. Brett Canales called in. I advised pt she would need to establish care with Dr. Ladona Ridgel before Dr. Ladona Ridgel could authorize changes in doses. Pt wanted a note put back since Dr. Brett Canales already approved it.

## 2019-12-21 NOTE — Telephone Encounter (Signed)
Seen 4/20 and discussed this med. Pt is currently taking gabapentin 300mg  one qam, 2 at lunch, 3 qhs and states dr told her she could increase to 2qam, 2 at lunch and 3 qhs. Pt would like to do this and maryann explained to pt that she would need visit to discuss but pt wanted note sent back to ask dr Brett Canales

## 2019-12-22 MED ORDER — GABAPENTIN 300 MG PO CAPS: ORAL_CAPSULE | ORAL | 0 refills | Status: DC

## 2019-12-22 NOTE — Telephone Encounter (Signed)
Left message to return call 

## 2019-12-22 NOTE — Telephone Encounter (Signed)
Pt.notified

## 2019-12-22 NOTE — Telephone Encounter (Signed)
Sent. Refill gabapentin.  Dr. Karie Schwalbe

## 2019-12-22 NOTE — Telephone Encounter (Signed)
Pt scheduled for 7/2 but is requesting refill on medication until appt.

## 2019-12-30 ENCOUNTER — Other Ambulatory Visit: Payer: Self-pay

## 2019-12-30 ENCOUNTER — Encounter: Payer: Self-pay | Admitting: Family Medicine

## 2019-12-30 ENCOUNTER — Ambulatory Visit (INDEPENDENT_AMBULATORY_CARE_PROVIDER_SITE_OTHER): Payer: 59 | Admitting: Family Medicine

## 2019-12-30 ENCOUNTER — Telehealth: Payer: Self-pay | Admitting: Family Medicine

## 2019-12-30 VITALS — BP 136/84 | HR 74 | Temp 94.6°F | Ht 66.0 in | Wt 153.0 lb

## 2019-12-30 DIAGNOSIS — F5101 Primary insomnia: Secondary | ICD-10-CM | POA: Diagnosis not present

## 2019-12-30 DIAGNOSIS — Z9889 Other specified postprocedural states: Secondary | ICD-10-CM | POA: Diagnosis not present

## 2019-12-30 DIAGNOSIS — M542 Cervicalgia: Secondary | ICD-10-CM | POA: Diagnosis not present

## 2019-12-30 DIAGNOSIS — G8929 Other chronic pain: Secondary | ICD-10-CM | POA: Diagnosis not present

## 2019-12-30 HISTORY — DX: Other specified postprocedural states: Z98.890

## 2019-12-30 MED ORDER — GABAPENTIN 300 MG PO CAPS: ORAL_CAPSULE | ORAL | 2 refills | Status: DC

## 2019-12-30 MED ORDER — ZOLPIDEM TARTRATE 10 MG PO TABS: 10.0000 mg | ORAL_TABLET | Freq: Every day | ORAL | 2 refills | Status: DC

## 2019-12-30 NOTE — Progress Notes (Signed)
Patient ID: Stephanie Noble, female    DOB: 1956/02/23, 64 y.o.   MRN: [id number]   Chief Complaint  Patient presents with  . Discussion    Pt here to discuss changing Stephanie Noble 300 mg to take 2 in the Am, 2 @ noon and 3 @ bedtime.    Subjective:    HPI  Pt seen for f/u on chronic pain, neuropathy, and insomnia. Pt has 17 surgeries on bones and joints. Both great toes, 2 knee scopes, both shoulders, hip prepalcement, back surgeries. Has had 6 spinal surgeries (2 cervical and 4, lumbar) Having some nerve pain in lower legs.  Went to pain clinic in the past. The compression of discs and some damage to nerves and given gabapentin and cymbalta.  Taking robaxin also. Pt wanting to increase the morning dose of gabapentin.  Spoke with Dr. Brett Canales about this on last visit, they were considering inc the morning dose to 600mg .  Insomnia- Sleep-Average in 6hrs per night or 7 hours. Tried trazodone in past for sleep.  Hasn't tried amytriptaline or nortriptyline.  Pt has insomnia from a child. Since then pt stating she has been on since 2013. Has been on 10mg  ambien and then 5mg , then went back up to 10mg .   Medical History Stephanie Noble has a past medical history of Anxiety, Asthma, Chronic back pain, Chronic neck pain, Complication of anesthesia, Degenerative disc disease, Depression, History of bronchitis (2016), Insomnia, Muscle spasm, Pneumonia, PONV (postoperative nausea and vomiting), and Weakness.   Outpatient Encounter Medications as of 12/30/2019  Medication Sig  . albuterol (PROVENTIL) (2.5 MG/3ML) 0.083% nebulizer solution Take 3 mLs (2.5 mg total) by nebulization every 4 (four) hours as needed for wheezing or shortness of breath.  albuterol (VENTOLIN HFA) 108 (90 Base) MCG/ACT inhaler Inhale 2 sprays QID prn wheezing (Patient taking differently: Inhale 2 puffs into the lungs 4 (four) times daily as needed for wheezing or shortness of breath. )  . celecoxib (CELEBREX) 200 MG  capsule Take 1 capsule (200 mg total) by mouth daily.  . DULoxetine (CYMBALTA) 20 MG capsule Take 2 capsules (40 mg total) by mouth daily.  . fluticasone (FLOVENT HFA) 44 MCG/ACT inhaler INHALE 2 PUFFS TWICE A DAY  . gabapentin (NEURONTIN) 300 MG capsule TAKE 2 CAPSULE IN THE MORNING , 2 CAPS AT LUNCH AND 3 CAPS AT BEDTIME  . methocarbamol (ROBAXIN) 750 MG tablet TAKE 1 TABLET BY MOUTH THREE TIMES A DAY AS NEEDED  . zolpidem (AMBIEN) 10 MG tablet Take 1 tablet (10 mg total) by mouth at bedtime.  . [DISCONTINUED] gabapentin (NEURONTIN) 300 MG capsule TAKE 1 CAPSULE IN THE MORNING , 2 CAPS AT LUNCH AND 3 CAPS AT BEDTIME  . [DISCONTINUED] zolpidem (AMBIEN) 10 MG tablet Take 1 tablet (10 mg total) by mouth at bedtime.  . [DISCONTINUED] oxyCODONE (ROXICODONE) 5 MG immediate release tablet Take 1 tablet (5 mg total) by mouth every 4 (four) hours as needed.  . [DISCONTINUED] traMADol (ULTRAM) 50 MG tablet Take 1 tablet (50 mg total) by mouth every 8 (eight) hours as needed.   No facility-administered encounter medications on file as of 12/30/2019.     Review of Systems  Constitutional: Negative for chills and fever.  HENT: Negative for congestion, rhinorrhea and sore throat.   Respiratory: Negative for cough, shortness of breath and wheezing.   Cardiovascular: Negative for chest pain and leg swelling.  Gastrointestinal: Negative for abdominal pain, diarrhea, nausea and vomiting.  Genitourinary: Negative for dysuria and  frequency.  Musculoskeletal: Negative for arthralgias and back pain.       +leg pain-chronic  Skin: Negative for rash.  Neurological: Negative for dizziness, weakness and headaches.       +skin crawling feeling-chronic  Psychiatric/Behavioral: Positive for sleep disturbance. Negative for confusion and dysphoric mood. The patient is not nervous/anxious.      Vitals BP 136/84   Pulse 74   Temp (!) 94.6 F (34.8 C)   Ht 5\' 6"  (1.676 m)   Wt 153 lb (69.4 kg)   SpO2 94%   BMI  24.69 kg/m   Objective:   Physical Exam Vitals and nursing note reviewed.  Constitutional:      General: She is not in acute distress.    Appearance: Normal appearance.  HENT:     Head: Normocephalic and atraumatic.  Eyes:     Extraocular Movements: Extraocular movements intact.     Conjunctiva/sclera: Conjunctivae normal.     Pupils: Pupils are equal, round, and reactive to light.  Cardiovascular:     Rate and Rhythm: Normal rate and regular rhythm.     Pulses: Normal pulses.     Heart sounds: Normal heart sounds.  Pulmonary:     Effort: Pulmonary effort is normal. No respiratory distress.     Breath sounds: Normal breath sounds. No wheezing, rhonchi or rales.  Musculoskeletal:        General: Normal range of motion.     Right lower leg: No edema.     Left lower leg: No edema.  Skin:    General: Skin is warm and dry.     Findings: No lesion or rash.  Neurological:     General: No focal deficit present.     Mental Status: She is alert and oriented to person, place, and time.     Cranial Nerves: No cranial nerve deficit.  Psychiatric:        Mood and Affect: Mood normal.        Behavior: Behavior normal.      Assessment and Plan   1. Primary insomnia - zolpidem (AMBIEN) 10 MG tablet; Take 1 tablet (10 mg total) by mouth at bedtime.  Dispense: 30 tablet; Refill: 2  2. Chronic neck pain - gabapentin (NEURONTIN) 300 MG capsule; TAKE 2 CAPSULE IN THE MORNING , 2 CAPS AT LUNCH AND 3 CAPS AT BEDTIME  Dispense: 180 capsule; Refill: 2  3. History of lumbar surgery - gabapentin (NEURONTIN) 300 MG capsule; TAKE 2 CAPSULE IN THE MORNING , 2 CAPS AT LUNCH AND 3 CAPS AT BEDTIME  Dispense: 180 capsule; Refill: 2  4. History of cervical spinal surgery - gabapentin (NEURONTIN) 300 MG capsule; TAKE 2 CAPSULE IN THE MORNING , 2 CAPS AT LUNCH AND 3 CAPS AT BEDTIME  Dispense: 180 capsule; Refill: 2    Will give 3 month refills of ambien and gabapentin and pt needs to return q78mo  for refills.  Offered neurology referral and pt declining at this time.  F/u 57mo.

## 2019-12-30 NOTE — Telephone Encounter (Signed)
Pt contacted. Pt states Dr.Taylor's approach was not well; disappointed and dismayed. No meeting of the minds. Did not discuss sleeping (quality of sleep and hours of sleep). Informed pt that we are unable to switch providers due to potentially causing a conflict. Pt also informed that Dr.Scott is currently not accepting new patients. Pt verbalized understanding.

## 2019-12-30 NOTE — Telephone Encounter (Signed)
Husband calling back upset because wife is upset after her visit with Dr. Ladona Ridgel this morning. He is trying to say that Dr. Ladona Ridgel doesn't know their history and all the medicines Dr. Brett Canales had them on.  I tried to explained to him that Dr. Ladona Ridgel has full access of the charts and that she is aware of past medical history and that she is following the guideline for certain medications.  And that patient's have to give her a chance to get to know them and what works best for each patient. He did not want to listen and wanted himself and his wife to be switched over to Dr. Lorin Picket and I told him that Dr. Lorin Picket was not taking on any of Dr. Soyla Dryer patients.  He insisted that I put a note back anyways to ask.

## 2020-01-02 ENCOUNTER — Other Ambulatory Visit: Payer: Self-pay | Admitting: Family Medicine

## 2020-01-03 NOTE — Telephone Encounter (Signed)
1 refill Further refills will be per Dr. Ladona Ridgel

## 2020-04-02 ENCOUNTER — Encounter: Payer: Self-pay | Admitting: Family Medicine

## 2020-04-02 ENCOUNTER — Telehealth (INDEPENDENT_AMBULATORY_CARE_PROVIDER_SITE_OTHER): Payer: 59 | Admitting: Family Medicine

## 2020-04-02 ENCOUNTER — Other Ambulatory Visit: Payer: Self-pay

## 2020-04-02 ENCOUNTER — Telehealth: Payer: Self-pay | Admitting: *Deleted

## 2020-04-02 DIAGNOSIS — F5101 Primary insomnia: Secondary | ICD-10-CM | POA: Diagnosis not present

## 2020-04-02 DIAGNOSIS — G6289 Other specified polyneuropathies: Secondary | ICD-10-CM

## 2020-04-02 DIAGNOSIS — Z9889 Other specified postprocedural states: Secondary | ICD-10-CM

## 2020-04-02 NOTE — Progress Notes (Signed)
Patient ID: Stephanie Noble, female    DOB: 1955-07-11, 64 y.o.   MRN: [id number]   Virtual Visit via Video Note  I connected with Stephanie Noble on 04/02/20 at  9:00 AM EDT by a video enabled telemedicine application and verified that I am speaking with the correct person using two identifiers.  Location: Patient: home Provider: office    I discussed the limitations of evaluation and management by telemedicine and the availability of in person appointments. The patient expressed understanding and agreed to proceed.   Chief Complaint  Patient presents with  . Insomnia    Typically not going to sleep until after 2:00AM. No improvement.   . chronic neck pain    Pain has increased slightly.   Subjective:    HPI Pt stating doing well, however insomnia worsening. Sleep is not going as well.  Used to be able to be alseep by from laying down. Hard to get out of bed at times. At 6-7am used to get up without alarm. Now sleeping in till 8am.  Hard to get up.  Pt still on cymbalta 40mg  and robaxin. Started cymbalta in 2018 after surgery. Dr. 2019 had pt increase the morning gabapentin, so still taking 300mg  in am. Night time is worst time. Wanting to cont just 1am, 2 at lunch, and 3 at bedtime. Having foot pain, burning pain. Legs from knees down- numbness/falling asleep feeling.  Not intolerable and able to get through,  At night pain is 7/10. W/o gabapentin would be 10/10. Taking aleve and taking prn. 3 days per week taking taking 1 tab each.  Surgeon said, they did what they could do on the surgical stand point.  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  Medical History Stephanie Noble has a past medical history of Anxiety, Asthma, Chronic back pain, Chronic neck pain, Complication of anesthesia, Degenerative disc disease, Depression, History of bronchitis (2016), Insomnia, Muscle spasm, Pneumonia, PONV (postoperative nausea and vomiting), and Weakness.   Outpatient  Encounter Medications as of 04/02/2020  Medication Sig  . albuterol (PROVENTIL) (2.5 MG/3ML) 0.083% nebulizer solution Take 3 mLs (2.5 mg total) by nebulization every 4 (four) hours as needed for wheezing or shortness of breath.  05-14-1988 albuterol (VENTOLIN HFA) 108 (90 Base) MCG/ACT inhaler Inhale 2 sprays QID prn wheezing (Patient taking differently: Inhale 2 puffs into the lungs 4 (four) times daily as needed for wheezing or shortness of breath. )  . DULoxetine (CYMBALTA) 20 MG capsule Take 2 capsules (40 mg total) by mouth daily.  . fluticasone (FLOVENT HFA) 44 MCG/ACT inhaler INHALE 2 PUFFS TWICE A DAY  . gabapentin (NEURONTIN) 300 MG capsule TAKE 2 CAPSULE IN THE MORNING , 2 CAPS AT LUNCH AND 3 CAPS AT BEDTIME  . methocarbamol (ROBAXIN) 750 MG tablet TAKE 1 TABLET BY MOUTH THREE TIMES A DAY AS NEEDED  . zolpidem (AMBIEN) 10 MG tablet Take 1 tablet (10 mg total) by mouth at bedtime.  . [DISCONTINUED] celecoxib (CELEBREX) 200 MG capsule Take 1 capsule (200 mg total) by mouth daily.   No facility-administered encounter medications on file as of 04/02/2020.     Review of Systems  Constitutional: Negative for chills and fever.  HENT: Negative for congestion, rhinorrhea and sore throat.   Respiratory: Negative for cough, shortness of breath and wheezing.   Cardiovascular: Negative for chest pain and leg swelling.  Gastrointestinal: Negative for abdominal pain, diarrhea, nausea and vomiting.  Genitourinary: Negative for dysuria and frequency.  Musculoskeletal: Positive for back  pain (chronic ). Negative for arthralgias.  Skin: Negative for rash.  Neurological: Positive for numbness (lower legs). Negative for dizziness, weakness and headaches.  Psychiatric/Behavioral: Positive for sleep disturbance (controlled with meds).     Vitals There were no vitals taken for this visit.  Objective:   Physical Exam  No PE due to phone visit.  Assessment and Plan   1. Primary insomnia  2. History  of lumbar surgery  3. Other polyneuropathy    Lumbar radiculopathy and neuropathy- h/o lumbar surgeries- Suboptimal pain control. Pt wanting to keep things as is with medications at this time. Offered to increase cymbalta from 40mg  to 60mg , pt wanting to wait.  Not feeling her pain is very severe at this time. Also offered pt to take mobic or diclofenac for pain prn.  Pt stating just wanting to take aleve prn.  Insomnia- taking ambien, not working as well as it used to.  Advising to try to do sleep hygiene.  And take aleve for pain at bedtime with other meds to see if helping with her sleep.  F/u 27mo or prn.  Follow Up Instructions:    I discussed the assessment and treatment plan with the patient. The patient was provided an opportunity to ask questions and all were answered. The patient agreed with the plan and demonstrated an understanding of the instructions.   The patient was advised to call back or seek an in-person evaluation if the symptoms worsen or if the condition fails to improve as anticipated.  I provided 12 minutes of non-face-to-face time during this encounter.

## 2020-04-02 NOTE — Telephone Encounter (Signed)
Ms. shakenya, stoneberg are scheduled for a virtual visit with your provider today.    Just as we do with appointments in the office, we must obtain your consent to participate.  Your consent will be active for this visit and any virtual visit you may have with one of our providers in the next 365 days.    If you have a MyChart account, I can also send a copy of this consent to you electronically.  All virtual visits are billed to your insurance company just like a traditional visit in the office.  As this is a virtual visit, video technology does not allow for your provider to perform a traditional examination.  This may limit your provider's ability to fully assess your condition.  If your provider identifies any concerns that need to be evaluated in person or the need to arrange testing such as labs, EKG, etc, we will make arrangements to do so.    Although advances in technology are sophisticated, we cannot ensure that it will always work on either your end or our end.  If the connection with a video visit is poor, we may have to switch to a telephone visit.  With either a video or telephone visit, we are not always able to ensure that we have a secure connection.   I need to obtain your verbal consent now.   Are you willing to proceed with your visit today?   [facility] has provided verbal consent on 04/02/2020 for a virtual visit (video or telephone).   Haze Rushing, LPN [id number]/[id number]  8:46 AM

## 2020-04-16 ENCOUNTER — Other Ambulatory Visit: Payer: Self-pay | Admitting: Family Medicine

## 2020-04-16 DIAGNOSIS — Z9889 Other specified postprocedural states: Secondary | ICD-10-CM

## 2020-04-16 DIAGNOSIS — M542 Cervicalgia: Secondary | ICD-10-CM

## 2020-04-16 DIAGNOSIS — G8929 Other chronic pain: Secondary | ICD-10-CM

## 2020-04-17 ENCOUNTER — Telehealth: Payer: Self-pay | Admitting: *Deleted

## 2020-04-17 DIAGNOSIS — G8929 Other chronic pain: Secondary | ICD-10-CM

## 2020-04-17 DIAGNOSIS — Z9889 Other specified postprocedural states: Secondary | ICD-10-CM

## 2020-04-17 MED ORDER — GABAPENTIN 300 MG PO CAPS: ORAL_CAPSULE | ORAL | 2 refills | Status: DC

## 2020-04-17 NOTE — Telephone Encounter (Signed)
Pt.notified

## 2020-04-17 NOTE — Telephone Encounter (Signed)
Pt states she has been requesting refill on gabapentin from pharm and has not been able to get filled. States she had a recent visit and not sure why she can't get refilled. Was up all night with neuropathy and would like to see if she can get refills sent in today [facility] [place].

## 2020-04-18 ENCOUNTER — Other Ambulatory Visit: Payer: Self-pay | Admitting: Family Medicine

## 2020-04-23 ENCOUNTER — Telehealth: Payer: Self-pay | Admitting: *Deleted

## 2020-04-23 NOTE — Telephone Encounter (Signed)
Appears pt has new pt appt with Dr. Allena Katz.  So we will give refills till her new pt visit.   Thanks,   Dr. Ladona Ridgel

## 2020-04-23 NOTE — Telephone Encounter (Addendum)
Patient states she is switching physicians and has enough medication till her appt with her new physician 05/01/2020

## 2020-04-23 NOTE — Telephone Encounter (Signed)
Patient called and doesn't understand why she is having such a hard time getting her chronic meds refilled. Patient states she is on Robaxin 750 mg 3 times a day every day and needs 90 tablets for a 30 day supply. Patient states this is not a PRN med but  Chromic med she takes 3 a day everyday for years- her refill last week was only for 30 tablets and that is only a 10 day supply. Patient states she has had multiple spinal infusions and neuropathy and doesn't take any pain meds but does the gabapentin and robaxin everyday. Patient would like a new 30 day script sent to [facility] [place].

## 2020-05-01 ENCOUNTER — Encounter: Payer: Self-pay | Admitting: Internal Medicine

## 2020-05-01 ENCOUNTER — Ambulatory Visit (INDEPENDENT_AMBULATORY_CARE_PROVIDER_SITE_OTHER): Payer: 59 | Admitting: Internal Medicine

## 2020-05-01 ENCOUNTER — Other Ambulatory Visit: Payer: Self-pay

## 2020-05-01 VITALS — BP 125/84 | HR 72 | Temp 97.8°F | Resp 18 | Ht 66.0 in | Wt 151.1 lb

## 2020-05-01 DIAGNOSIS — J45909 Unspecified asthma, uncomplicated: Secondary | ICD-10-CM | POA: Insufficient documentation

## 2020-05-01 DIAGNOSIS — Z7689 Persons encountering health services in other specified circumstances: Secondary | ICD-10-CM | POA: Insufficient documentation

## 2020-05-01 DIAGNOSIS — M4800 Spinal stenosis, site unspecified: Secondary | ICD-10-CM

## 2020-05-01 DIAGNOSIS — Z1211 Encounter for screening for malignant neoplasm of colon: Secondary | ICD-10-CM

## 2020-05-01 DIAGNOSIS — J4520 Mild intermittent asthma, uncomplicated: Secondary | ICD-10-CM

## 2020-05-01 DIAGNOSIS — Z1231 Encounter for screening mammogram for malignant neoplasm of breast: Secondary | ICD-10-CM

## 2020-05-01 DIAGNOSIS — F4310 Post-traumatic stress disorder, unspecified: Secondary | ICD-10-CM | POA: Diagnosis not present

## 2020-05-01 DIAGNOSIS — Z8674 Personal history of sudden cardiac arrest: Secondary | ICD-10-CM

## 2020-05-01 DIAGNOSIS — M5126 Other intervertebral disc displacement, lumbar region: Secondary | ICD-10-CM

## 2020-05-01 NOTE — Progress Notes (Signed)
New Patient Office Visit  Subjective:  Patient ID: Stephanie Noble, female    DOB: 15-May-1956  Age: 64 y.o. MRN: [id number]  CC:  Chief Complaint  Patient presents with  . New Patient (Initial Visit)    new pt has been having trouble out of neck since shoulder surgery in april is not getting any better also feels like she has choking sensation since 2014 for neck fusion and this is only getting worse     HPI Stephanie Noble is a 64 year old female with PMH of cervical spinal stenosis s/p fusion surgery, lumbar disc herniation s/p surgery, history of cardiac arrest related to operative analgesia and asthma presents for establishing care.  Patient has been having chronic neck pain, which is constant, variable in intensity, worse with movement and is associated with numbness and weakness in bilateral upper extremities.  Patient has had fusion surgery in 2013 and 2014.  Patient has not followed up with spine surgery for last 2 to 3 years.  Patient also complains of lower back pain, which refers to the buttocks area.  Patient takes Robaxin for muscle spasms.  Patient takes gabapentin and Cymbalta for neuropathy pain.  Patient used to see pain specialist in the past.  Patient has a history of cardiac arrest related to operative analgesia according to her.  Patient is afraid to take opiod medications because of it.  Patient currently denies dizziness, lightheadedness, chest pain, dyspnea or palpitations.  Patient has a history of asthma.  She takes Flovent and albuterol inhaler as needed.  Patient has a longstanding history of insomnia, for which she has been taking Ambien.  She denies any anhedonia, recent weight or appetite changes or suicidal ideation.  Patient has not had colonoscopy yet. Patient prefers to get Cologuard. Last Mammography in 2017. Last PAP smear in 2020.  Patient has had both doses of COVID and flu vaccine.  Past Medical History:  Diagnosis Date  . Anxiety   .  Asthma    uses albuterol as needed "mostly weather induced".ALbuterol and Ventolin inhaler as needed  . Chronic back pain   . Chronic neck pain   . Complication of anesthesia    post-op PEA arrest 04/16/16, thought due to respriatory arrest from sedation, narcotic, hypovolemia  . Degenerative disc disease    cervical and lumbar area  . Depression   . History of bronchitis 2016  . History of cervical spinal surgery 12/30/2019  . History of lumbar surgery 12/30/2019  . Insomnia    takes Ambien nightly  . Muscle spasm    takes Robaxin daily   . Pneumonia    hx of-2016  . PONV (postoperative nausea and vomiting)   . Weakness    numbness and tingling in legs    Past Surgical History:  Procedure Laterality Date  . BACK SURGERY     x 3 lumbar  . CHOLECYSTECTOMY    . DILATION AND CURETTAGE OF UTERUS     x 2  . KNEE SURGERY Left    x 2  . left knee arthroscopy      x 2  . POSTERIOR CERVICAL FUSION/FORAMINOTOMY  05/17/2012   Procedure: POSTERIOR CERVICAL FUSION/FORAMINOTOMY LEVEL 2;  Surgeon: Hosie Spangle, MD;  Location: [facility];  Service: Neurosurgery;  Laterality: N/A;  cervical six to Thoracic One posterior cervical arthrodesis with instrumentation  . SHOULDER ARTHROSCOPY Left 10/27/2019   Procedure: left shoulder arthroscopy with arthroscopic vs open distal clavical excision;  Surgeon: Marcene Duos  Nicki Reaper, MD;  Location: [facility];  Service: Orthopedics;  Laterality: Left;  . SHOULDER SURGERY     right  . SPINE SURGERY     x 5  . TOE FUSION Left 09/2016  . TOTAL HIP ARTHROPLASTY Left 11/09/2018   Procedure: LEFT TOTAL HIP ARTHROPLASTY DIRECT ANTERIOR APPROACH;  Surgeon: Meredith Pel, MD;  Location: [facility];  Service: Orthopedics;  Laterality: Left;    Family History  Problem Relation Age of Onset  . Dementia Mother   . Other Sister   . Alcohol abuse Other     Social History   Socioeconomic History  . Marital status: Married    Spouse name: Not on file  .  Number of children: Not on file  . Years of education: Not on file  . Highest education level: Not on file  Occupational History  . Not on file  Tobacco Use  . Smoking status: Never Smoker  . Smokeless tobacco: Never Used  Vaping Use  . Vaping Use: Never used  Substance and Sexual Activity  . Alcohol use: No  . Drug use: No  . Sexual activity: Yes    Birth control/protection: Post-menopausal  Other Topics Concern  . Not on file  Social History Narrative  . Not on file   Social Determinants of Health   Financial Resource Strain:   . Difficulty of Paying Living Expenses: Not on file  Food Insecurity:   . Worried About Charity fundraiser in the Last Year: Not on file  . Ran Out of Food in the Last Year: Not on file  Transportation Needs:   . Lack of Transportation (Medical): Not on file  . Lack of Transportation (Non-Medical): Not on file  Physical Activity:   . Days of Exercise per Week: Not on file  . Minutes of Exercise per Session: Not on file  Stress:   . Feeling of Stress : Not on file  Social Connections:   . Frequency of Communication with Friends and Family: Not on file  . Frequency of Social Gatherings with Friends and Family: Not on file  . Attends Religious Services: Not on file  . Active Member of Clubs or Organizations: Not on file  . Attends Archivist Meetings: Not on file  . Marital Status: Not on file  Intimate Partner Violence:   . Fear of Current or Ex-Partner: Not on file  . Emotionally Abused: Not on file  . Physically Abused: Not on file  . Sexually Abused: Not on file    ROS Review of Systems  Constitutional: Negative for chills and fever.  HENT: Negative for congestion, sinus pressure, sinus pain and sore throat.   Eyes: Negative for pain and discharge.  Respiratory: Negative for cough and shortness of breath.   Cardiovascular: Negative for chest pain and palpitations.  Gastrointestinal: Negative for abdominal pain,  constipation, diarrhea, nausea and vomiting.  Endocrine: Negative for polydipsia and polyuria.  Genitourinary: Negative for dysuria and hematuria.  Musculoskeletal: Positive for back pain and neck pain. Negative for neck stiffness.  Skin: Negative for rash.  Neurological: Positive for weakness (B/l UE) and numbness (B/l UE). Negative for dizziness and headaches.  Psychiatric/Behavioral: Negative for agitation and behavioral problems.    Objective:   Today's Vitals: BP 125/84 (BP Location: Right Arm, Patient Position: Sitting, Cuff Size: Normal)   Pulse 72   Temp 97.8 F (36.6 C) (Temporal)   Resp 18   Ht _0  (1.676 m)   Wt 151  lb 1.9 oz (68.5 kg)   SpO2 99%   BMI 24.39 kg/m   Physical Exam Vitals reviewed.  Constitutional:      General: She is not in acute distress.    Appearance: She is not diaphoretic.  HENT:     Head: Normocephalic and atraumatic.     Nose: Nose normal.     Mouth/Throat:     Mouth: Mucous membranes are moist.  Eyes:     General: No scleral icterus.    Extraocular Movements: Extraocular movements intact.     Pupils: Pupils are equal, round, and reactive to light.  Neck:     Comments: Well-healed scar in the cervical (neck) area Cardiovascular:     Rate and Rhythm: Normal rate and regular rhythm.     Pulses: Normal pulses.     Heart sounds: Normal heart sounds. No murmur heard.  No friction rub. No gallop.   Pulmonary:     Breath sounds: Normal breath sounds. No wheezing or rales.  Abdominal:     Palpations: Abdomen is soft.     Tenderness: There is no abdominal tenderness.  Musculoskeletal:     Cervical back: Neck supple. No tenderness.     Right lower leg: No edema.     Left lower leg: No edema.  Skin:    General: Skin is warm.     Findings: No rash.  Neurological:     General: No focal deficit present.     Mental Status: She is alert and oriented to person, place, and time.     Sensory: No sensory deficit.     Motor: No weakness.   Psychiatric:        Mood and Affect: Mood normal.        Behavior: Behavior normal.      Assessment & Plan:   Problem List Items Addressed This Visit      Encounter to establish care - Primary   Care established Previous chart reviewed History and medications reviewed with the patient     Relevant Orders  CBC with Differential  CMP14+EGFR  Lipid Profile  TSH  Vitamin D (25 hydroxy)    Respiratory   Asthma    Uses Albuterol inhaler PRN, uses it occasionally Continue Flovent inhaler        Musculoskeletal and Integument   HNP (herniated nucleus pulposus), lumbar    S/p lumbar spine surgery Has sciatica pain, referral to Spine surgery Continue Robaxin for muscle spasms        Other   PTSD (post-traumatic stress disorder)    Stable Has insomnia, takes Ambien         Central spinal stenosis - Cervical    S/p fusion surgery in 2013 and 2014 Has recurrent neck pain with numbness and weakness of b/l UE Continue Gabapentin and Cymbalta Referral to Spine surgery       Relevant Orders   Ambulatory referral to Spine Surgery   H/O cardiac arrest    After surgery, was related to analgesia during the surgery according to the patient No active complaints of chest pain, dyspnea or palpitations.      Special screening for malignant neoplasms, colon   Relevant Orders   Cologuard   Screening mammogram for breast cancer   Relevant Orders   MM Digital Screening      Outpatient Encounter Medications as of 05/01/2020  Medication Sig  . albuterol (PROVENTIL) (2.5 MG/3ML) 0.083% nebulizer solution Take 3 mLs (2.5 mg total) by nebulization every 4 (  four) hours as needed for wheezing or shortness of breath.  Marland Kitchen albuterol (VENTOLIN HFA) 108 (90 Base) MCG/ACT inhaler Inhale 2 sprays QID prn wheezing (Patient taking differently: Inhale 2 puffs into the lungs 4 (four) times daily as needed for wheezing or shortness of breath. )  . DULoxetine (CYMBALTA) 20 MG capsule Take 2  capsules (40 mg total) by mouth daily.  . fluticasone (FLOVENT HFA) 44 MCG/ACT inhaler INHALE 2 PUFFS TWICE A DAY  . gabapentin (NEURONTIN) 300 MG capsule TAKE 2 CAPSULE IN THE MORNING , 2 CAPS AT LUNCH AND 3 CAPS AT BEDTIME  . methocarbamol (ROBAXIN) 750 MG tablet TAKE 1 TABLET BY MOUTH THREE TIMES A DAY AS NEEDED  . zolpidem (AMBIEN) 10 MG tablet Take 1 tablet (10 mg total) by mouth at bedtime.   No facility-administered encounter medications on file as of 05/01/2020.    Follow-up: Return in about 3 months (around 08/01/2020).   Lindell Spar, MD

## 2020-05-01 NOTE — Assessment & Plan Note (Signed)
Uses Albuterol inhaler PRN, uses it occasionally Continue Flovent inhaler

## 2020-05-01 NOTE — Assessment & Plan Note (Signed)
After surgery, was related to analgesia during the surgery according to the patient No active complaints of chest pain, dyspnea or palpitations.

## 2020-05-01 NOTE — Assessment & Plan Note (Signed)
Care established Previous chart reviewed History and medications reviewed with the patient 

## 2020-05-01 NOTE — Assessment & Plan Note (Signed)
S/p fusion surgery in 2013 and 2014 Has recurrent neck pain with numbness and weakness of b/l UE Continue Gabapentin and Cymbalta Referral to Spine surgery

## 2020-05-01 NOTE — Assessment & Plan Note (Signed)
Stable Has insomnia, takes Ambien 

## 2020-05-01 NOTE — Patient Instructions (Signed)
Please continue to take medications as prescribed.  You are being referred to spine surgery for evaluation of neck pain related to cervical spinal stenosis.  Please continue to use inhalers as needed for shortness of breath or wheezing.  Please carry albuterol inhaler with you all the time.  You are being scheduled for mammogram for breast cancer screening.  You are being scheduled for Cologuard for colon cancer screening.  It was a pleasure to meet you today.  We consider it our privilege to take care of you.

## 2020-05-01 NOTE — Assessment & Plan Note (Signed)
S/p lumbar spine surgery Has sciatica pain, referral to Spine surgery Continue Robaxin for muscle spasms

## 2020-05-09 ENCOUNTER — Other Ambulatory Visit: Payer: Self-pay | Admitting: Neurological Surgery

## 2020-05-09 ENCOUNTER — Other Ambulatory Visit (HOSPITAL_COMMUNITY): Payer: Self-pay | Admitting: Neurological Surgery

## 2020-05-09 DIAGNOSIS — M4722 Other spondylosis with radiculopathy, cervical region: Secondary | ICD-10-CM

## 2020-05-11 ENCOUNTER — Ambulatory Visit (HOSPITAL_COMMUNITY): Payer: 59

## 2020-05-16 ENCOUNTER — Ambulatory Visit (HOSPITAL_COMMUNITY)
Admission: RE | Admit: 2020-05-16 | Discharge: 2020-05-16 | Disposition: A | Discharge status: Home / Self Care | Payer: 59 | Source: Ambulatory Visit | Attending: Neurological Surgery | Admitting: Neurological Surgery

## 2020-05-16 ENCOUNTER — Other Ambulatory Visit: Payer: Self-pay

## 2020-05-16 DIAGNOSIS — M4722 Other spondylosis with radiculopathy, cervical region: Secondary | ICD-10-CM | POA: Diagnosis not present

## 2020-05-29 ENCOUNTER — Telehealth: Payer: Self-pay

## 2020-05-29 ENCOUNTER — Other Ambulatory Visit: Payer: Self-pay | Admitting: *Deleted

## 2020-05-29 DIAGNOSIS — F5101 Primary insomnia: Secondary | ICD-10-CM

## 2020-05-29 MED ORDER — METHOCARBAMOL 750 MG PO TABS: 750.0000 mg | ORAL_TABLET | Freq: Three times a day (TID) | ORAL | 2 refills | Status: DC | PRN

## 2020-05-29 MED ORDER — ZOLPIDEM TARTRATE 10 MG PO TABS: 10.0000 mg | ORAL_TABLET | Freq: Every day | ORAL | 2 refills | Status: DC

## 2020-05-29 NOTE — Telephone Encounter (Signed)
Medication refill printed and put in box for patel to sign

## 2020-05-29 NOTE — Telephone Encounter (Signed)
Would you like to refill both medications?

## 2020-05-29 NOTE — Telephone Encounter (Signed)
Needs ambien & Robaxin  Please call into [facility] in 

## 2020-05-29 NOTE — Telephone Encounter (Signed)
Okay to refill both. Thanks!

## 2020-06-01 ENCOUNTER — Telehealth: Payer: Self-pay | Admitting: Physical Medicine and Rehabilitation

## 2020-06-01 NOTE — Telephone Encounter (Signed)
Pt called she had a referral to [facility] but unfortunately the Doctor isn't approved by her insurance.  Pt would like to know if she gets a referral to Dr. Alvester Morin who she has seen before in the past if she can have an apt made.   Pt would also like to know how much the inj would cost.   Pt had standing xrays and MRI that she will call and get the information faxed over to our office.

## 2020-06-01 NOTE — Telephone Encounter (Signed)
She has Neurosurgeon Dawley, I can see he ordered MRI Cspine. She needs to see Dr. Otelia Sergeant for her cervical spine. IF she wants to see me for lumbar we can do OV. She should get notes from [facility] office.

## 2020-06-01 NOTE — Telephone Encounter (Signed)
Pt would like an appt. We take pt insurance [employer].

## 2020-06-04 ENCOUNTER — Telehealth: Payer: Self-pay | Admitting: *Deleted

## 2020-06-04 ENCOUNTER — Other Ambulatory Visit: Payer: Self-pay | Admitting: Internal Medicine

## 2020-06-04 DIAGNOSIS — M542 Cervicalgia: Secondary | ICD-10-CM

## 2020-06-04 DIAGNOSIS — G8929 Other chronic pain: Secondary | ICD-10-CM

## 2020-06-04 DIAGNOSIS — M4800 Spinal stenosis, site unspecified: Secondary | ICD-10-CM

## 2020-06-04 MED ORDER — METHOCARBAMOL 750 MG PO TABS: 750.0000 mg | ORAL_TABLET | Freq: Three times a day (TID) | ORAL | 2 refills | Status: DC | PRN

## 2020-06-04 NOTE — Telephone Encounter (Signed)
Called pt and informed to make an appt with Dr. Otelia Sergeant or call back if she wants appt Dr Alvester Morin for her back.

## 2020-06-04 NOTE — Telephone Encounter (Signed)
Sent 90 with 2 refills. Thanks.

## 2020-06-04 NOTE — Telephone Encounter (Signed)
Pt called about her methocarbamal. She stated that 30 pills were sent in for her refill and this will only last her 10 days as she takes 3 pills a day. Wanted to know if you would send in the right amount. Please advise

## 2020-06-04 NOTE — Telephone Encounter (Signed)
Pt notified of medications with vebal understanding

## 2020-06-15 ENCOUNTER — Other Ambulatory Visit: Payer: Self-pay

## 2020-06-15 ENCOUNTER — Ambulatory Visit
Admission: RE | Admit: 2020-06-15 | Discharge: 2020-06-15 | Disposition: A | Discharge status: Home / Self Care | Payer: 59 | Source: Ambulatory Visit | Attending: Internal Medicine | Admitting: Internal Medicine

## 2020-06-15 DIAGNOSIS — Z1231 Encounter for screening mammogram for malignant neoplasm of breast: Secondary | ICD-10-CM

## 2020-06-26 ENCOUNTER — Other Ambulatory Visit: Payer: Self-pay | Admitting: Family Medicine

## 2020-06-26 DIAGNOSIS — M542 Cervicalgia: Secondary | ICD-10-CM

## 2020-06-26 DIAGNOSIS — Z9889 Other specified postprocedural states: Secondary | ICD-10-CM

## 2020-06-27 ENCOUNTER — Other Ambulatory Visit: Payer: Self-pay | Admitting: *Deleted

## 2020-06-27 DIAGNOSIS — M542 Cervicalgia: Secondary | ICD-10-CM

## 2020-06-27 DIAGNOSIS — Z9889 Other specified postprocedural states: Secondary | ICD-10-CM

## 2020-06-27 MED ORDER — GABAPENTIN 300 MG PO CAPS: ORAL_CAPSULE | ORAL | 2 refills | Status: DC

## 2020-08-01 ENCOUNTER — Ambulatory Visit (INDEPENDENT_AMBULATORY_CARE_PROVIDER_SITE_OTHER): Payer: Medicare Other | Admitting: Internal Medicine

## 2020-08-01 ENCOUNTER — Ambulatory Visit: Payer: 59 | Admitting: Internal Medicine

## 2020-08-01 ENCOUNTER — Encounter: Payer: Self-pay | Admitting: Internal Medicine

## 2020-08-01 ENCOUNTER — Ambulatory Visit (HOSPITAL_COMMUNITY)
Admission: RE | Admit: 2020-08-01 | Discharge: 2020-08-01 | Disposition: A | Discharge status: Home / Self Care | Payer: Medicare Other | Source: Ambulatory Visit | Attending: Internal Medicine | Admitting: Internal Medicine

## 2020-08-01 ENCOUNTER — Other Ambulatory Visit: Payer: Self-pay

## 2020-08-01 VITALS — BP 114/80 | HR 82 | Temp 98.3°F | Resp 18 | Ht 66.0 in | Wt 151.1 lb

## 2020-08-01 DIAGNOSIS — M79641 Pain in right hand: Secondary | ICD-10-CM | POA: Diagnosis present

## 2020-08-01 DIAGNOSIS — M4800 Spinal stenosis, site unspecified: Secondary | ICD-10-CM

## 2020-08-01 DIAGNOSIS — M654 Radial styloid tenosynovitis [de Quervain]: Secondary | ICD-10-CM

## 2020-08-01 DIAGNOSIS — F5101 Primary insomnia: Secondary | ICD-10-CM

## 2020-08-01 DIAGNOSIS — J4520 Mild intermittent asthma, uncomplicated: Secondary | ICD-10-CM | POA: Diagnosis not present

## 2020-08-01 DIAGNOSIS — Z0000 Encounter for general adult medical examination without abnormal findings: Secondary | ICD-10-CM | POA: Diagnosis not present

## 2020-08-01 NOTE — Assessment & Plan Note (Signed)
S/p fusion surgery in 2013 and 2014 Has recurrent neck pain with numbness and weakness of b/l UE Continue Gabapentin and Cymbalta

## 2020-08-01 NOTE — Patient Instructions (Addendum)
De Quervain's Tenosynovitis  De Quervain's tenosynovitis is a condition that causes inflammation of the tendon on the thumb side of the wrist. Tendons are cords of tissue that connect bones to muscles. The tendons in the hand pass through a tunnel called a sheath. A slippery layer of tissue (synovium) lets the tendons move smoothly in the sheath. With de Quervain's tenosynovitis, the sheath swells or thickens, causing friction and pain. The condition is also called de Quervain's disease and de Quervain's syndrome. It occurs most often in women who are 30-50 years old. What are the causes? The exact cause of this condition is not known. It may be associated with overuse of the hand and wrist. What increases the risk? You are more likely to develop this condition if you:  Use your hands far more than normal, especially if you repeat certain movements that involve twisting your hand or using a tight grip.  Are pregnant.  Are a middle-aged woman.  Have rheumatoid arthritis.  Have diabetes. What are the signs or symptoms? The main symptom of this condition is pain on the thumb side of the wrist. The pain may get worse when you grasp something or turn your wrist. Other symptoms may include:  Pain that extends up the forearm.  Swelling of your wrist and hand.  Trouble moving the thumb and wrist.  A sensation of snapping in the wrist.  A bump filled with fluid (cyst) in the area of the pain. How is this diagnosed? This condition may be diagnosed based on:  Your symptoms and medical history.  A physical exam. During the exam, your health care provider may do a simple test (Finkelstein test) that involves pulling your thumb and wrist to see if this causes pain. You may also need to have an X-ray or ultrasound. How is this treated? Treatment for this condition may include:  Avoiding any activity that causes pain and swelling.  Taking medicines. Anti-inflammatory medicines and  corticosteroid injections may be used to reduce inflammation and relieve pain.  Wearing a splint.  Having surgery. This may be needed if other treatments do not work. Once the pain and swelling have gone down, you may start:  Physical therapy. This includes exercises to improve movement and strength in your wrist and thumb.  Occupational therapy. This includes adjusting how you move your wrist. Follow these instructions at home: If you have a splint:  Wear the splint as told by your health care provider. Remove it only as told by your health care provider.  Loosen the splint if your fingers tingle, become numb, or turn cold and blue.  Keep the splint clean.  If the splint is not waterproof: ? Do not let it get wet. ? Cover it with a watertight covering when you take a bath or a shower. Managing pain, stiffness, and swelling  Avoid movements and activities that cause pain and swelling in the wrist area.  If directed, put ice on the painful area. This may be helpful after doing activities that involve the sore wrist. To do this: ? Put ice in a plastic bag. ? Place a towel between your skin and the bag. ? Leave the ice on for 20 minutes, 2-3 times a day. ? Remove the ice if your skin turns bright red. This is very important. If you cannot feel pain, heat, or cold, you have a greater risk of damage to the area.  Move your fingers often to reduce stiffness and swelling.  Raise (elevate) the   injured area above the level of your heart while you are sitting or lying down.   General instructions  Return to your normal activities as told by your health care provider. Ask your health care provider what activities are safe for you.  Take over-the-counter and prescription medicines only as told by your health care provider.  Keep all follow-up visits. This is important. Contact a health care provider if:  Your pain medicine does not help.  Your pain gets worse.  You develop new  symptoms. Summary  De Quervain's tenosynovitis is a condition that causes inflammation of the tendon on the thumb side of the wrist.  The condition occurs most often in women who are 30-50 years old.  The exact cause of this condition is not known. It may be associated with overuse of the hand and wrist.  Treatment starts with avoiding activity that causes pain or swelling in the wrist area. Other treatments may include wearing a splint and taking medicine. Sometimes, surgery is needed. This information is not intended to replace advice given to you by your health care provider. Make sure you discuss any questions you have with your health care provider. Document Revised: 09/28/2019 Document Reviewed: 09/28/2019 Elsevier Patient Education  2021 [employer]  

## 2020-08-01 NOTE — Assessment & Plan Note (Signed)
Well-controlled with Ambien

## 2020-08-01 NOTE — Assessment & Plan Note (Signed)
Left thumb area pain over lateral aspect, unable bend or make a fist Check X-ray of the hand to r/o arthritic changes Advised to use splint Tylenol PRN Heating pad May refer for OT based on X-ray finding

## 2020-08-01 NOTE — Progress Notes (Signed)
Established Patient Office Visit  Subjective:  Patient ID: Stephanie Noble, female    DOB: [birth year]  Age: 65 y.o. MRN: [id number]  CC:  Chief Complaint  Patient presents with  . Follow-up    3 month follow up left thumb pain has been achy for awhile the last two weeks when using it the pain is excrutiating slight hump on right hand and it locks up at times     HPI Stephanie Noble is a 64 year old female with PMH of cervical spinal stenosis s/p fusion surgery, lumbar disc herniation s/p surgery, history of cardiac arrest related to operative analgesia and asthma who presents for follow up of chronic medical conditions and left thumb pain.  She has had left thumb pain for the last 2 weeks, which is on the lateral side. She is not able to make a fist and bend the thumb. She denies any recent injury. Denies any local swelling or redness.  She also has noticed a bump over her right hand, which causes her to have pain upon movement and lifting objects. Pain is intermittent and is not associated with any localized redness. Denies any recent injury. Of note, she has a h/o cervical spinal stenosis and c/o numbness over distal interphalangeal joint of the 3rd digit. She has been taking Gabapentin and Cymbalta.  She had 2 episodes of dyspnea recently and had to use Albuterol nebs. She has been doing well since then with Flovent and Albuterol inhalers.  Past Medical History:  Diagnosis Date  . Anxiety   . Asthma    uses albuterol as needed "mostly weather induced".ALbuterol and Ventolin inhaler as needed  . Chronic back pain   . Chronic neck pain   . Complication of anesthesia    post-op PEA arrest 04/16/16, thought due to respriatory arrest from sedation, narcotic, hypovolemia  . Degenerative disc disease    cervical and lumbar area  . Depression   . History of bronchitis 2016  . History of cervical spinal surgery 12/30/2019  . History of lumbar surgery 12/30/2019  . Insomnia    takes  Ambien nightly  . Muscle spasm    takes Robaxin daily   . Pneumonia    hx of-2016  . PONV (postoperative nausea and vomiting)   . Weakness    numbness and tingling in legs    Past Surgical History:  Procedure Laterality Date  . BACK SURGERY     x 3 lumbar  . CHOLECYSTECTOMY    . DILATION AND CURETTAGE OF UTERUS     x 2  . KNEE SURGERY Left    x 2  . left knee arthroscopy      x 2  . POSTERIOR CERVICAL FUSION/FORAMINOTOMY  05/17/2012   Procedure: POSTERIOR CERVICAL FUSION/FORAMINOTOMY LEVEL 2;  Surgeon: Hewitt Shorts, MD;  Location: [facility];  Service: Neurosurgery;  Laterality: N/A;  cervical six to Thoracic One posterior cervical arthrodesis with instrumentation  . SHOULDER ARTHROSCOPY Left 10/27/2019   Procedure: left shoulder arthroscopy with arthroscopic vs open distal clavical excision;  Surgeon: Cammy Copa, MD;  Location: [facility] OR;  Service: Orthopedics;  Laterality: Left;  . SHOULDER SURGERY     right  . SPINE SURGERY     x 5  . TOE FUSION Left 09/2016  . TOTAL HIP ARTHROPLASTY Left 11/09/2018   Procedure: LEFT TOTAL HIP ARTHROPLASTY DIRECT ANTERIOR APPROACH;  Surgeon: Cammy Copa, MD;  Location: [facility];  Service: Orthopedics;  Laterality: Left;  Family History  Problem Relation Age of Onset  . Dementia Mother   . Other Sister   . Breast cancer Sister   . Alcohol abuse Other   . Breast cancer Paternal Aunt     Social History   Socioeconomic History  . Marital status: Married    Spouse name: Not on file  . Number of children: Not on file  . Years of education: Not on file  . Highest education level: Not on file  Occupational History  . Not on file  Tobacco Use  . Smoking status: Never Smoker  . Smokeless tobacco: Never Used  Vaping Use  . Vaping Use: Never used  Substance and Sexual Activity  . Alcohol use: No  . Drug use: No  . Sexual activity: Yes    Birth control/protection: Post-menopausal  Other Topics Concern  . Not on  file  Social History Narrative  . Not on file   Social Determinants of Health   Financial Resource Strain: Not on file  Food Insecurity: Not on file  Transportation Needs: Not on file  Physical Activity: Not on file  Stress: Not on file  Social Connections: Not on file  Intimate Partner Violence: Not on file    Outpatient Medications Prior to Visit  Medication Sig Dispense Refill  . albuterol (PROVENTIL) (2.5 MG/3ML) 0.083% nebulizer solution Take 3 mLs (2.5 mg total) by nebulization every 4 (four) hours as needed for wheezing or shortness of breath. 150 mL 5  . albuterol (VENTOLIN HFA) 108 (90 Base) MCG/ACT inhaler Inhale 2 sprays QID prn wheezing (Patient taking differently: Inhale 2 puffs into the lungs 4 (four) times daily as needed for wheezing or shortness of breath.) 18 g 0  . DULoxetine (CYMBALTA) 20 MG capsule Take 2 capsules (40 mg total) by mouth daily. 180 capsule 1  . fluticasone (FLOVENT HFA) 44 MCG/ACT inhaler INHALE 2 PUFFS TWICE A DAY 30 Inhaler 5  . gabapentin (NEURONTIN) 300 MG capsule TAKE 2 CAPSULE IN THE MORNING , 2 CAPS AT LUNCH AND 3 CAPS AT BEDTIME 180 capsule 2  . methocarbamol (ROBAXIN) 750 MG tablet Take 1 tablet (750 mg total) by mouth 3 (three) times daily as needed. 90 tablet 2  . zolpidem (AMBIEN) 10 MG tablet Take 1 tablet (10 mg total) by mouth at bedtime. 30 tablet 2   No facility-administered medications prior to visit.    No Active Allergies  ROS Review of Systems  Constitutional: Negative for chills and fever.  HENT: Negative for congestion, sinus pressure, sinus pain and sore throat.   Eyes: Negative for pain and discharge.  Respiratory: Negative for cough and shortness of breath.   Cardiovascular: Negative for chest pain and palpitations.  Gastrointestinal: Negative for abdominal pain, constipation, diarrhea, nausea and vomiting.  Endocrine: Negative for polydipsia and polyuria.  Genitourinary: Negative for dysuria and hematuria.   Musculoskeletal: Positive for back pain and neck pain. Negative for neck stiffness.       Left thumb pain Right hand pain and bump  Skin: Negative for rash.  Neurological: Positive for weakness (B/l UE) and numbness (B/l UE). Negative for dizziness and headaches.  Psychiatric/Behavioral: Negative for agitation and behavioral problems.      Objective:    Physical Exam Vitals reviewed.  Constitutional:      General: She is not in acute distress.    Appearance: She is not diaphoretic.  HENT:     Head: Normocephalic and atraumatic.     Nose: Nose normal.  Mouth/Throat:     Mouth: Mucous membranes are moist.  Eyes:     General: No scleral icterus.    Extraocular Movements: Extraocular movements intact.     Pupils: Pupils are equal, round, and reactive to light.  Neck:     Comments: Well-healed scar in the cervical (neck) area Cardiovascular:     Rate and Rhythm: Normal rate and regular rhythm.     Pulses: Normal pulses.     Heart sounds: Normal heart sounds. No murmur heard. No friction rub. No gallop.   Pulmonary:     Breath sounds: Normal breath sounds. No wheezing or rales.  Musculoskeletal:     Right hand: Bony tenderness (around 2nd metacarpal, dorsal aspect) present. No swelling.     Left hand: Tenderness (Left thumb, lateral area) present.     Cervical back: Neck supple. No tenderness.     Right lower leg: No edema.     Left lower leg: No edema.     Comments: Unable to make fist and bend thumb of left hand  Skin:    General: Skin is warm.     Findings: No rash.  Neurological:     General: No focal deficit present.     Mental Status: She is alert and oriented to person, place, and time.     Sensory: No sensory deficit.     Motor: No weakness.  Psychiatric:        Mood and Affect: Mood normal.        Behavior: Behavior normal.     BP 114/80 (BP Location: Right Arm, Patient Position: Sitting, Cuff Size: Normal)   Pulse 82   Temp 98.3 F (36.8 C) (Oral)    Resp 18   Ht 5\' 6"  (1.676 m)   Wt 151 lb 1.9 oz (68.5 kg)   SpO2 98%   BMI 24.39 kg/m  Wt Readings from Last 3 Encounters:  08/01/20 151 lb 1.9 oz (68.5 kg)  05/01/20 151 lb 1.9 oz (68.5 kg)  12/30/19 153 lb (69.4 kg)     Health Maintenance Due  Topic Date Due  . Hepatitis C Screening  Never done  . HIV Screening  Never done  . TETANUS/TDAP  Never done  . PAP SMEAR-Modifier  Never done    There are no preventive care reminders to display for this patient.  Lab Results  Component Value Date   TSH 4.000 06/26/2017   Lab Results  Component Value Date   WBC 3.6 (L) 10/24/2019   HGB 14.5 10/24/2019   HCT 43.2 10/24/2019   MCV 91.9 10/24/2019   PLT 222 10/24/2019   Lab Results  Component Value Date   NA 142 10/24/2019   K 4.0 10/24/2019   CO2 32 10/24/2019   GLUCOSE 60 (L) 10/24/2019   BUN 9 10/24/2019   CREATININE 0.77 10/24/2019   BILITOT 0.5 04/17/2016   ALKPHOS 39 04/17/2016   AST 87 (H) 04/17/2016   ALT 71 (H) 04/17/2016   PROT 5.6 (L) 04/17/2016   ALBUMIN 3.2 (L) 04/17/2016   CALCIUM 9.6 10/24/2019   ANIONGAP 8 10/24/2019   No results found for: CHOL No results found for: HDL No results found for: LDLCALC No results found for: TRIG No results found for: CHOLHDL No results found for: 10/26/2019    Assessment & Plan:   Problem List Items Addressed This Visit      Respiratory   Asthma    Uses Albuterol inhaler PRN, uses it occasionally Continue Flovent inhaler -  advised to rinse mouth after use for proper oral care        Musculoskeletal and Integument   Tenosynovitis, de Quervain - Primary    Left thumb area pain over lateral aspect, unable bend or make a fist Check X-ray of the hand to r/o arthritic changes Advised to use splint Tylenol PRN Heating pad May refer for OT based on X-ray finding      Relevant Orders   DG Hand Complete Left     Other   Central spinal stenosis    S/p fusion surgery in 2013 and 2014 Has recurrent neck pain  with numbness and weakness of b/l UE Continue Gabapentin and Cymbalta      Right hand pain    Has noticed a bump near second metacarpal over dorsal aspect Appears to be bony prominence Check X-ray of the right hand      Relevant Orders   DG Hand Complete Right    No orders of the defined types were placed in this encounter.   Follow-up: Return in about 3 months (around 10/29/2020) for Annual physical.    Anabel Halon, MD

## 2020-08-01 NOTE — Assessment & Plan Note (Signed)
Has noticed a bump near second metacarpal over dorsal aspect Appears to be bony prominence Check X-ray of the right hand

## 2020-08-01 NOTE — Assessment & Plan Note (Signed)
Uses Albuterol inhaler PRN, uses it occasionally Continue Flovent inhaler - advised to rinse mouth after use for proper oral care

## 2020-08-02 ENCOUNTER — Other Ambulatory Visit: Payer: Self-pay | Admitting: Internal Medicine

## 2020-08-02 DIAGNOSIS — M19042 Primary osteoarthritis, left hand: Secondary | ICD-10-CM

## 2020-08-02 MED ORDER — TRAMADOL HCL 50 MG PO TABS: 50.0000 mg | ORAL_TABLET | Freq: Two times a day (BID) | ORAL | 0 refills | Status: AC | PRN

## 2020-08-02 NOTE — Addendum Note (Signed)
Addended byTrena Platt on: 08/02/2020 03:05 PM   Modules accepted: Orders

## 2020-08-03 NOTE — Progress Notes (Signed)
LVM to let pt know medication had been sent in

## 2020-08-13 ENCOUNTER — Ambulatory Visit (HOSPITAL_COMMUNITY): Payer: Medicare Other | Attending: Internal Medicine | Admitting: Occupational Therapy

## 2020-08-13 ENCOUNTER — Encounter (HOSPITAL_COMMUNITY): Payer: Self-pay | Admitting: Occupational Therapy

## 2020-08-13 ENCOUNTER — Other Ambulatory Visit: Payer: Self-pay

## 2020-08-13 DIAGNOSIS — R29898 Other symptoms and signs involving the musculoskeletal system: Secondary | ICD-10-CM | POA: Insufficient documentation

## 2020-08-13 DIAGNOSIS — M25542 Pain in joints of left hand: Secondary | ICD-10-CM | POA: Insufficient documentation

## 2020-08-13 DIAGNOSIS — M25541 Pain in joints of right hand: Secondary | ICD-10-CM | POA: Diagnosis present

## 2020-08-13 NOTE — Patient Instructions (Signed)
Exercises and instructions Aim to complete the following exercises twice daily. They will  help to reduce joint stiffness and maintain the range of  movement of your thumb. Exercise within a comfortable and  pain free range. If you find the exercises difficult or  uncomfortable check that you are doing them correctly and  reduce the number of repetitions. If you continue to  experience any problems please contact the department. 1. Place hand and forearm palm up on a table.  Using the other hand, stretch the affected  thumb out to the side, away from the palm,  pushing from the base. Hold for 10 seconds and  repeat three times. Do not pull from thumb tip. 2. Place hand palm down on a table. Slide the  thumb out to the side along the table as far  away from the index finger as comfortable and  hold for 10 seconds. Repeat three times.  3. Place hand palm down on the table. Whilst  holding the thumb out to the side slightly lift the  thumb upwards off the table and hold for 10  seconds. Repeat three times. 4. Touch the tip of your thumb to the tip of each  finger alternatively to make an 'O' shape between  the thumb and each finger. Repeat three times. 5. Touch the tip of your thumb to the base of your  little finger and hold for 10 seconds. Repeat three  times. 6. Place hand palm down on table and move your  first (index) finger out to the side towards your  thumb. This strengthens a muscle which is  attached to the base of your thumb and helps to  stabalise your joint. Repeat three times   Joint protection Most people find their own ways of doing activities that are  less painful. It is important that you are aware of the activities  that cause your thumb joint to be painful so that you know  when to wear your splints and consider other ways to perform these activities that place less strain on the painful joints. Each time you experience thumb pain when doing an activity,   stop and consider whether the way you are doing it is causing  stress on the joint. Think about if there is another way the  activity can be performed that is better for your joints.  For example: . When doing activities that involve a pinch grip  (eg writing) keep the top joint of the thumb  bent and the wrist extended.  . When doing activities that involve turning or  twisting avoid fully straightening the top joint  of the thumb and the thumb crossing in front  of the palm.  The following are joint protection techniques  that may help to reduce the pain you experience  when doing activities and prevent further damage to the  joints: . Take notice of any pain you feel, it can serve as a warning  that the way you are performing the activity is causing  damage to the joint.  Marland Kitchen Spread the load over several joints (eg by carrying items on  two flat hands rather than gripping with your thumb). . Use larger stronger joints rather than putting the strain  through your thumb joints.  . Use less effort (eg push or slide heavy items rather than  Carrying).

## 2020-08-13 NOTE — Therapy (Signed)
[facility] [address] [place], [place], [address] Phone: [phone]   Fax:  [phone]  Occupational Therapy Evaluation  Patient Details  Name: Stephanie Noble MRN: [id number] Date of Birth: 29-Nov-1955 Referring Provider (OT): Dr. Trena Platt   Encounter Date: 08/13/2020   OT End of Session - 08/13/20 1035    Visit Number 1    Number of Visits 1    Date for OT Re-Evaluation 08/14/20    Authorization Type Medicare A & B    Authorization Time Period No visit limit    Progress Note Due on Visit 10    OT Start Time 0932    OT Stop Time 1015    OT Time Calculation (min) 43 min    Activity Tolerance Patient tolerated treatment well    Behavior During Therapy [facility] for tasks assessed/performed           Past Medical History:  Diagnosis Date  . Anxiety   . Asthma    uses albuterol as needed "mostly weather induced".ALbuterol and Ventolin inhaler as needed  . Chronic back pain   . Chronic neck pain   . Complication of anesthesia    post-op PEA arrest 04/16/16, thought due to respriatory arrest from sedation, narcotic, hypovolemia  . Degenerative disc disease    cervical and lumbar area  . Depression   . History of bronchitis 2016  . History of cervical spinal surgery 12/30/2019  . History of lumbar surgery 12/30/2019  . Insomnia    takes Ambien nightly  . Muscle spasm    takes Robaxin daily   . Pneumonia    hx of-2016  . PONV (postoperative nausea and vomiting)   . Weakness    numbness and tingling in legs    Past Surgical History:  Procedure Laterality Date  . BACK SURGERY     x 3 lumbar  . CHOLECYSTECTOMY    . DILATION AND CURETTAGE OF UTERUS     x 2  . KNEE SURGERY Left    x 2  . left knee arthroscopy      x 2  . POSTERIOR CERVICAL FUSION/FORAMINOTOMY  05/17/2012   Procedure: POSTERIOR CERVICAL FUSION/FORAMINOTOMY LEVEL 2;  Surgeon: Hewitt Shorts, MD;  Location: [facility];  Service: Neurosurgery;   Laterality: N/A;  cervical six to Thoracic One posterior cervical arthrodesis with instrumentation  . SHOULDER ARTHROSCOPY Left 10/27/2019   Procedure: left shoulder arthroscopy with arthroscopic vs open distal clavical excision;  Surgeon: Cammy Copa, MD;  Location: [facility] OR;  Service: Orthopedics;  Laterality: Left;  . SHOULDER SURGERY     right  . SPINE SURGERY     x 5  . TOE FUSION Left 09/2016  . TOTAL HIP ARTHROPLASTY Left 11/09/2018   Procedure: LEFT TOTAL HIP ARTHROPLASTY DIRECT ANTERIOR APPROACH;  Surgeon: Cammy Copa, MD;  Location: [facility];  Service: Orthopedics;  Laterality: Left;    There were no vitals filed for this visit.   Subjective Assessment - 08/13/20 1033    Subjective  S: I've had 16 surgeries so I don't usually complain about pain until it gets really bad.    Pertinent History Pt is a 65 y/o female presenting with bilateral hand pain. Recent x-rays show degenerative changes, pt reports MD told her she has severe arthritis. Pt was referred to occupational therapy for evaluation and treatment by Dr. Trena Platt.    Special Tests DASH: 43.18    Patient Stated Goals To  have less pain in my hands.    Currently in Pain? Yes    Pain Score 4     Pain Location Hand    Pain Orientation Left;Right    Pain Descriptors / Indicators Aching    Pain Type Chronic pain    Pain Radiating Towards N/A    Pain Onset More than a month ago    Pain Frequency Constant    Aggravating Factors  heavy use    Pain Relieving Factors rest    Effect of Pain on Daily Activities min effect on ADLs, pt pushes through             [facility] - 08/13/20 0928      Assessment   Medical Diagnosis bilateral hand arthritis    Referring Provider (OT) Dr. Trena Plattutwik Patel    Onset Date/Surgical Date --   left-years; right-last 3 months   Hand Dominance Right    Next MD Visit 10/31/20    Prior Therapy None      Precautions   Precautions None      Restrictions   Weight Bearing  Restrictions No      Balance Screen   Has the patient fallen in the past 6 months Yes    How many times? 1    Has the patient had a decrease in activity level because of a fear of falling?  No    Is the patient reluctant to leave their home because of a fear of falling?  No      Prior Function   Level of Independence Independent    Vocation Retired    Leisure playing with grandkids, lots of cooking, lot of gardening      ADL   ADL comments Pt is having difficulty with lifting 65 year old grandchild, gardening tasks, cooking tasks, hooking/unhooking bra, buttoning, picking up pills from countertop, opening and closing bottles/jars      Written Expression   Dominant Hand Right      Cognition   Overall Cognitive Status Within Functional Limits for tasks assessed      Observation/Other Assessments   Quick DASH  43.18      ROM / Strength   AROM / PROM / Strength Strength      Strength   Strength Assessment Site Hand    Right/Left hand Right;Left    Right Hand Gross Grasp Functional    Right Hand Grip (lbs) 55    Right Hand Lateral Pinch 8 lbs    Right Hand 3 Point Pinch 6 lbs    Left Hand Gross Grasp Functional    Left Hand Grip (lbs) 57    Left Hand Lateral Pinch 7 lbs    Left Hand 3 Point Pinch 6 lbs               Neldon McQuick Dash - 08/13/20 0940    Open a tight or new jar Unable    Do heavy household chores (wash walls, wash floors) Moderate difficulty    Carry a shopping bag or briefcase Moderate difficulty    Wash your back Moderate difficulty    Use a knife to cut food Mild difficulty    Recreational activities in which you take some force or impact through your arm, shoulder, or hand (golf, hammering, tennis) Mild difficulty    During the past week, to what extent has your arm, shoulder or hand problem interfered with your normal social activities with family, friends, neighbors, or groups? Not at  all    During the past week, to what extent has your arm, shoulder or  hand problem limited your work or other regular daily activities Modererately    Arm, shoulder, or hand pain. Severe    Tingling (pins and needles) in your arm, shoulder, or hand Moderate    Difficulty Sleeping No difficulty    DASH Score 43.18 %                      OT Education - 08/13/20 1032    Education Details hand/thumb exercises; reviewed joint protection, educated on assistive devices and ergonomic tools for joint protection    Person(s) Educated Patient    Methods Explanation;Demonstration;Handout    Comprehension Verbalized understanding;Returned demonstration            OT Short Term Goals - 08/13/20 1042      OT SHORT TERM GOAL #1   Title Pt will be provided with and educated on HEP to improve hand use during ADLs.    Time 1    Period Days    Status Achieved    Target Date 08/13/20                    Plan - 08/13/20 1036    Clinical Impression Statement A: Pt is a 65 y/o female presenting with bilateral hand pain secondary to arthritis and degenerative changes. Pt reports heavy daily use of her hands including gardening, cooking, playing with grandkids, and doing household chores. Extensive discussion with pt regarding progression of age related degenerative changes and importance of joint protection strategies. Problem-solved with pt regarding ergonomic options for tasks and finding the fine line of enough mobility/use and too much repetitive use. Pt demonstrates good grip strength, pinch strength is decreased in bilateral hands. Pt verbalizes understanding and would like HEP as she has been through a lot of therapy, as well as uses her hands for strengthening tasks on a daily basis. Provided pencil gripper for improved comfort and joint protection during writing tasks.    OT Occupational Profile and History Problem Focused Assessment - Including review of records relating to presenting problem    Occupational performance deficits (Please refer to  evaluation for details): ADL's;IADL's;Leisure    Body Structure / Function / Physical Skills ADL;UE functional use;Pain;Strength;IADL    Rehab Potential Good    Clinical Decision Making Limited treatment options, no task modification necessary    Comorbidities Affecting Occupational Performance: None    Modification or Assistance to Complete Evaluation  No modification of tasks or assist necessary to complete eval    OT Frequency One time visit    OT Treatment/Interventions Patient/family education    Plan P: Pt educated on joint protection, ergonomic tools available, joint protection splint for daily tasks, HEP on thumb and finger mobility. Pt verbalizes understanding.    OT Home Exercise Plan eval: HEP, ergonomics, joint protection    Consulted and Agree with Plan of Care Patient           Patient will benefit from skilled therapeutic intervention in order to improve the following deficits and impairments:   Body Structure / Function / Physical Skills: ADL,UE functional use,Pain,Strength,IADL       Visit Diagnosis: Pain in joint of left hand  Pain in joint of right hand  Other symptoms and signs involving the musculoskeletal system    Problem List Patient Active Problem List   Diagnosis Date Noted  . Tenosynovitis, de Quervain 08/01/2020  .  Right hand pain 08/01/2020  . Encounter to establish care 05/01/2020  . Central spinal stenosis 05/01/2020  . H/O cardiac arrest 05/01/2020  . Special screening for malignant neoplasms, colon 05/01/2020  . Screening mammogram for breast cancer 05/01/2020  . Asthma 05/01/2020  . 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  . 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 PTSD (post-traumatic stress disorder) 04/13/2017  . Hallux rigidus, left foot 09/15/2016  . Hallux rigidus, right foot 09/15/2016  . HNP (herniated nucleus pulposus), lumbar 04/16/2016  . Chronic neck pain 06/22/2015  . Arthritis 01/04/2013  . Insomnia 01/04/2013   Ezra Sites, OTR/L   [phone] 08/13/2020, 10:42 AM  [facility] [address] [place], [place], [address] Phone: [phone]   Fax:  [phone]  Name: Stephanie Noble MRN: [id number] Date of Birth: Stephanie 09, 1957

## 2020-08-24 ENCOUNTER — Other Ambulatory Visit: Payer: Self-pay | Admitting: Internal Medicine

## 2020-08-24 DIAGNOSIS — F5101 Primary insomnia: Secondary | ICD-10-CM

## 2020-09-10 ENCOUNTER — Other Ambulatory Visit: Payer: Self-pay | Admitting: Internal Medicine

## 2020-09-10 DIAGNOSIS — M542 Cervicalgia: Secondary | ICD-10-CM

## 2020-09-10 DIAGNOSIS — Z9889 Other specified postprocedural states: Secondary | ICD-10-CM

## 2020-09-10 DIAGNOSIS — G8929 Other chronic pain: Secondary | ICD-10-CM

## 2020-09-21 ENCOUNTER — Other Ambulatory Visit: Payer: Self-pay | Admitting: Internal Medicine

## 2020-09-21 DIAGNOSIS — G8929 Other chronic pain: Secondary | ICD-10-CM

## 2020-09-21 DIAGNOSIS — M4800 Spinal stenosis, site unspecified: Secondary | ICD-10-CM

## 2020-10-31 ENCOUNTER — Ambulatory Visit: Payer: Medicare Other | Admitting: Internal Medicine

## 2020-11-19 ENCOUNTER — Other Ambulatory Visit: Payer: Self-pay | Admitting: Internal Medicine

## 2020-11-19 DIAGNOSIS — F5101 Primary insomnia: Secondary | ICD-10-CM

## 2020-11-19 DIAGNOSIS — M542 Cervicalgia: Secondary | ICD-10-CM

## 2020-11-19 DIAGNOSIS — Z9889 Other specified postprocedural states: Secondary | ICD-10-CM

## 2020-11-19 DIAGNOSIS — G8929 Other chronic pain: Secondary | ICD-10-CM

## 2020-12-18 ENCOUNTER — Other Ambulatory Visit: Payer: Self-pay | Admitting: Internal Medicine

## 2020-12-18 DIAGNOSIS — M542 Cervicalgia: Secondary | ICD-10-CM

## 2020-12-18 DIAGNOSIS — G8929 Other chronic pain: Secondary | ICD-10-CM

## 2020-12-18 DIAGNOSIS — M4800 Spinal stenosis, site unspecified: Secondary | ICD-10-CM

## 2020-12-25 ENCOUNTER — Ambulatory Visit: Payer: Medicare Other | Admitting: Internal Medicine

## 2021-02-20 ENCOUNTER — Other Ambulatory Visit: Payer: Self-pay | Admitting: Internal Medicine

## 2021-02-20 DIAGNOSIS — F5101 Primary insomnia: Secondary | ICD-10-CM

## 2021-03-20 ENCOUNTER — Other Ambulatory Visit: Payer: Self-pay | Admitting: Internal Medicine

## 2021-03-20 DIAGNOSIS — G8929 Other chronic pain: Secondary | ICD-10-CM

## 2021-03-20 DIAGNOSIS — M4800 Spinal stenosis, site unspecified: Secondary | ICD-10-CM

## 2021-04-11 ENCOUNTER — Ambulatory Visit (INDEPENDENT_AMBULATORY_CARE_PROVIDER_SITE_OTHER): Payer: Medicare Other | Admitting: Orthopedic Surgery

## 2021-04-11 ENCOUNTER — Other Ambulatory Visit: Payer: Self-pay

## 2021-04-11 DIAGNOSIS — M1712 Unilateral primary osteoarthritis, left knee: Secondary | ICD-10-CM

## 2021-04-11 DIAGNOSIS — M4722 Other spondylosis with radiculopathy, cervical region: Secondary | ICD-10-CM | POA: Diagnosis not present

## 2021-04-12 ENCOUNTER — Encounter: Payer: Self-pay | Admitting: Orthopedic Surgery

## 2021-04-12 ENCOUNTER — Other Ambulatory Visit: Payer: Self-pay

## 2021-04-12 DIAGNOSIS — M4722 Other spondylosis with radiculopathy, cervical region: Secondary | ICD-10-CM

## 2021-04-12 MED ORDER — METHYLPREDNISOLONE ACETATE 40 MG/ML IJ SUSP
40.0000 mg | INTRAMUSCULAR | Status: AC | PRN
  Administered 2021-04-11: 40 mg via INTRA_ARTICULAR

## 2021-04-12 MED ORDER — LIDOCAINE HCL 1 % IJ SOLN
5.0000 mL | INTRAMUSCULAR | Status: AC | PRN
  Administered 2021-04-11: 5 mL

## 2021-04-12 MED ORDER — BUPIVACAINE HCL 0.25 % IJ SOLN
4.0000 mL | INTRAMUSCULAR | Status: AC | PRN
  Administered 2021-04-11: 4 mL via INTRA_ARTICULAR

## 2021-04-12 NOTE — Progress Notes (Signed)
Office Visit Note   Patient: Stephanie Noble           Date of Birth: [birth year]           MRN: [id number] Visit Date: 04/11/2021 Requested by: Stephanie Halon, MD [address] [place],  [place] [address] PCP: Stephanie Halon, MD  Subjective: Chief Complaint  Patient presents with   Neck - Pain    HPI: Stephanie Noble is a 65 year old patient with cervical spine issues and left knee pain.  She has had prior arthroscopy and injections in the left knee.  She would like to have another injection in the left knee today.  Patient also is here for second opinion regarding her neck.  I did explain to her that I do not do spine surgery but reviewed her studies nonetheless.  She has MRI scan from 2021 which is reviewed.  Shows C4 7 fusion with the lower 2 cervical levels definitively fused.  Patient reports neck pain and right radicular pain and left radicular pain on a fairly frequent basis.  She also states her neck locks at times and it requires heating pad and 10 to 15 minutes to "unlock it".  She does report diminished cervical spine range of motion.  She has had an injection which does not help.  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  She has been told that extending the cervical fusion more proximal could result in any swallowing difficulties.  She currently does have some swallowing difficulty.              ROS: All systems reviewed are negative as they relate to the chief complaint within the history of present illness.  Patient denies  fevers or chills.   Assessment & Plan: Visit Diagnoses:  1. 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  She has had both anterior and posterior approaches for her neck.  The CT 3 4 level is definitely symptomatic but whether or not it symptomatic  enough to undergo yet another surgery is hard to determine.  I think Stephanie Noble is very concerned about additional difficulty swallowing with further surgery.  She does not have any discrete objective weakness today but does report some subjective weakness in her arms.  She has had multiple lumbar spine surgeries as well so lower extremity weakness is difficult to evaluate but it does not look like she has myelopathy at this time.  She is otherwise healthy appearing and vibrant other than her multiple orthopedic issues.  I think if she is not quite ready for surgery at this time she could conceivably wait until her symptoms worsen before pursuing additional cervical spine fixation.  I would recommend that she get a better second opinion from either Dr. Levora Noble or Dr. Kriste Noble at [facility].  Left knee injected today per patient request.  Follow-Up Instructions: Return if symptoms worsen or fail to improve.   Orders:  No orders of the defined types were placed in this encounter.  No orders of the defined types were placed in this encounter.     Procedures: Large Joint Inj: L knee on 04/11/2021 7:43 AM Indications: diagnostic evaluation, joint swelling and pain Details: 18 G 1.5 in needle, superolateral approach  Arthrogram: No  Medications: 5 mL lidocaine 1 %; 40  mg methylPREDNISolone acetate 40 MG/ML; 4 mL bupivacaine 0.25 % Outcome: tolerated well, no immediate complications Procedure, treatment alternatives, risks and benefits explained, specific risks discussed. Consent was given by the patient. Immediately prior to procedure a time out was called to verify the correct patient, procedure, equipment, support staff and site/side marked as required. Patient was prepped and draped in the usual sterile fashion.      Clinical Data: No additional findings.  Objective: Vital Signs: There were no vitals taken for this visit.  Physical Exam:   Constitutional: Patient appears  well-developed HEENT:  Head: Normocephalic Eyes:EOM are normal Neck: Normal range of motion Cardiovascular: Normal rate Pulmonary/chest: Effort normal Neurologic: Patient is alert Skin: Skin is warm Psychiatric: Patient has normal mood and affect   Ortho Exam: Ortho exam demonstrates full active and passive range of motion of both knees.  No effusion in the left knee.  Collateral crucial ligaments are stable.  Extensor mechanism is intact.  She has 5 out of 5 grip EPL FPL interosseous wrist flexion extension bicep triceps and deltoid strength with no clonus bilateral lower extremities.  Reflexes symmetric 0 1+ out of 4 bilateral patella Achilles biceps and triceps.  Neck range of motion is limited particularly with extension.  Flexion is about 25 degrees.  Rotation to the right is 30 degrees rotation to the left is only about 5 to 10 degrees.  Radial pulse intact bilaterally.  No muscle atrophy arms or legs.  Specialty Comments:  No specialty comments available.  Imaging: No results found.   PMFS History: Patient Active Problem List   Diagnosis Date Noted   Tenosynovitis, de Quervain 08/01/2020   Right hand pain 08/01/2020   Encounter to establish care 05/01/2020   Central spinal stenosis 05/01/2020   H/O cardiac arrest 05/01/2020   Special screening for malignant neoplasms, colon 05/01/2020   Screening mammogram for breast cancer 05/01/2020   Asthma 05/01/2020   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   Hip arthritis 11/09/2018   PTSD (post-traumatic stress disorder) 04/13/2017   Hallux rigidus, left foot 09/15/2016   Hallux rigidus, right foot 09/15/2016   HNP (herniated nucleus pulposus), lumbar 04/16/2016   Chronic neck pain 06/22/2015   Arthritis 01/04/2013   Insomnia 01/04/2013   Past Medical History:  Diagnosis Date   Anxiety    Asthma    uses albuterol as needed "mostly weather induced".ALbuterol and Ventolin inhaler as needed   Chronic back pain    Chronic neck pain     Complication of anesthesia    post-op PEA arrest 04/16/16, thought due to respriatory arrest from sedation, narcotic, hypovolemia   Degenerative disc disease    cervical and lumbar area   Depression    History of bronchitis 2016   History of cervical spinal surgery 12/30/2019   History of lumbar surgery 12/30/2019   Insomnia    takes Ambien nightly   Muscle spasm    takes Robaxin daily    Pneumonia    hx of-2016   PONV (postoperative nausea and vomiting)    Weakness    numbness and tingling in legs    Family History  Problem Relation Age of Onset   Dementia Mother    Other Sister    Breast cancer Sister    Alcohol abuse Other    Breast cancer Paternal Aunt     Past Surgical History:  Procedure Laterality Date   BACK SURGERY     x 3 lumbar   CHOLECYSTECTOMY     DILATION  AND CURETTAGE OF UTERUS     x 2   KNEE SURGERY Left    x 2   left knee arthroscopy      x 2   POSTERIOR CERVICAL FUSION/FORAMINOTOMY  05/17/2012   Procedure: POSTERIOR CERVICAL FUSION/FORAMINOTOMY LEVEL 2;  Surgeon: Hewitt Shorts, MD;  Location: [facility];  Service: Neurosurgery;  Laterality: N/A;  cervical six to Thoracic One posterior cervical arthrodesis with instrumentation   SHOULDER ARTHROSCOPY Left 10/27/2019   Procedure: left shoulder arthroscopy with arthroscopic vs open distal clavical excision;  Surgeon: Cammy Copa, MD;  Location: [facility] OR;  Service: Orthopedics;  Laterality: Left;   SHOULDER SURGERY     right   SPINE SURGERY     x 5   TOE FUSION Left 09/2016   TOTAL HIP ARTHROPLASTY Left 11/09/2018   Procedure: LEFT TOTAL HIP ARTHROPLASTY DIRECT ANTERIOR APPROACH;  Surgeon: Cammy Copa, MD;  Location: [facility];  Service: Orthopedics;  Laterality: Left;   Social History   Occupational History   Not on file  Tobacco Use   Smoking status: Never   Smokeless tobacco: Never  Vaping Use   Vaping Use: Never used  Substance and Sexual Activity   Alcohol use: No   Drug use:  No   Sexual activity: Yes    Birth control/protection: Post-menopausal

## 2021-04-12 NOTE — Progress Notes (Signed)
LMOM for patient that we are sending referral for second opinion

## 2021-04-19 ENCOUNTER — Other Ambulatory Visit: Payer: Self-pay | Admitting: Internal Medicine

## 2021-04-19 DIAGNOSIS — Z9889 Other specified postprocedural states: Secondary | ICD-10-CM

## 2021-04-19 DIAGNOSIS — G8929 Other chronic pain: Secondary | ICD-10-CM

## 2021-04-23 ENCOUNTER — Telehealth: Payer: Self-pay

## 2021-04-23 NOTE — Telephone Encounter (Signed)
Patient called needs a referral to Dr Drusilla Kanner, Rheumatologist in [place].  Patient said she has seen Dr Allena Katz already for this problem and he is aware of the issue for bilateral hand and wrist pain.  Patient call back # [phone].

## 2021-04-23 NOTE — Telephone Encounter (Signed)
Pt advised that deveshwar would deny this she wanted to know if this is something that you would be able to treat and could she make an appt if not do you have somewhere else that would treat it as she is getting really tired of hurting in both wrist please advise

## 2021-04-24 ENCOUNTER — Other Ambulatory Visit: Payer: Self-pay | Admitting: *Deleted

## 2021-04-24 DIAGNOSIS — M79641 Pain in right hand: Secondary | ICD-10-CM

## 2021-04-24 NOTE — Telephone Encounter (Signed)
Pt would like to be referred to dr dean in [place] as she has seen him for numerouse things referral placed

## 2021-05-01 LAB — COLOGUARD

## 2021-05-13 ENCOUNTER — Encounter: Payer: Self-pay | Admitting: Orthopedic Surgery

## 2021-05-13 ENCOUNTER — Ambulatory Visit (INDEPENDENT_AMBULATORY_CARE_PROVIDER_SITE_OTHER): Payer: Medicare Other | Admitting: Orthopedic Surgery

## 2021-05-13 ENCOUNTER — Other Ambulatory Visit: Payer: Self-pay

## 2021-05-13 DIAGNOSIS — M180 Bilateral primary osteoarthritis of first carpometacarpal joints: Secondary | ICD-10-CM

## 2021-05-13 MED ORDER — MELOXICAM 7.5 MG PO TABS: ORAL_TABLET | ORAL | 0 refills | Status: DC

## 2021-05-15 ENCOUNTER — Encounter: Payer: Self-pay | Admitting: Orthopedic Surgery

## 2021-05-15 NOTE — Progress Notes (Signed)
Office Visit Note   Patient: Stephanie Noble           Date of Birth: [birth year]           MRN: [id number] Visit Date: 05/13/2021 Requested by: Anabel Halon, MD [address] [place],  [place] [address] PCP: Anabel Halon, MD  Subjective: Chief Complaint  Patient presents with   Right Hand - Pain   Left Hand - Pain    HPI: Cythia is a 65 year old patient with bilateral hand pain right worse than left.  Pain is been going on for a year or 2.  Previous radiographs demonstrate some CMC arthritis.  States the pain is getting worse.  She is right-hand dominant.  Describes swelling as well.  Tried Aleve without relief.  Also is having some pain in the DIP joints on the right-hand side.  Hard for her to do).  She used to do a lot of sewing.  Currently taking gabapentin Robaxin Ambien at night as well as Cymbalta.              ROS: All systems reviewed are negative as they relate to the chief complaint within the history of present illness.  Patient denies  fevers or chills.   Assessment & Plan: Visit Diagnoses:  1. Arthritis of carpometacarpal (CMC) joint of both thumbs     Plan: Impression is bilateral hand CMC arthritis.  Grind test positive bilaterally.  Radiographically the arthritis is moderate.  Plan is Mobic 7.5 mg once a day as needed for pain.  She will also try some topical anti-inflammatory.  I think we could also consider an injection for temporary pain relief if those interventions do not work.  Follow-up in 4 to 6 weeks if the topical and oral anti-inflammatory are not giving enough relief.  We also talked about surgical intervention for this but I think she is a little ways away from that.  Follow-Up Instructions: No follow-ups on file.   Orders:  No orders of the defined types were placed in this encounter.  Meds ordered this encounter  Medications   meloxicam (MOBIC) 7.5 MG tablet    Sig: 1 po q d prn    Dispense:  30 tablet    Refill:  0       Procedures: No procedures performed   Clinical Data: No additional findings.  Objective: Vital Signs: There were no vitals taken for this visit.  Physical Exam:   Constitutional: Patient appears well-developed HEENT:  Head: Normocephalic Eyes:EOM are normal Neck: Normal range of motion Cardiovascular: Normal rate Pulmonary/chest: Effort normal Neurologic: Patient is alert Skin: Skin is warm Psychiatric: Patient has normal mood and affect   Ortho Exam: Ortho exam demonstrates intact EPL FPL interosseous function.  She has pretty full composite range of motion in terms of making a fist on both sides.  Not too many Bouchard's or Heberden's nodes around the phalanges on either side.  Positive grind test bilaterally for CMC arthritis.  Negative Finkelstein's.  No snuffbox tenderness.  Specialty Comments:  No specialty comments available.  Imaging: No results found.   PMFS History: Patient Active Problem List   Diagnosis Date Noted   Tenosynovitis, de Quervain 08/01/2020   Right hand pain 08/01/2020   Encounter to establish care 05/01/2020   Central spinal stenosis 05/01/2020   H/O cardiac arrest 05/01/2020   Special screening for malignant neoplasms, colon 05/01/2020   Screening mammogram for breast cancer 05/01/2020   Asthma 05/01/2020  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   Hip arthritis 11/09/2018   PTSD (post-traumatic stress disorder) 04/13/2017   Hallux rigidus, left foot 09/15/2016   Hallux rigidus, right foot 09/15/2016   HNP (herniated nucleus pulposus), lumbar 04/16/2016   Chronic neck pain 06/22/2015   Arthritis 01/04/2013   Insomnia 01/04/2013   Past Medical History:  Diagnosis Date   Anxiety    Asthma    uses albuterol as needed "mostly weather induced".ALbuterol and Ventolin inhaler as needed   Chronic back pain    Chronic neck pain    Complication of anesthesia    post-op PEA arrest 04/16/16, thought due to respriatory arrest from sedation,  narcotic, hypovolemia   Degenerative disc disease    cervical and lumbar area   Depression    History of bronchitis 2016   History of cervical spinal surgery 12/30/2019   History of lumbar surgery 12/30/2019   Insomnia    takes Ambien nightly   Muscle spasm    takes Robaxin daily    Pneumonia    hx of-2016   PONV (postoperative nausea and vomiting)    Weakness    numbness and tingling in legs    Family History  Problem Relation Age of Onset   Dementia Mother    Other Sister    Breast cancer Sister    Alcohol abuse Other    Breast cancer Paternal Aunt     Past Surgical History:  Procedure Laterality Date   BACK SURGERY     x 3 lumbar   CHOLECYSTECTOMY     DILATION AND CURETTAGE OF UTERUS     x 2   KNEE SURGERY Left    x 2   left knee arthroscopy      x 2   POSTERIOR CERVICAL FUSION/FORAMINOTOMY  05/17/2012   Procedure: POSTERIOR CERVICAL FUSION/FORAMINOTOMY LEVEL 2;  Surgeon: Hewitt Shorts, MD;  Location: [facility];  Service: Neurosurgery;  Laterality: N/A;  cervical six to Thoracic One posterior cervical arthrodesis with instrumentation   SHOULDER ARTHROSCOPY Left 10/27/2019   Procedure: left shoulder arthroscopy with arthroscopic vs open distal clavical excision;  Surgeon: Cammy Copa, MD;  Location: [facility] OR;  Service: Orthopedics;  Laterality: Left;   SHOULDER SURGERY     right   SPINE SURGERY     x 5   TOE FUSION Left 09/2016   TOTAL HIP ARTHROPLASTY Left 11/09/2018   Procedure: LEFT TOTAL HIP ARTHROPLASTY DIRECT ANTERIOR APPROACH;  Surgeon: Cammy Copa, MD;  Location: [facility];  Service: Orthopedics;  Laterality: Left;   Social History   Occupational History   Not on file  Tobacco Use   Smoking status: Never   Smokeless tobacco: Never  Vaping Use   Vaping Use: Never used  Substance and Sexual Activity   Alcohol use: No   Drug use: No   Sexual activity: Yes    Birth control/protection: Post-menopausal

## 2021-05-21 ENCOUNTER — Other Ambulatory Visit: Payer: Self-pay | Admitting: Internal Medicine

## 2021-05-21 DIAGNOSIS — F5101 Primary insomnia: Secondary | ICD-10-CM

## 2021-06-10 ENCOUNTER — Other Ambulatory Visit: Payer: Self-pay | Admitting: Orthopedic Surgery

## 2021-06-10 DIAGNOSIS — M180 Bilateral primary osteoarthritis of first carpometacarpal joints: Secondary | ICD-10-CM

## 2021-06-18 ENCOUNTER — Other Ambulatory Visit: Payer: Self-pay | Admitting: Surgical

## 2021-06-18 DIAGNOSIS — M180 Bilateral primary osteoarthritis of first carpometacarpal joints: Secondary | ICD-10-CM

## 2021-06-19 NOTE — Telephone Encounter (Signed)
I will refill  once more but she needs to only take this once or twice a day.  She just had refill.  If she is taking this more than twice per day, then that is not ideal and she needs to consider injection instead of taking more than recommended Mobic dose

## 2021-06-25 ENCOUNTER — Telehealth: Payer: Self-pay | Admitting: Internal Medicine

## 2021-06-25 ENCOUNTER — Other Ambulatory Visit: Payer: Self-pay | Admitting: Internal Medicine

## 2021-06-25 DIAGNOSIS — F5101 Primary insomnia: Secondary | ICD-10-CM

## 2021-06-25 NOTE — Telephone Encounter (Signed)
Pt needs refill on  zolpidem (AMBIEN) 10 MG tablet  [facility] [address]

## 2021-06-25 NOTE — Telephone Encounter (Signed)
Pt made a follow up for 07-10-20 let her know refill at pharmacy to contact them with verbal understanding

## 2021-06-28 ENCOUNTER — Telehealth: Payer: Self-pay | Admitting: Internal Medicine

## 2021-06-28 NOTE — Telephone Encounter (Signed)
Return call

## 2021-06-28 NOTE — Telephone Encounter (Signed)
Called pt this is in reference to husband

## 2021-07-10 ENCOUNTER — Telehealth: Payer: Medicare Other | Admitting: Internal Medicine

## 2021-07-10 ENCOUNTER — Other Ambulatory Visit: Payer: Self-pay

## 2021-07-10 ENCOUNTER — Other Ambulatory Visit: Payer: Self-pay | Admitting: *Deleted

## 2021-07-10 MED ORDER — DULOXETINE HCL 20 MG PO CPEP: 40.0000 mg | ORAL_CAPSULE | Freq: Every day | ORAL | 1 refills | Status: DC

## 2021-07-22 ENCOUNTER — Other Ambulatory Visit: Payer: Self-pay | Admitting: Internal Medicine

## 2021-07-22 DIAGNOSIS — F5101 Primary insomnia: Secondary | ICD-10-CM

## 2021-07-24 ENCOUNTER — Ambulatory Visit (INDEPENDENT_AMBULATORY_CARE_PROVIDER_SITE_OTHER): Payer: Medicare Other

## 2021-07-24 ENCOUNTER — Other Ambulatory Visit: Payer: Self-pay

## 2021-07-24 ENCOUNTER — Telehealth: Payer: Self-pay | Admitting: Internal Medicine

## 2021-07-24 ENCOUNTER — Other Ambulatory Visit: Payer: Self-pay | Admitting: Internal Medicine

## 2021-07-24 ENCOUNTER — Telehealth: Payer: Self-pay

## 2021-07-24 ENCOUNTER — Encounter: Payer: Self-pay | Admitting: Orthopedic Surgery

## 2021-07-24 ENCOUNTER — Ambulatory Visit (INDEPENDENT_AMBULATORY_CARE_PROVIDER_SITE_OTHER): Payer: Medicare Other | Admitting: Surgical

## 2021-07-24 DIAGNOSIS — G8929 Other chronic pain: Secondary | ICD-10-CM

## 2021-07-24 DIAGNOSIS — F5101 Primary insomnia: Secondary | ICD-10-CM

## 2021-07-24 DIAGNOSIS — M25562 Pain in left knee: Secondary | ICD-10-CM

## 2021-07-24 DIAGNOSIS — M1712 Unilateral primary osteoarthritis, left knee: Secondary | ICD-10-CM

## 2021-07-24 MED ORDER — ZOLPIDEM TARTRATE 10 MG PO TABS: ORAL_TABLET | ORAL | 1 refills | Status: DC

## 2021-07-24 MED ORDER — LIDOCAINE HCL 1 % IJ SOLN
5.0000 mL | INTRAMUSCULAR | Status: AC | PRN
  Administered 2021-07-24: 11:00:00 5 mL

## 2021-07-24 MED ORDER — BUPIVACAINE HCL 0.25 % IJ SOLN
4.0000 mL | INTRAMUSCULAR | Status: AC | PRN
  Administered 2021-07-24: 11:00:00 4 mL via INTRA_ARTICULAR

## 2021-07-24 MED ORDER — METHYLPREDNISOLONE ACETATE 40 MG/ML IJ SUSP
40.0000 mg | INTRAMUSCULAR | Status: AC | PRN
  Administered 2021-07-24: 11:00:00 40 mg via INTRA_ARTICULAR

## 2021-07-24 NOTE — Progress Notes (Signed)
Office Visit Note   Patient: Stephanie Noble           Date of Birth: 01/15/1956           MRN: [id number] Visit Date: 07/24/2021 Requested by: Anabel HalonPatel, Rutwik K, MD [address] [place],  [place] [id number] PCP: Anabel HalonPatel, Rutwik K, MD  Subjective: Chief Complaint  Patient presents with   Left Knee - Pain    HPI: Stephanie Noble is a 66 y.o. female who presents to the office complaining of left knee pain.  Patient has history of left knee pain with 2 previous arthroscopies and previous injections in the past.  Most recent injection was in October 2022 and she states that this only provided 5 to 6 days of relief before her knee pain returned.  She now complains of worse pain since October and notes constant pain.  Patient is localized to the anterior medial and posterior aspects of her knee with occasional radiation down the anterior shin to the ankle.  She has no groin pain.  Pain does not wake her up at night.  She has about 10-minute walking endurance due to her knee pain.  Occasional swelling based on her activity.  Denies any mechanical symptoms but does note that the knee feels like it wants to give out on her at times.  Patient also complains of continued pain and symptoms from her neck and states that she has appointment in February to discuss neck surgery with her spine surgeon..                ROS: All systems reviewed are negative as they relate to the chief complaint within the history of present illness.  Patient denies fevers or chills.  Assessment & Plan: Visit Diagnoses:  1. Chronic pain of left knee     Plan: Patient is a 66 year old female who presents for evaluation of left knee pain.  Patient complains of continued left knee pain with only mild improvement since last cortisone injection in October.  Left knee radiographs do demonstrate severe osteoarthritis of the medial compartment with moderate arthritis of the patellofemoral compartment.  Discussed options available to  patient including the option for repeat cortisone injection versus gel injection versus physical therapy versus knee replacement.  Discussed knee replacement surgery in great depth including the risks and benefits of the procedure and the recovery timeframe.  With her ongoing cervical spine symptoms and likely upcoming surgery, she does not wish to proceed with evaluation for knee replacement at least at this time.  Plan to administer cortisone injection today.  She tolerated the injection well.  We will preapproved her for gel injections for the left knee and she will follow-up after the injections are approved to see if this will provide more relief than the cortisone has recently.  This patient is diagnosed with osteoarthritis of the knee(s).    Radiographs show evidence of joint space narrowing, osteophytes, subchondral sclerosis and/or subchondral cysts.  This patient has knee pain which interferes with functional and activities of daily living.    This patient has experienced inadequate response, adverse effects and/or intolerance with conservative treatments such as acetaminophen, NSAIDS, topical creams, physical therapy or regular exercise, knee bracing and/or weight loss.   This patient has experienced inadequate response or has a contraindication to intra articular steroid injections for at least 3 months.   This patient is not scheduled to have a total knee replacement within 6 months of starting treatment with viscosupplementation.  Follow-Up Instructions: No follow-ups on file.   Orders:  Orders Placed This Encounter  Procedures   XR KNEE 3 VIEW LEFT   No orders of the defined types were placed in this encounter.     Procedures: Large Joint Inj: L knee on 07/24/2021 11:28 AM Indications: diagnostic evaluation, joint swelling and pain Details: 18 G 1.5 in needle, superolateral approach  Arthrogram: No  Medications: 5 mL lidocaine 1 %; 40 mg methylPREDNISolone acetate 40  MG/ML; 4 mL bupivacaine 0.25 % Outcome: tolerated well, no immediate complications Procedure, treatment alternatives, risks and benefits explained, specific risks discussed. Consent was given by the patient. Immediately prior to procedure a time out was called to verify the correct patient, procedure, equipment, support staff and site/side marked as required. Patient was prepped and draped in the usual sterile fashion.      Clinical Data: No additional findings.  Objective: Vital Signs: There were no vitals taken for this visit.  Physical Exam:  Constitutional: Patient appears well-developed HEENT:  Head: Normocephalic Eyes:EOM are normal Neck: Normal range of motion Cardiovascular: Normal rate Pulmonary/chest: Effort normal Neurologic: Patient is alert Skin: Skin is warm Psychiatric: Patient has normal mood and affect  Ortho Exam: Ortho exam demonstrates left knee without effusion.  Tenderness over the medial and lateral joint lines.  Positive patellar femoral crepitus.  No calf tenderness.  Negative Homans' sign.  Range of motion from 2 degrees extension to 115 degrees of knee flexion.  Excellent quad strength rated 5/5.  No pain with hip range of motion.  Specialty Comments:  No specialty comments available.  Imaging: No results found.   PMFS History: Patient Active Problem List   Diagnosis Date Noted   Tenosynovitis, de Quervain 08/01/2020   Right hand pain 08/01/2020   Encounter to establish care 05/01/2020   Central spinal stenosis 05/01/2020   H/O cardiac arrest 05/01/2020   Special screening for malignant neoplasms, colon 05/01/2020   Screening mammogram for breast cancer 05/01/2020   Asthma 05/01/2020   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   Hip arthritis 11/09/2018   PTSD (post-traumatic stress disorder) 04/13/2017   Hallux rigidus, left foot 09/15/2016   Hallux rigidus, right foot 09/15/2016   HNP (herniated nucleus pulposus), lumbar 04/16/2016   Chronic neck  pain 06/22/2015   Arthritis 01/04/2013   Insomnia 01/04/2013   Past Medical History:  Diagnosis Date   Anxiety    Asthma    uses albuterol as needed "mostly weather induced".ALbuterol and Ventolin inhaler as needed   Chronic back pain    Chronic neck pain    Complication of anesthesia    post-op PEA arrest 04/16/16, thought due to respriatory arrest from sedation, narcotic, hypovolemia   Degenerative disc disease    cervical and lumbar area   Depression    History of bronchitis 2016   History of cervical spinal surgery 12/30/2019   History of lumbar surgery 12/30/2019   Insomnia    takes Ambien nightly   Muscle spasm    takes Robaxin daily    Pneumonia    hx of-2016   PONV (postoperative nausea and vomiting)    Weakness    numbness and tingling in legs    Family History  Problem Relation Age of Onset   Dementia Mother    Other Sister    Breast cancer Sister    Alcohol abuse Other    Breast cancer Paternal Aunt     Past Surgical History:  Procedure Laterality Date   BACK SURGERY  x 3 lumbar   CHOLECYSTECTOMY     DILATION AND CURETTAGE OF UTERUS     x 2   KNEE SURGERY Left    x 2   left knee arthroscopy      x 2   POSTERIOR CERVICAL FUSION/FORAMINOTOMY  05/17/2012   Procedure: POSTERIOR CERVICAL FUSION/FORAMINOTOMY LEVEL 2;  Surgeon: Hewitt Shorts, MD;  Location: [facility];  Service: Neurosurgery;  Laterality: N/A;  cervical six to Thoracic One posterior cervical arthrodesis with instrumentation   SHOULDER ARTHROSCOPY Left 10/27/2019   Procedure: left shoulder arthroscopy with arthroscopic vs open distal clavical excision;  Surgeon: Cammy Copa, MD;  Location: [facility] OR;  Service: Orthopedics;  Laterality: Left;   SHOULDER SURGERY     right   SPINE SURGERY     x 5   TOE FUSION Left 09/2016   TOTAL HIP ARTHROPLASTY Left 11/09/2018   Procedure: LEFT TOTAL HIP ARTHROPLASTY DIRECT ANTERIOR APPROACH;  Surgeon: Cammy Copa, MD;  Location: [facility];   Service: Orthopedics;  Laterality: Left;   Social History   Occupational History   Not on file  Tobacco Use   Smoking status: Never   Smokeless tobacco: Never  Vaping Use   Vaping Use: Never used  Substance and Sexual Activity   Alcohol use: No   Drug use: No   Sexual activity: Yes    Birth control/protection: Post-menopausal

## 2021-07-24 NOTE — Telephone Encounter (Signed)
Pt needs refill on zolpidem (AMBIEN) 10 MG tablet

## 2021-07-24 NOTE — Telephone Encounter (Signed)
Auth needed for left knee gel injection  

## 2021-07-29 NOTE — Telephone Encounter (Signed)
Noted  

## 2021-08-08 ENCOUNTER — Other Ambulatory Visit: Payer: Self-pay | Admitting: Surgical

## 2021-08-08 ENCOUNTER — Other Ambulatory Visit: Payer: Self-pay | Admitting: Internal Medicine

## 2021-08-08 DIAGNOSIS — G8929 Other chronic pain: Secondary | ICD-10-CM

## 2021-08-08 DIAGNOSIS — M4800 Spinal stenosis, site unspecified: Secondary | ICD-10-CM

## 2021-08-08 DIAGNOSIS — M180 Bilateral primary osteoarthritis of first carpometacarpal joints: Secondary | ICD-10-CM

## 2021-08-08 DIAGNOSIS — M542 Cervicalgia: Secondary | ICD-10-CM

## 2021-08-13 ENCOUNTER — Other Ambulatory Visit: Payer: Self-pay | Admitting: Neurological Surgery

## 2021-08-13 DIAGNOSIS — M4722 Other spondylosis with radiculopathy, cervical region: Secondary | ICD-10-CM

## 2021-08-16 ENCOUNTER — Ambulatory Visit
Admission: RE | Admit: 2021-08-16 | Discharge: 2021-08-16 | Disposition: A | Discharge status: Home / Self Care | Payer: Medicare Other | Source: Ambulatory Visit | Attending: Neurological Surgery | Admitting: Neurological Surgery

## 2021-08-16 DIAGNOSIS — M4722 Other spondylosis with radiculopathy, cervical region: Secondary | ICD-10-CM

## 2021-08-26 ENCOUNTER — Other Ambulatory Visit: Payer: Self-pay | Admitting: Neurological Surgery

## 2021-08-28 ENCOUNTER — Other Ambulatory Visit: Payer: Self-pay | Admitting: Neurological Surgery

## 2021-08-29 ENCOUNTER — Other Ambulatory Visit: Payer: Self-pay

## 2021-08-29 ENCOUNTER — Telehealth: Payer: Medicare Other | Admitting: Internal Medicine

## 2021-09-02 NOTE — Progress Notes (Signed)
Surgical Instructions ? ? ?Your procedure is scheduled on Thursday 09/05/2021. ? ?Report to [facility] Main Entrance "A" at 12:45 P.M., then check in with the Admitting office. ? ?Call [phone] if you have problems or questions between now and the morning of surgery: ?  ?Remember: ?Do not eat or drink after midnight the night before your surgery ? ?  ?Take these medicines the morning of surgery with A SIP OF WATER:  ?Duloxetine (Cymbalta) ?Gabapentin (Neurontin) ?Methocarbamol (Robaxin) ? ? ?If needed you may take these medications the morning of surgery: ?Albuterol (Proventil) nebulizer ?Albuterol (Ventolin HFA) inhaler - Please bring all inhalers with you the day of surgery.  ?Propylene Glycol (Systane Balance) eye drops ? ? ?As of today, STOP taking any Meloxicam (Mobic), Aspirin (unless otherwise instructed by your surgeon) or Aspirin-containing products; NSAIDS - Aleve, Naproxen, Ibuprofen, Motrin, Advil, Goody's, BC's, all herbal medications, fish oil, and all vitamins. ? ? ?After your pre-procedure COVID test ? ?You are not required to quarantine however you are required to wear a well-fitting mask when you are out and around people not in your household.  If your mask becomes wet or soiled, replace with a new one. ? ?Wash your hands often with soap and water for 20 seconds or clean your hands with an alcohol-based hand sanitizer that contains at least 60% alcohol. ? ?Do not share personal items. ? ?Notify your provider: ?if you are in close contact with someone who has COVID  ?or if you develop a fever of 100.4 or greater, sneezing, cough, sore throat, shortness of breath or body aches. ?         ?Do not wear jewelry or makeup ? ?Do not wear lotions, powders, perfumes, or deodorant. ? ?Do not shave 48 hours prior to surgery.  Men may shave face and neck. ? ?Do not wear nail polish, gel polish, artificial nails, or any other type of covering on natural nails including fingernails and toenails. ?If patients  have artificial nails, gel coating, etc. that need to be removed by a nail salon please have this removed prior to surgery or surgery may need to be canceled/delayed if the surgeon/ anesthesia feels like the patient is unable to be adequately monitored. ? ?Do not bring valuables to the hospital - [facility] is not responsible for any belongings or valuables. ? ?Do NOT Smoke (Tobacco/Vaping) or drink Alcohol 24 hours prior to your procedure ? ?If you use a CPAP at night, please bring your mask for your overnight stay. ?  ?Contacts, glasses, hearing aids, dentures or partials may not be worn into surgery, please bring cases for these belongings ?  ?For patients admitted to the hospital, discharge time will be determined by your treatment team. ?  ?Patients discharged the day of surgery will not be allowed to drive home, and someone needs to stay with them for 24 hours. ? ?NO VISITORS WILL BE ALLOWED IN PRE-OP WHERE PATIENTS ARE PREPPED FOR SURGERY.  ONLY 1 SUPPORT PERSON MAY BE PRESENT IN THE WAITING ROOM WHILE YOU ARE IN SURGERY.  IF YOU ARE TO BE ADMITTED, ONCE YOU ARE IN YOUR ROOM YOU WILL BE ALLOWED TWO (2) VISITORS. 1 (ONE) VISITOR MAY STAY OVERNIGHT BUT MUST ARRIVE TO THE ROOM BY 8pm.  Minor children may have two parents present. Special consideration for safety and communication needs will be reviewed on a case by case basis. ? ?Special instructions:   ? ?Oral Hygiene is also important to reduce your risk of infection.  Remember - BRUSH YOUR TEETH THE MORNING OF SURGERY WITH YOUR REGULAR TOOTHPASTE ? ? ?Stephanie Noble- Preparing For Surgery ? ?Before surgery, you can play an important role. Because skin is not sterile, your skin needs to be as free of germs as possible. You can reduce the number of germs on your skin by washing with CHG (chlorahexidine gluconate) Soap before surgery.  CHG is an antiseptic cleaner which kills germs and bonds with the skin to continue killing germs even after washing.   ? ? ?Please  do not use if you have an allergy to CHG or antibacterial soaps. If your skin becomes reddened/irritated stop using the CHG.  ?Do not shave (including legs and underarms) for at least 48 hours prior to first CHG shower. It is OK to shave your face. ? ?Please follow these instructions carefully. ?  ? ? Shower the NIGHT BEFORE SURGERY and the MORNING OF SURGERY with CHG Soap.  ? ?If you chose to wash your hair, wash your hair first as usual with your normal shampoo. After you shampoo, rinse your hair and body thoroughly to remove the shampoo.   ? ?Then Wash Face and genitals (private parts) with your normal soap and rinse thoroughly to remove soap. ? ?Next use the CHG Soap as you would any other liquid soap. You can apply CHG directly to the skin and wash gently with a clean washcloth.  ? ?Apply the CHG Soap to your body ONLY FROM THE NECK DOWN.  Do not use on open wounds or open sores. Avoid contact with your eyes, ears, mouth and genitals (private parts). Wash Face and genitals (private parts)  with your normal soap.  ? ?Wash thoroughly, paying special attention to the area where your surgery will be performed. ? ?Thoroughly rinse your body with warm water from the neck down. ? ?DO NOT shower/wash with your normal soap after using and rinsing off the CHG Soap. ? ?Pat yourself dry with a CLEAN TOWEL. ? ?Wear CLEAN PAJAMAS to bed the night before surgery ? ?Place CLEAN SHEETS on your bed the night before your surgery ? ?DO NOT SLEEP WITH PETS. ? ? ?Day of Surgery: ? ?Take a shower with CHG soap. ?Wear Clean/Comfortable clothing the morning of surgery ?Do not apply any deodorants/lotions.   ?Remember to brush your teeth WITH YOUR REGULAR TOOTHPASTE. ?  ?Please read over the fact sheets that you were given.  ? ?

## 2021-09-03 ENCOUNTER — Other Ambulatory Visit: Payer: Self-pay

## 2021-09-03 ENCOUNTER — Encounter (HOSPITAL_COMMUNITY): Payer: Self-pay

## 2021-09-03 ENCOUNTER — Encounter (HOSPITAL_COMMUNITY)
Admission: RE | Admit: 2021-09-03 | Discharge: 2021-09-03 | Disposition: A | Discharge status: Home / Self Care | Payer: Medicare Other | Source: Ambulatory Visit | Attending: Neurological Surgery | Admitting: Neurological Surgery

## 2021-09-03 VITALS — BP 111/84 | HR 70 | Temp 97.9°F | Resp 16 | Ht 65.0 in | Wt 152.4 lb

## 2021-09-03 DIAGNOSIS — M4722 Other spondylosis with radiculopathy, cervical region: Secondary | ICD-10-CM | POA: Insufficient documentation

## 2021-09-03 DIAGNOSIS — G4700 Insomnia, unspecified: Secondary | ICD-10-CM | POA: Insufficient documentation

## 2021-09-03 DIAGNOSIS — Z20822 Contact with and (suspected) exposure to covid-19: Secondary | ICD-10-CM | POA: Insufficient documentation

## 2021-09-03 DIAGNOSIS — Z01812 Encounter for preprocedural laboratory examination: Secondary | ICD-10-CM | POA: Insufficient documentation

## 2021-09-03 DIAGNOSIS — Z9049 Acquired absence of other specified parts of digestive tract: Secondary | ICD-10-CM | POA: Insufficient documentation

## 2021-09-03 DIAGNOSIS — J45909 Unspecified asthma, uncomplicated: Secondary | ICD-10-CM | POA: Insufficient documentation

## 2021-09-03 DIAGNOSIS — Z981 Arthrodesis status: Secondary | ICD-10-CM | POA: Insufficient documentation

## 2021-09-03 DIAGNOSIS — Z01818 Encounter for other preprocedural examination: Secondary | ICD-10-CM

## 2021-09-03 DIAGNOSIS — Z8674 Personal history of sudden cardiac arrest: Secondary | ICD-10-CM | POA: Insufficient documentation

## 2021-09-03 DIAGNOSIS — Z96642 Presence of left artificial hip joint: Secondary | ICD-10-CM | POA: Insufficient documentation

## 2021-09-03 LAB — TYPE AND SCREEN
ABO/RH(D): O POS
Antibody Screen: NEGATIVE

## 2021-09-03 LAB — SURGICAL PCR SCREEN
MRSA, PCR: NEGATIVE
Staphylococcus aureus: NEGATIVE

## 2021-09-03 LAB — CBC
HCT: 40.8 % (ref 36.0–46.0)
Hemoglobin: 13.6 g/dL (ref 12.0–15.0)
MCH: 31 pg (ref 26.0–34.0)
MCHC: 33.3 g/dL (ref 30.0–36.0)
MCV: 92.9 fL (ref 80.0–100.0)
Platelets: 232 10*3/uL (ref 150–400)
RBC: 4.39 MIL/uL (ref 3.87–5.11)
RDW: 12.5 % (ref 11.5–15.5)
WBC: 4 10*3/uL (ref 4.0–10.5)
nRBC: 0 % (ref 0.0–0.2)

## 2021-09-03 LAB — SARS CORONAVIRUS 2 (TAT 6-24 HRS): SARS Coronavirus 2: NEGATIVE

## 2021-09-03 NOTE — Progress Notes (Signed)
PCP - Trena Platt, MD ?Cardiologist - denies ? ?PPM/ICD - denies ?Device Orders - n/a ?Rep Notified - n/a ? ?Chest x-ray - n/a ?EKG - n/a ?Stress Test - denies ?ECHO - 04/20/2016 ?Cardiac Cath - denies ? ?Sleep Study - denies ?CPAP - n/a ? ?Fasting Blood Sugar - n/a ? ?Blood Thinner Instructions: n/a ? ?Aspirin Instructions: Patient was instructed: As of today, STOP taking any Meloxicam (Mobic), Aspirin (unless otherwise instructed by your surgeon) or Aspirin-containing products; NSAIDS - Aleve, Naproxen, Ibuprofen, Motrin, Advil, Goody's, BC's, all herbal medications, fish oil, and all vitamins. ? ?ERAS Protcol - n/a ? ?COVID TEST- done in PAT on 09/03/2021 ? ? ?Anesthesia review: yes - anesthesia complication ? ?Patient denies shortness of breath, fever, cough and chest pain at PAT appointment ? ? ?All instructions explained to the patient, with a verbal understanding of the material. Patient agrees to go over the instructions while at home for a better understanding. Patient also instructed to self quarantine after being tested for COVID-19. The opportunity to ask questions was provided. ?  ?

## 2021-09-04 ENCOUNTER — Ambulatory Visit: Payer: Medicare Other | Admitting: Internal Medicine

## 2021-09-04 NOTE — Progress Notes (Signed)
.Anesthesia Chart Review: ? Case: [id number] Date/Time: 09/05/21 1356  ? Procedure: ACDF C34, REM HARDWARE C45 - 3C  ? Anesthesia type: General  ? Pre-op diagnosis: CERVICAL SPONDYLOSIS WITH RADICULOPATHY  ? [facility] / [facility]  ? Surgeons: Bethann Goo, DO  ? ?  ? ? ?DISCUSSION: Patient is a 66 year old female scheduled for the above procedure. ?  ?History includes never smoker, post-operative N/V, asthma, insomnia, lumbar surgery (L3-5 PLIF 07/16/09; explant L5 hardware, L2-3 posterolateral arthrodesis 04/16/16), c-spine surgery (C4-7 ACDF 04/16/11, C6-T1 posterior arthrodesis 05/17/12), cholecystectomy (11/10/05), left THA (11/09/18, GETA), post-operative PEA arrest 04/16/16 (in PACU; likely hypoxemic induced PEA arrest due to acute respiratory failure following narcotic administration). ?  ?Following 04/16/16 lumbar fusion she had a PEA arrest. According to records, she had been given Percocet and Celebrex for back pain and Vistaril for facial itching in PACU at 1915. Patient feeling better at 1945, but at 2000, husband reported she "Just fell asleep after a hard deep breath". Patient noted to be have "purplish" lips and was unresponsive and pulseless. CODE Blue called and CPR initiated for approximately 5 minutes before ROSC.  Code documentation indicates first observed rhythm was asystole (she was not ion telemetry at the time of initial arrest) with SR with PVCs after ROSC. She received Narcan 0.4 mg. She vomited 3 minutes after ROSC. Glucose 169. PCCM was consulted. Allergic reaction was considered given her itching, but no rash or angioedema noted, so the felt most likely explanation for her cardiac arrest was due to acute respiratory failure due to narcotic administration leading to hypoxemic induced PEA arrest. EKG findings did show findings worrisome for ischemia. Cardiac enzymes 0.03-0.24 H-0.18H. Off pressors by 04/18/16. CXR showed low lung volumes without focal disease. 04/19/16 Echo showed  normal LVEF with normal wall motion. Cardiology was not consulted. She was discharged home on 04/20/16.  ?  ?Since then she underwent left THA on 11/09/18 and left shoulder arthroscopy on 10/27/19, both under GETA and without post-operative complication.  ?  ?09/03/2021 presurgical COVID-19 test negative.  Anesthesia team to evaluate on the day of surgery. ? ? ?VS: BP 111/84   Pulse 70   Temp 36.6 ?C   Resp 16   Ht 5\' 5"  (1.651 m)   Wt 69.1 kg   SpO2 100%   BMI 25.36 kg/m?  ? ? ?PROVIDERS: ? , MD is PCP ? ? ?LABS: Labs reviewed: Acceptable for surgery. ?(all labs ordered are listed, but only abnormal results are displayed) ? ?Labs Reviewed  ?SURGICAL PCR SCREEN  ?SARS CORONAVIRUS 2 (TAT 6-24 HRS)  ?CBC  ?TYPE AND SCREEN  ? ? ? ?IMAGES: ?MRI C-spine 08/16/21: ?IMPRESSION: ?1. Postsurgical changes reflecting C4 through C7 ACDF and posterior ?instrumented fusion at C6 through T1. ?2. Adjacent segment disease at C3-C4 has slightly progressed since ?2021, with moderate to severe spinal canal stenosis and moderate to ?severe left and mild right neural foraminal stenosis. There also ?bilateral facet joint effusions at this level, left larger than ?right. No evidence of cord signal abnormality. ?3. No other significant spinal canal or neural foraminal stenosis ?identified in the cervical spine. ?  ? ?EKG: 11/05/18: NSR at 68 bpm ? ? ?CV: ?Echo 04/19/16: ?Study Conclusions  ?- Left ventricle: The cavity size was normal. Wall thickness was  ?  normal. Systolic function was normal. The estimated ejection  ?  fraction was in the range of 60% to 65%. Wall motion was normal;  ?  there were no regional wall motion abnormalities. Left  ?  ventricular diastolic function parameters were normal.  ?- Aortic valve: Valve area (VTI): 3.78 cm^2. Valve area (Vmax):  ?  4.28 cm^2.  ?- Atrial septum: No defect or patent foramen ovale was identified.  ?- Pulmonary arteries: Systolic pressure was mildly increased. PA  ?  peak  pressure: 36 mm Hg (S).  ?- Technically adequate study.  ? ? ?Past Medical History:  ?Diagnosis Date  ? Anxiety   ? Asthma   ? uses albuterol as needed "mostly weather induced".ALbuterol and Ventolin inhaler as needed  ? Chronic back pain   ? Chronic neck pain   ? Complication of anesthesia   ? post-op PEA arrest 04/16/16, thought due to respriatory arrest from sedation, narcotic, hypovolemia  ? Degenerative disc disease   ? cervical and lumbar area  ? Depression   ? History of bronchitis 2016  ? History of cervical spinal surgery 12/30/2019  ? History of lumbar surgery 12/30/2019  ? Insomnia   ? takes Ambien nightly  ? Muscle spasm   ? takes Robaxin daily   ? Pneumonia   ? hx of-2016  ? PONV (postoperative nausea and vomiting)   ? Weakness   ? numbness and tingling in legs  ? ? ?Past Surgical History:  ?Procedure Laterality Date  ? BACK SURGERY    ? x 3 lumbar  ? CHOLECYSTECTOMY    ? DILATION AND CURETTAGE OF UTERUS    ? x 2  ? JOINT REPLACEMENT    ? KNEE SURGERY Left   ? x 2  ? left knee arthroscopy     ? x 2  ? POSTERIOR CERVICAL FUSION/FORAMINOTOMY  05/17/2012  ? Procedure: POSTERIOR CERVICAL FUSION/FORAMINOTOMY LEVEL 2;  Surgeon: Hewitt Shorts, MD;  Location: [facility];  Service: Neurosurgery;  Laterality: N/A;  cervical six to Thoracic One posterior cervical arthrodesis with instrumentation  ? SHOULDER ARTHROSCOPY Left 10/27/2019  ? Procedure: left shoulder arthroscopy with arthroscopic vs open distal clavical excision;  Surgeon: Cammy Copa, MD;  Location: [facility] OR;  Service: Orthopedics;  Laterality: Left;  ? SHOULDER SURGERY    ? right  ? SPINE SURGERY    ? x 5  ? TOE FUSION Left 09/2016  ? TOTAL HIP ARTHROPLASTY Left 11/09/2018  ? Procedure: LEFT TOTAL HIP ARTHROPLASTY DIRECT ANTERIOR APPROACH;  Surgeon: Cammy Copa, MD;  Location: [facility] ORS;  Service: Orthopedics;  Laterality: Left;  ? ? ?MEDICATIONS: ? albuterol (PROVENTIL) (2.5 MG/3ML) 0.083% nebulizer solution  ? albuterol (VENTOLIN HFA)  108 (90 Base) MCG/ACT inhaler  ? DULoxetine (CYMBALTA) 20 MG capsule  ? gabapentin (NEURONTIN) 300 MG capsule  ? meloxicam (MOBIC) 7.5 MG tablet  ? methocarbamol (ROBAXIN) 750 MG tablet  ? Omega-3 Fatty Acids (FISH OIL) 1200 MG CAPS  ? Propylene Glycol (SYSTANE BALANCE) 0.6 % SOLN  ? zolpidem (AMBIEN) 10 MG tablet  ? ?No current facility-administered medications for this encounter.  ? ? ?Shonna Chock, PA-C ?Surgical Short Stay/Anesthesiology ?[facility] Phone [phone] ?[facility] Phone [phone] ?09/04/2021 11:08 AM ? ? ? ? ? ? ?

## 2021-09-04 NOTE — Anesthesia Preprocedure Evaluation (Addendum)
Anesthesia Evaluation  ?Patient identified by MRN, date of birth, ID band ?Patient awake ? ? ? ?Reviewed: ?Allergy & Precautions, H&P , NPO status , Patient's Chart, lab work & pertinent test results ? ?History of Anesthesia Complications ?(+) PONV and history of anesthetic complications ? ?Airway ?Mallampati: II ? ?TM Distance: >3 FB ?Neck ROM: Full ? ? ? Dental ?no notable dental hx. ?(+) Teeth Intact, Dental Advisory Given ?  ?Pulmonary ?asthma ,  ?  ?Pulmonary exam normal ?breath sounds clear to auscultation ? ? ? ? ? ? Cardiovascular ?negative cardio ROS ? ? ?Rhythm:Regular Rate:Normal ? ? ?  ?Neuro/Psych ?Anxiety Depression negative neurological ROS ?   ? GI/Hepatic ?negative GI ROS, Neg liver ROS,   ?Endo/Other  ?negative endocrine ROS ? Renal/GU ?negative Renal ROS  ?negative genitourinary ?  ?Musculoskeletal ? ?(+) Arthritis , Osteoarthritis,   ? Abdominal ?  ?Peds ? Hematology ?negative hematology ROS ?(+)   ?Anesthesia Other Findings ? ? Reproductive/Obstetrics ?negative OB ROS ? ?  ? ? ? ? ? ? ? ? ? ? ? ? ? ?  ?  ? ? ? ? ? ? ? ?Anesthesia Physical ?Anesthesia Plan ? ?ASA: 2 ? ?Anesthesia Plan: General  ? ?Post-op Pain Management: Tylenol PO (pre-op)*  ? ?Induction: Intravenous ? ?PONV Risk Score and Plan: 4 or greater and Ondansetron, Dexamethasone and Midazolam ? ?Airway Management Planned: Oral ETT and Video Laryngoscope Planned ? ?Additional Equipment:  ? ?Intra-op Plan:  ? ?Post-operative Plan: Extubation in OR ? ?Informed Consent: I have reviewed the patients History and Physical, chart, labs and discussed the procedure including the risks, benefits and alternatives for the proposed anesthesia with the patient or authorized representative who has indicated his/her understanding and acceptance.  ? ? ? ?Dental advisory given ? ?Plan Discussed with: CRNA ? ?Anesthesia Plan Comments: (PAT note written 09/04/2021 by Shonna Chock, PA-C. ?)  ? ? ? ? ? ?Anesthesia Quick  Evaluation ? ?

## 2021-09-05 ENCOUNTER — Inpatient Hospital Stay (HOSPITAL_COMMUNITY): Payer: Medicare Other | Admitting: Anesthesiology

## 2021-09-05 ENCOUNTER — Inpatient Hospital Stay (HOSPITAL_COMMUNITY)
Admission: RE | Admit: 2021-09-05 | Discharge: 2021-09-06 | DRG: 473 | Disposition: A | Discharge status: Home / Self Care | Payer: Medicare Other | Attending: Neurological Surgery | Admitting: Neurological Surgery

## 2021-09-05 ENCOUNTER — Other Ambulatory Visit: Payer: Self-pay | Admitting: Orthopedic Surgery

## 2021-09-05 ENCOUNTER — Encounter (HOSPITAL_COMMUNITY)
Admission: RE | Disposition: A | Discharge status: Home / Self Care | Payer: Self-pay | Source: Home / Self Care | Attending: Neurological Surgery

## 2021-09-05 ENCOUNTER — Encounter (HOSPITAL_COMMUNITY): Payer: Self-pay | Admitting: Neurological Surgery

## 2021-09-05 ENCOUNTER — Inpatient Hospital Stay (HOSPITAL_COMMUNITY): Payer: Medicare Other | Admitting: Vascular Surgery

## 2021-09-05 ENCOUNTER — Other Ambulatory Visit: Payer: Self-pay

## 2021-09-05 ENCOUNTER — Inpatient Hospital Stay (HOSPITAL_COMMUNITY): Payer: Medicare Other

## 2021-09-05 DIAGNOSIS — Z981 Arthrodesis status: Secondary | ICD-10-CM

## 2021-09-05 DIAGNOSIS — Z20822 Contact with and (suspected) exposure to covid-19: Secondary | ICD-10-CM | POA: Diagnosis present

## 2021-09-05 DIAGNOSIS — M4802 Spinal stenosis, cervical region: Secondary | ICD-10-CM | POA: Diagnosis present

## 2021-09-05 DIAGNOSIS — M5031 Other cervical disc degeneration,  high cervical region: Secondary | ICD-10-CM | POA: Diagnosis present

## 2021-09-05 DIAGNOSIS — Z888 Allergy status to other drugs, medicaments and biological substances status: Secondary | ICD-10-CM | POA: Diagnosis not present

## 2021-09-05 DIAGNOSIS — J45909 Unspecified asthma, uncomplicated: Secondary | ICD-10-CM | POA: Diagnosis present

## 2021-09-05 DIAGNOSIS — M47812 Spondylosis without myelopathy or radiculopathy, cervical region: Principal | ICD-10-CM

## 2021-09-05 DIAGNOSIS — M4722 Other spondylosis with radiculopathy, cervical region: Secondary | ICD-10-CM | POA: Diagnosis present

## 2021-09-05 DIAGNOSIS — Z96642 Presence of left artificial hip joint: Secondary | ICD-10-CM | POA: Diagnosis present

## 2021-09-05 DIAGNOSIS — Z791 Long term (current) use of non-steroidal anti-inflammatories (NSAID): Secondary | ICD-10-CM

## 2021-09-05 DIAGNOSIS — Z803 Family history of malignant neoplasm of breast: Secondary | ICD-10-CM | POA: Diagnosis not present

## 2021-09-05 DIAGNOSIS — Z419 Encounter for procedure for purposes other than remedying health state, unspecified: Secondary | ICD-10-CM

## 2021-09-05 DIAGNOSIS — Z79899 Other long term (current) drug therapy: Secondary | ICD-10-CM | POA: Diagnosis not present

## 2021-09-05 DIAGNOSIS — Z820 Family history of epilepsy and other diseases of the nervous system: Secondary | ICD-10-CM

## 2021-09-05 DIAGNOSIS — Z8674 Personal history of sudden cardiac arrest: Secondary | ICD-10-CM

## 2021-09-05 DIAGNOSIS — Z96652 Presence of left artificial knee joint: Secondary | ICD-10-CM | POA: Diagnosis present

## 2021-09-05 DIAGNOSIS — M180 Bilateral primary osteoarthritis of first carpometacarpal joints: Secondary | ICD-10-CM

## 2021-09-05 HISTORY — PX: ANTERIOR CERVICAL DECOMP/DISCECTOMY FUSION: SHX1161

## 2021-09-05 SURGERY — ANTERIOR CERVICAL DECOMPRESSION/DISCECTOMY FUSION 1 LEVEL/HARDWARE REMOVAL
Anesthesia: General | Site: Spine Cervical

## 2021-09-05 MED ORDER — CHLORHEXIDINE GLUCONATE 0.12 % MT SOLN
OROMUCOSAL | Status: AC
  Administered 2021-09-05: 13:00:00 15 mL via OROMUCOSAL
  Filled 2021-09-05: qty 15

## 2021-09-05 MED ORDER — LACTATED RINGERS IV SOLN: INTRAVENOUS | Status: DC

## 2021-09-05 MED ORDER — GABAPENTIN 300 MG PO CAPS
300.0000 mg | ORAL_CAPSULE | Freq: Every morning | ORAL | Status: DC
  Administered 2021-09-06: 300 mg via ORAL
  Filled 2021-09-05: qty 1

## 2021-09-05 MED ORDER — ACETAMINOPHEN 325 MG PO TABS
650.0000 mg | ORAL_TABLET | ORAL | Status: DC | PRN
  Administered 2021-09-06: 650 mg via ORAL
  Filled 2021-09-05: qty 2

## 2021-09-05 MED ORDER — KETOROLAC TROMETHAMINE 15 MG/ML IJ SOLN: 7.5000 mg | Freq: Four times a day (QID) | INTRAMUSCULAR | Status: DC

## 2021-09-05 MED ORDER — HYDROCODONE-ACETAMINOPHEN 5-325 MG PO TABS: 2.0000 | ORAL_TABLET | ORAL | Status: DC | PRN

## 2021-09-05 MED ORDER — ACETAMINOPHEN 650 MG RE SUPP: 650.0000 mg | RECTAL | Status: DC | PRN

## 2021-09-05 MED ORDER — DULOXETINE HCL 20 MG PO CPEP
20.0000 mg | ORAL_CAPSULE | Freq: Every day | ORAL | Status: DC
  Administered 2021-09-06: 20 mg via ORAL
  Filled 2021-09-05: qty 1

## 2021-09-05 MED ORDER — PROPOFOL 10 MG/ML IV BOLUS
INTRAVENOUS | Status: DC | PRN
  Administered 2021-09-05: 200 mg via INTRAVENOUS

## 2021-09-05 MED ORDER — CEFAZOLIN SODIUM-DEXTROSE 2-4 GM/100ML-% IV SOLN
2.0000 g | INTRAVENOUS | Status: AC
  Administered 2021-09-05: 16:00:00 2 g via INTRAVENOUS

## 2021-09-05 MED ORDER — ACETAMINOPHEN 500 MG PO TABS
1000.0000 mg | ORAL_TABLET | Freq: Once | ORAL | Status: AC
  Administered 2021-09-05: 13:00:00 1000 mg via ORAL
  Filled 2021-09-05: qty 2

## 2021-09-05 MED ORDER — CHLORHEXIDINE GLUCONATE CLOTH 2 % EX PADS: 6.0000 | MEDICATED_PAD | Freq: Once | CUTANEOUS | Status: DC

## 2021-09-05 MED ORDER — ONDANSETRON HCL 4 MG PO TABS: 4.0000 mg | ORAL_TABLET | Freq: Four times a day (QID) | ORAL | Status: DC | PRN

## 2021-09-05 MED ORDER — HYDROMORPHONE HCL 1 MG/ML IJ SOLN
INTRAMUSCULAR | Status: AC
  Filled 2021-09-05: qty 1

## 2021-09-05 MED ORDER — MIDAZOLAM HCL 2 MG/2ML IJ SOLN
INTRAMUSCULAR | Status: AC
  Filled 2021-09-05: qty 2

## 2021-09-05 MED ORDER — MENTHOL 3 MG MT LOZG
1.0000 | LOZENGE | OROMUCOSAL | Status: DC | PRN
  Filled 2021-09-05: qty 9

## 2021-09-05 MED ORDER — 0.9 % SODIUM CHLORIDE (POUR BTL) OPTIME
TOPICAL | Status: DC | PRN
  Administered 2021-09-05: 17:00:00 1000 mL

## 2021-09-05 MED ORDER — PHENOL 1.4 % MT LIQD: 1.0000 | OROMUCOSAL | Status: DC | PRN

## 2021-09-05 MED ORDER — CEFAZOLIN SODIUM-DEXTROSE 2-4 GM/100ML-% IV SOLN
2.0000 g | Freq: Three times a day (TID) | INTRAVENOUS | Status: AC
  Administered 2021-09-06 (×2): 2 g via INTRAVENOUS
  Filled 2021-09-05 (×3): qty 100

## 2021-09-05 MED ORDER — SODIUM CHLORIDE 0.9% FLUSH
3.0000 mL | Freq: Two times a day (BID) | INTRAVENOUS | Status: DC
  Administered 2021-09-05 – 2021-09-06 (×2): 3 mL via INTRAVENOUS

## 2021-09-05 MED ORDER — ROCURONIUM BROMIDE 10 MG/ML (PF) SYRINGE
PREFILLED_SYRINGE | INTRAVENOUS | Status: DC | PRN
  Administered 2021-09-05: 80 mg via INTRAVENOUS
  Administered 2021-09-05: 20 mg via INTRAVENOUS

## 2021-09-05 MED ORDER — ORAL CARE MOUTH RINSE: 15.0000 mL | Freq: Once | OROMUCOSAL | Status: AC

## 2021-09-05 MED ORDER — CHLORHEXIDINE GLUCONATE 0.12 % MT SOLN: 15.0000 mL | Freq: Once | OROMUCOSAL | Status: AC

## 2021-09-05 MED ORDER — TRIAMCINOLONE ACETONIDE 40 MG/ML IJ SUSP
INTRAMUSCULAR | Status: AC
  Filled 2021-09-05: qty 5

## 2021-09-05 MED ORDER — PHENYLEPHRINE HCL-NACL 20-0.9 MG/250ML-% IV SOLN
INTRAVENOUS | Status: DC | PRN
  Administered 2021-09-05: 50 ug/min via INTRAVENOUS

## 2021-09-05 MED ORDER — EPHEDRINE SULFATE-NACL 50-0.9 MG/10ML-% IV SOSY
PREFILLED_SYRINGE | INTRAVENOUS | Status: DC | PRN
  Administered 2021-09-05: 10 mg via INTRAVENOUS

## 2021-09-05 MED ORDER — LACTATED RINGERS IV SOLN: INTRAVENOUS | Status: DC | PRN

## 2021-09-05 MED ORDER — GABAPENTIN 300 MG PO CAPS
900.0000 mg | ORAL_CAPSULE | Freq: Every day | ORAL | Status: DC
  Administered 2021-09-05: 900 mg via ORAL
  Filled 2021-09-05: qty 3

## 2021-09-05 MED ORDER — SUGAMMADEX SODIUM 200 MG/2ML IV SOLN
INTRAVENOUS | Status: DC | PRN
Start: 2021-09-05 — End: 2021-09-05
  Administered 2021-09-05: 400 mg via INTRAVENOUS

## 2021-09-05 MED ORDER — GABAPENTIN 300 MG PO CAPS: 300.0000 mg | ORAL_CAPSULE | ORAL | Status: DC

## 2021-09-05 MED ORDER — DIPHENHYDRAMINE HCL 50 MG/ML IJ SOLN
INTRAMUSCULAR | Status: AC
  Filled 2021-09-05: qty 1

## 2021-09-05 MED ORDER — METHOCARBAMOL 1000 MG/10ML IJ SOLN
500.0000 mg | Freq: Four times a day (QID) | INTRAVENOUS | Status: DC | PRN
  Administered 2021-09-05: 22:00:00 500 mg via INTRAVENOUS
  Filled 2021-09-05: qty 500

## 2021-09-05 MED ORDER — DOCUSATE SODIUM 100 MG PO CAPS
100.0000 mg | ORAL_CAPSULE | Freq: Two times a day (BID) | ORAL | Status: DC
  Administered 2021-09-05 – 2021-09-06 (×2): 100 mg via ORAL
  Filled 2021-09-05 (×2): qty 1

## 2021-09-05 MED ORDER — THROMBIN 5000 UNITS EX SOLR: OROMUCOSAL | Status: DC | PRN

## 2021-09-05 MED ORDER — LIDOCAINE 2% (20 MG/ML) 5 ML SYRINGE
INTRAMUSCULAR | Status: DC | PRN
  Administered 2021-09-05: 30 mg via INTRAVENOUS

## 2021-09-05 MED ORDER — DIPHENHYDRAMINE HCL 50 MG/ML IJ SOLN
12.5000 mg | Freq: Once | INTRAMUSCULAR | Status: AC
  Administered 2021-09-05: 18:00:00 12.5 mg via INTRAVENOUS

## 2021-09-05 MED ORDER — FAMOTIDINE IN NACL 20-0.9 MG/50ML-% IV SOLN
20.0000 mg | Freq: Once | INTRAVENOUS | Status: AC
  Administered 2021-09-05: 19:00:00 20 mg via INTRAVENOUS
  Filled 2021-09-05: qty 50

## 2021-09-05 MED ORDER — SODIUM CHLORIDE 0.9 % IV SOLN: 250.0000 mL | INTRAVENOUS | Status: DC

## 2021-09-05 MED ORDER — DEXAMETHASONE SODIUM PHOSPHATE 10 MG/ML IJ SOLN
INTRAMUSCULAR | Status: DC | PRN
  Administered 2021-09-05: 10 mg via INTRAVENOUS

## 2021-09-05 MED ORDER — SODIUM CHLORIDE 0.9% FLUSH: 3.0000 mL | INTRAVENOUS | Status: DC | PRN

## 2021-09-05 MED ORDER — GABAPENTIN 600 MG PO TABS: 600.0000 mg | ORAL_TABLET | Freq: Every day | ORAL | Status: DC

## 2021-09-05 MED ORDER — ONDANSETRON HCL 4 MG/2ML IJ SOLN: 4.0000 mg | Freq: Four times a day (QID) | INTRAMUSCULAR | Status: DC | PRN

## 2021-09-05 MED ORDER — HYDROCODONE-ACETAMINOPHEN 5-325 MG PO TABS: 1.0000 | ORAL_TABLET | ORAL | Status: DC | PRN

## 2021-09-05 MED ORDER — HYDROMORPHONE HCL 1 MG/ML IJ SOLN
0.2500 mg | INTRAMUSCULAR | Status: DC | PRN
  Administered 2021-09-05: 18:00:00 0.25 mg via INTRAVENOUS

## 2021-09-05 MED ORDER — MORPHINE SULFATE (PF) 2 MG/ML IV SOLN: 1.0000 mg | INTRAVENOUS | Status: DC | PRN

## 2021-09-05 MED ORDER — METHOCARBAMOL 500 MG PO TABS
500.0000 mg | ORAL_TABLET | Freq: Four times a day (QID) | ORAL | Status: DC | PRN
  Administered 2021-09-06: 500 mg via ORAL
  Filled 2021-09-05: qty 1

## 2021-09-05 MED ORDER — ONDANSETRON HCL 4 MG/2ML IJ SOLN
INTRAMUSCULAR | Status: DC | PRN
  Administered 2021-09-05: 4 mg via INTRAVENOUS

## 2021-09-05 MED ORDER — ALBUTEROL SULFATE (2.5 MG/3ML) 0.083% IN NEBU: 2.5000 mg | INHALATION_SOLUTION | RESPIRATORY_TRACT | Status: DC | PRN

## 2021-09-05 MED ORDER — KETOROLAC TROMETHAMINE 15 MG/ML IJ SOLN
7.5000 mg | Freq: Four times a day (QID) | INTRAMUSCULAR | Status: DC
  Administered 2021-09-05 – 2021-09-06 (×3): 7.5 mg via INTRAVENOUS
  Filled 2021-09-05 (×3): qty 1

## 2021-09-05 MED ORDER — SODIUM CHLORIDE 0.9 % IV SOLN: INTRAVENOUS | Status: DC

## 2021-09-05 MED ORDER — FENTANYL CITRATE (PF) 250 MCG/5ML IJ SOLN
INTRAMUSCULAR | Status: DC | PRN
  Administered 2021-09-05: 150 ug via INTRAVENOUS
  Administered 2021-09-05: 100 ug via INTRAVENOUS

## 2021-09-05 MED ORDER — THROMBIN 5000 UNITS EX SOLR
CUTANEOUS | Status: AC
  Filled 2021-09-05: qty 5000

## 2021-09-05 MED ORDER — CEFAZOLIN SODIUM-DEXTROSE 2-4 GM/100ML-% IV SOLN
INTRAVENOUS | Status: AC
  Filled 2021-09-05: qty 100

## 2021-09-05 MED ORDER — FENTANYL CITRATE (PF) 250 MCG/5ML IJ SOLN
INTRAMUSCULAR | Status: AC
  Filled 2021-09-05: qty 5

## 2021-09-05 SURGICAL SUPPLY — 54 items
BAG COUNTER SPONGE SURGICOUNT (BAG) ×3 IMPLANT
BAG SPNG CNTER NS LX DISP (BAG) ×1
BAND INSRT 18 STRL LF DISP RB (MISCELLANEOUS) ×2
BAND RUBBER #18 3X1/16 STRL (MISCELLANEOUS) ×6 IMPLANT
BIT DRILL NEURO 2X3.1 SFT TUCH (MISCELLANEOUS) ×2 IMPLANT
BUR CARBIDE MATCH 3.0 (BURR) ×3 IMPLANT
CANISTER SUCT 3000ML PPV (MISCELLANEOUS) ×3 IMPLANT
COVER MAYO STAND STRL (DRAPES) ×6 IMPLANT
DEVICE ENDSKLTN IMPL 16X14X7X6 (Cage) IMPLANT
DIFFUSER DRILL AIR PNEUMATIC (MISCELLANEOUS) ×3 IMPLANT
DRAPE C-ARM 42X72 X-RAY (DRAPES) ×3 IMPLANT
DRAPE HALF SHEET 40X57 (DRAPES) IMPLANT
DRAPE LAPAROTOMY 100X72X124 (DRAPES) ×3 IMPLANT
DRAPE MICROSCOPE LEICA (MISCELLANEOUS) ×3 IMPLANT
DRILL NEURO 2X3.1 SOFT TOUCH (MISCELLANEOUS) ×2
DRSG OPSITE POSTOP 4X6 (GAUZE/BANDAGES/DRESSINGS) ×1 IMPLANT
DURAPREP 6ML APPLICATOR 50/CS (WOUND CARE) ×3 IMPLANT
ELECT COATED BLADE 2.86 ST (ELECTRODE) ×3 IMPLANT
ELECT REM PT RETURN 9FT ADLT (ELECTROSURGICAL) ×2
ELECTRODE REM PT RTRN 9FT ADLT (ELECTROSURGICAL) ×2 IMPLANT
ENDOSKELETON IMPLANT 16X14X7X6 (Cage) ×2 IMPLANT
EVACUATOR 1/8 PVC DRAIN (DRAIN) IMPLANT
GAUZE 4X4 16PLY ~~LOC~~+RFID DBL (SPONGE) ×1 IMPLANT
GLOVE SRG 8 PF TXTR STRL LF DI (GLOVE) ×2 IMPLANT
GLOVE SURG LTX SZ8 (GLOVE) ×6 IMPLANT
GLOVE SURG UNDER POLY LF SZ8 (GLOVE) ×4
GOWN STRL REUS W/ TWL LRG LVL3 (GOWN DISPOSABLE) IMPLANT
GOWN STRL REUS W/ TWL XL LVL3 (GOWN DISPOSABLE) ×2 IMPLANT
GOWN STRL REUS W/TWL 2XL LVL3 (GOWN DISPOSABLE) IMPLANT
GOWN STRL REUS W/TWL LRG LVL3 (GOWN DISPOSABLE) ×4
GOWN STRL REUS W/TWL XL LVL3 (GOWN DISPOSABLE) ×4
HEMOSTAT POWDER KIT SURGIFOAM (HEMOSTASIS) ×3 IMPLANT
KIT BASIN OR (CUSTOM PROCEDURE TRAY) ×3 IMPLANT
KIT TURNOVER KIT B (KITS) ×3 IMPLANT
NDL SPNL 18GX3.5 QUINCKE PK (NEEDLE) ×2 IMPLANT
NEEDLE HYPO 22GX1.5 SAFETY (NEEDLE) ×3 IMPLANT
NEEDLE SPNL 18GX3.5 QUINCKE PK (NEEDLE) ×2 IMPLANT
NS IRRIG 1000ML POUR BTL (IV SOLUTION) ×3 IMPLANT
PACK LAMINECTOMY NEURO (CUSTOM PROCEDURE TRAY) ×3 IMPLANT
PIN DISTRACTION 14MM (PIN) ×6 IMPLANT
PLATE ZEVO 1LVL 23MM (Plate) ×1 IMPLANT
PUTTY BONE 100 VESUVIUS 1CC (Putty) ×1 IMPLANT
RASP 3.0MM (RASP) ×1 IMPLANT
SCREW 3.5 SELFDRILL 15MM VARI (Screw) ×2 IMPLANT
SCREW 4.0 SELFDRILL 15MM VARI (Screw) ×2 IMPLANT
SPONGE INTESTINAL PEANUT (DISPOSABLE) ×4 IMPLANT
SPONGE SURGIFOAM ABS GEL SZ50 (HEMOSTASIS) ×3 IMPLANT
STAPLER VISISTAT 35W (STAPLE) ×1 IMPLANT
STRIP CLOSURE SKIN 1/2X4 (GAUZE/BANDAGES/DRESSINGS) ×3 IMPLANT
TAPE SURG TRANSPORE 1 IN (GAUZE/BANDAGES/DRESSINGS) ×2 IMPLANT
TAPE SURGICAL TRANSPORE 1 IN (GAUZE/BANDAGES/DRESSINGS) ×2
TOWEL GREEN STERILE (TOWEL DISPOSABLE) ×3 IMPLANT
TOWEL GREEN STERILE FF (TOWEL DISPOSABLE) ×3 IMPLANT
WATER STERILE IRR 1000ML POUR (IV SOLUTION) ×3 IMPLANT

## 2021-09-05 NOTE — Transfer of Care (Signed)
Immediate Anesthesia Transfer of Care Note ? ?Patient: Stephanie Noble ? ?Procedure(s) Performed: CERVICAL THREE-FOUR ANTERIOR CERVICAL DECOMPRESSION/DISCECTOMY FUSION WITH PARTIAL PLATE REMOVAL AT CERVICAL FOUR-FIVE (Spine Cervical) ? ?Patient Location: PACU ? ?Anesthesia Type:General ? ?Level of Consciousness: awake and alert  ? ?Airway & Oxygen Therapy: Patient Spontanous Breathing ? ?Post-op Assessment: Report given to RN and Post -op Vital signs reviewed and stable ? ?Post vital signs: Reviewed and stable ? ?Last Vitals:  ?Vitals Value Taken Time  ?BP 138/92 09/05/21 1740  ?Temp    ?Pulse 102 09/05/21 1743  ?Resp 11 09/05/21 1743  ?SpO2 100 % 09/05/21 1743  ?Vitals shown include unvalidated device data. ? ?Last Pain:  ?Vitals:  ? 09/05/21 1248  ?TempSrc:   ?PainSc: 8   ?   ? ?  ? ?Complications: No notable events documented. ?

## 2021-09-05 NOTE — Progress Notes (Deleted)
Orthopedic Tech Progress Note ?Patient Details:  ?Stephanie Noble ?06/05/1956 ?[id number] ? ?Pt already has an Designer, multimedia in her room per [place]ona Bar, Therapist, sports. ? ?Patient ID: Stephanie Noble, female   DOB: 05-13-1956, 66 y.o.   MRN: [id number] ? ?Stephanie Noble ?09/05/2021, 9:10 PM ? ?

## 2021-09-05 NOTE — Progress Notes (Signed)
Orthopedic Tech Progress Note ?Patient Details:  ?Stephanie Noble ?[birth year] ?[id number] ? ?Initially was under the impression pt already had the aspen cervical collar, but actually had a [place] J collar. I dropped off the correct Aspen cervical collar to pt's room at bedside for Stephanie Noble (RN) to apply as I cannot. ? ?Ortho Devices ?Type of Ortho Device: Aspen cervical collar ?Ortho Device/Splint Location: at bedside for RN to apply ?Ortho Device/Splint Interventions: Ordered ?  ?Post Interventions ?Instructions Provided: Care of device ? ?Stephanie Noble [name] ?09/05/2021, 10:05 PM ? ?

## 2021-09-05 NOTE — Progress Notes (Signed)
? ?  Providing Compassionate, Quality Care - Together ? ?NEUROSURGERY PROGRESS NOTE ? ? ?S: Patient seen and examined in recovery room.  No acute distress ? ?O: EXAM:  ?BP (!) 138/92 (BP Location: Right Arm)   Pulse 99   Temp 97.8 ?F (36.6 ?C)   Resp 12   Ht 5\' 5"  (1.651 m)   Wt 65.8 kg   SpO2 100%   BMI 24.13 kg/m?  ? ?Awake, alert ?Vital signs stable ?PERRL ?Speech fluent, appropriate  ?CNs grossly intact  ?Moving all extremities equally, symmetric, full strength ? ?ASSESSMENT:  ?66 y.o. female with  ? 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 ?-Status post C3-4 ACDF, revision of hardware C4-5 ? ?PLAN: ?-Floor status ?-Continuous pulse ox and cardiac monitoring given her history of cardiac arrest ?-Minimize pain control ?-Cervical collar ?-PT evaluation ? ? ? ?Thank you for allowing me to participate in this patient's care.  Please do not hesitate to call with questions or concerns. ? ? ?Tuana Hoheisel, DO ?Neurosurgeon ?[facility] ?Cell: [phone] ? ?

## 2021-09-05 NOTE — Anesthesia Procedure Notes (Signed)
Procedure Name: Intubation ?Date/Time: 09/05/2021 3:52 PM ?Performed by: Eligha Bridegroom, CRNA ?Pre-anesthesia Checklist: Patient identified, Emergency Drugs available, Suction available, Patient being monitored and Timeout performed ?Patient Re-evaluated:Patient Re-evaluated prior to induction ?Oxygen Delivery Method: Circle system utilized ?Induction Type: IV induction ?Laryngoscope Size: Glidescope ?Grade View: Grade II ?Tube type: Oral ?Tube size: 7.0 mm ?Number of attempts: 1 ?Airway Equipment and Method: Stylet and Video-laryngoscopy ?Placement Confirmation: ETT inserted through vocal cords under direct vision, positive ETCO2 and breath sounds checked- equal and bilateral ?Secured at: 21 cm ?Tube secured with: Tape ?Dental Injury: Teeth and Oropharynx as per pre-operative assessment  ? ? ? ? ?

## 2021-09-05 NOTE — H&P (Signed)
? ? ?Providing Compassionate, Quality Care - Together ? ?NEUROSURGERY HISTORY & PHYSICAL ? ? ?Stephanie Noble is an 66 y.o. female.   ?Chief Complaint: Neck pain ?HPI: This is a pleasant 66 year old female with a history of cervical fusion C4-7 years ago, that had complaints of worsening neck pain and headaches that failed conservative measures.  MRI revealed severe stenosis with disc degeneration at C3-4.  She presents today for C3-4 ACDF, exploration of fusion at C4-5 with removal of hardware. ? ?Past Medical History:  ?Diagnosis Date  ? Anxiety   ? Asthma   ? uses albuterol as needed "mostly weather induced".ALbuterol and Ventolin inhaler as needed  ? Chronic back pain   ? Chronic neck pain   ? Complication of anesthesia   ? post-op PEA arrest 04/16/16, thought due to respriatory arrest from sedation, narcotic, hypovolemia  ? Degenerative disc disease   ? cervical and lumbar area  ? Depression   ? History of bronchitis 2016  ? History of cervical spinal surgery 12/30/2019  ? History of lumbar surgery 12/30/2019  ? Insomnia   ? takes Ambien nightly  ? Muscle spasm   ? takes Robaxin daily   ? Pneumonia   ? hx of-2016  ? PONV (postoperative nausea and vomiting)   ? Weakness   ? numbness and tingling in legs  ? ? ?Past Surgical History:  ?Procedure Laterality Date  ? BACK SURGERY    ? x 3 lumbar  ? CHOLECYSTECTOMY    ? DILATION AND CURETTAGE OF UTERUS    ? x 2  ? JOINT REPLACEMENT    ? KNEE SURGERY Left   ? x 2  ? left knee arthroscopy     ? x 2  ? POSTERIOR CERVICAL FUSION/FORAMINOTOMY  05/17/2012  ? Procedure: POSTERIOR CERVICAL FUSION/FORAMINOTOMY LEVEL 2;  Surgeon: Hosie Spangle, MD;  Location: [facility];  Service: Neurosurgery;  Laterality: N/A;  cervical six to Thoracic One posterior cervical arthrodesis with instrumentation  ? SHOULDER ARTHROSCOPY Left 10/27/2019  ? Procedure: left shoulder arthroscopy with arthroscopic vs open distal clavical excision;  Surgeon: Meredith Pel, MD;  Location: [facility];  Service: Orthopedics;  Laterality: Left;  ? SHOULDER SURGERY    ? right  ? SPINE SURGERY    ? x 5  ? TOE FUSION Left 09/2016  ? TOTAL HIP ARTHROPLASTY Left 11/09/2018  ? Procedure: LEFT TOTAL HIP ARTHROPLASTY DIRECT ANTERIOR APPROACH;  Surgeon: Meredith Pel, MD;  Location: [facility];  Service: Orthopedics;  Laterality: Left;  ? ? ?Family History  ?Problem Relation Age of Onset  ? Dementia Mother   ? Other Sister   ? Breast cancer Sister   ? Alcohol abuse Other   ? Breast cancer Paternal Aunt   ? ?Social History:  reports that she has never smoked. She has never used smokeless tobacco. She reports that she does not drink alcohol and does not use drugs. ? ?Allergies:  ?Allergies  ?Allergen Reactions  ? Vistaril [Hydroxyzine Hcl] Other (See Comments)  ?  Pt went into cardiac arrest   ? ? ?Medications Prior to Admission  ?Medication Sig Dispense Refill  ? albuterol (PROVENTIL) (2.5 MG/3ML) 0.083% nebulizer solution Take 3 mLs (2.5 mg total) by nebulization every 4 (four) hours as needed for wheezing or shortness of breath. 150 mL 5  ? albuterol (VENTOLIN HFA) 108 (90 Base) MCG/ACT inhaler Inhale 2 sprays QID prn wheezing (Patient taking differently: Inhale 2 puffs into the lungs 4 (four) times  daily as needed for wheezing or shortness of breath.) 18 g 0  ? DULoxetine (CYMBALTA) 20 MG capsule Take 2 capsules (40 mg total) by mouth daily. (Patient taking differently: Take 20 mg by mouth daily.) 180 capsule 1  ? gabapentin (NEURONTIN) 300 MG capsule TAKE 2 CAPSULE IN THE MORNING , 2 AT LUNCH AND 3 AT BEDTIME (Patient taking differently: Take 300-900 mg by mouth See admin instructions. 300 mg in the morning, 600 mg at lunch, 900 mg at bedtime) 180 capsule 2  ? methocarbamol (ROBAXIN) 750 MG tablet TAKE 1 TABLET BY MOUTH THREE TIMES A DAY AS NEEDED (Patient taking differently: Take 750 mg by mouth 3 (three) times daily.) 90 tablet 2  ? Omega-3 Fatty Acids (FISH OIL) 1200 MG CAPS Take 1,200 mg by mouth at bedtime.     ? Propylene Glycol (SYSTANE BALANCE) 0.6 % SOLN Place 1 drop into both eyes 2 (two) times daily as needed (dry eyes).    ? zolpidem (AMBIEN) 10 MG tablet TAKE 1 TABLET BY MOUTH EVERYDAY AT BEDTIME 30 tablet 1  ? meloxicam (MOBIC) 7.5 MG tablet Take 1 tablet (7.5 mg total) by mouth daily. 30 tablet 1  ? ? ?No results found for this or any previous visit (from the past 48 hour(s)). ?No results found. ? ?ROS ?All pertinent positives and negatives are listed in HPI above ? ?Blood pressure 123/82, pulse 65, temperature 98.1 ?F (36.7 ?C), temperature source Oral, resp. rate 17, height 5\' 5"  (1.651 m), weight 65.8 kg, SpO2 100 %. ?Physical Exam  ?Awake alert oriented x3, no acute distress ?PERRLA ?Cranial nerves II through XII intact ?Speech fluent and appropriate ?Bilateral upper extremity 4+/5 throughout ?Bilateral lower extremity 5/5 throughout ?Sensory intact light touch ?Deep tendon reflexes 3/4 throughout bilateral upper/lower extremities ?Positive Hoffmann's on the left ? ?Assessment/Plan ?66 year old female with ? ?Cervical stenosis C3-4 with neck pain ? ?-OR today for C3-4 ACDF, exploration of fusion at C4-5 with removal of hardware.  We discussed all risks, benefits expected outcomes as well as alternatives to treatment.  Informed consent was obtained.  The patient would like to proceed with surgical intervention. ? ?Thank you for allowing me to participate in this patient's care.  Please do not hesitate to call with questions or concerns. ? ? ?Kadence Mimbs, DO ?Neurosurgeon ?[facility] ?Cell: [phone] ? ? ?

## 2021-09-06 ENCOUNTER — Other Ambulatory Visit: Payer: Self-pay

## 2021-09-06 ENCOUNTER — Encounter (HOSPITAL_COMMUNITY): Payer: Self-pay | Admitting: Neurological Surgery

## 2021-09-06 MED ORDER — HYDROCODONE-ACETAMINOPHEN 5-325 MG PO TABS: 1.0000 | ORAL_TABLET | ORAL | 0 refills | Status: DC | PRN

## 2021-09-06 MED ORDER — METHOCARBAMOL 500 MG PO TABS: 750.0000 mg | ORAL_TABLET | Freq: Four times a day (QID) | ORAL | 2 refills | Status: DC

## 2021-09-06 NOTE — Evaluation (Signed)
Physical Therapy Evaluation ?Patient Details ?Name: Stephanie Noble ?MRN: [id number] ?DOB: [birth year] ?Today's Date: 09/06/2021 ? ?History of Present Illness ? pt is a 66 y/o female s/p C3-4 ACDF 3/9, removal of hardware C4-5.  PMHx:DDD, cervical spinal surgery, lumbar surgeries x3, L TKA, L and R shd sx's, L THA.  ?Clinical Impression ? Pt is at or close to baseline functioning and should be safe at home with PRN assist from husband. There are no further acute PT needs.  Will sign off at this time. ?   ?   ? ?Recommendations for follow up therapy are one component of a multi-disciplinary discharge planning process, led by the attending physician.  Recommendations may be updated based on patient status, additional functional criteria and insurance authorization. ? ?Follow Up Recommendations No PT follow up ? ?  ?Assistance Recommended at Discharge PRN  ?Patient can return home with the following ?   ? ?  ?Equipment Recommendations None recommended by PT  ?Recommendations for Other Services ?    ?  ?Functional Status Assessment Patient has had a recent decline in their functional status and demonstrates the ability to make significant improvements in function in a reasonable and predictable amount of time.  ? ?  ?Precautions / Restrictions Precautions ?Precautions: Cervical ?Precaution Booklet Issued: No  ? ?  ? ?Mobility ? Bed Mobility ?Overal bed mobility: Independent ?  ?  ?  ?  ?  ?  ?General bed mobility comments: safe technique ?  ? ?Transfers ?Overall transfer level: Modified independent ?  ?  ?  ?  ?  ?  ?  ?  ?General transfer comment: safe technique ?  ? ?Ambulation/Gait ?Ambulation/Gait assistance: Independent ?Gait Distance (Feet): 200 Feet ?Assistive device: None ?Gait Pattern/deviations: WFL(Within Functional Limits) ?  ?Gait velocity interpretation: >4.37 ft/sec, indicative of normal walking speed ?  ?General Gait Details: age appropriate speed and very stable ? ?Stairs ?Stairs: Yes ?Stairs assistance:  Modified independent (Device/Increase time) ?Stair Management: One rail Right, No rails, Alternating pattern, Forwards ?Number of Stairs: 10 ?General stair comments: safe with rail ? ?Wheelchair Mobility ?  ? ?Modified Rankin (Stroke Patients Only) ?  ? ?  ? ?Balance Overall balance assessment: Modified Independent ?  ?  ?  ?  ?  ?  ?  ?  ?  ?  ?  ?  ?  ?  ?  ?  ?  ?  ?   ? ? ? ?Pertinent Vitals/Pain Pain Assessment ?Pain Assessment: Faces ?Faces Pain Scale: Hurts even more ?Pain Location: neck ?Pain Descriptors / Indicators: Aching, Guarding ?Pain Intervention(s): Monitored during session  ? ? ?Home Living Family/patient expects to be discharged to:: Private residence ?Living Arrangements: Spouse/significant other ?Available Help at Discharge: Family;Available 24 hours/day ?Type of Home: House ?Home Access: Stairs to enter ?Entrance Stairs-Rails: Right;Left ?  ?  ?Home Layout: Two level;Able to live on main level with bedroom/bathroom ?Home Equipment: Conservation officer, nature (2 wheels);Cane - single point;Shower seat;BSC/3in1 ?   ?  ?Prior Function Prior Level of Function : Independent/Modified Independent;Driving ?  ?  ?  ?  ?  ?  ?  ?  ?  ? ? ?Hand Dominance  ?   ? ?  ?Extremity/Trunk Assessment  ? Upper Extremity Assessment ?Upper Extremity Assessment: Overall WFL for tasks assessed ?  ? ?Lower Extremity Assessment ?Lower Extremity Assessment: Overall WFL for tasks assessed ?  ? ?Cervical / Trunk Assessment ?Cervical / Trunk Assessment: Neck Surgery  ?Communication  ?  Communication: No difficulties  ?Cognition Arousal/Alertness: Awake/alert ?Behavior During Therapy: [facility] for tasks assessed/performed ?Overall Cognitive Status: Within Functional Limits for tasks assessed ?  ?  ?  ?  ?  ?  ?  ?  ?  ?  ?  ?  ?  ?  ?  ?  ?  ?  ?  ? ?  ?General Comments General comments (skin integrity, edema, etc.): Reinforced to pt/husband all cervical education with emphasis on things that she enjoyed that she shouldn't do at this point  until cleared with MD. ? ?  ?Exercises    ? ?Assessment/Plan  ?  ?PT Assessment Patient does not need any further PT services  ?PT Problem List   ? ?   ?  ?PT Treatment Interventions     ? ?PT Goals (Current goals can be found in the Care Plan section)  ?Acute Rehab PT Goals ?PT Goal Formulation: All assessment and education complete, DC therapy ? ?  ?Frequency   ?  ? ? ?Co-evaluation   ?  ?  ?  ?  ? ? ?  ?AM-PAC PT "6 Clicks" Mobility  ?Outcome Measure Help needed turning from your back to your side while in a flat bed without using bedrails?: None ?Help needed moving from lying on your back to sitting on the side of a flat bed without using bedrails?: None ?Help needed moving to and from a bed to a chair (including a wheelchair)?: None ?Help needed standing up from a chair using your arms (e.g., wheelchair or bedside chair)?: None ?Help needed to walk in hospital room?: None ?Help needed climbing 3-5 steps with a railing? : None ?6 Click Score: 24 ? ?  ?End of Session   ?Activity Tolerance: Patient tolerated treatment well ?Patient left: in chair;with call bell/phone within reach;with family/visitor present ?Nurse Communication: Mobility status ?PT Visit Diagnosis: Other abnormalities of gait and mobility (R26.89);Pain ?  ? ?Time: [id number] ?PT Time Calculation (min) (ACUTE ONLY): 35 min ? ? ?Charges:   PT Evaluation ?$PT Eval Low Complexity: 1 Low ?PT Treatments ?$Gait Training: 8-22 mins ?  ?   ? ? ?09/06/2021 ? ?Ginger Carne., PT ?Acute Rehabilitation Services ?[phone]  (pager) ?[phone]  (office) ? ?Tessie Fass Tiajah Oyster ?09/06/2021, 11:10 AM ? ?

## 2021-09-06 NOTE — Op Note (Signed)
? ?Providing Compassionate, Quality Care - Together ? ?Date of service: 09/05/2021 ? ?PREOP DIAGNOSIS: Cervical spondylosis with radiculopathy, C3-4 with moderate to severe canal stenosis, left greater than right severe foraminal stenosis ? ?POSTOP DIAGNOSIS: Same ? ?PROCEDURE: ?1. Arthrodesis C3-4, anterior interbody technique  ?2. Placement of intervertebral biomechanical device C3-4, Medtronic Titan titanium interbody, 7 mm x 16 x 14 mm ?3. Placement of anterior instrumentation consisting of interbody plate and screws -Medtronic 23 mm zevo plate, 15 mm x 3.5 mm screws at C3, 15 mm x 4.0 mm screws at C4 ?4. Discectomy at C3-4 for decompression of spinal cord and exiting nerve roots  ?5.  Exploration of fusion at C4-5, with revision/removal of hardware ?6. Use of morselized bone allograft  ?7. Use of intraoperative microscope ? ?SURGEON: Dr. Kendell Bane Sarahelizabeth Conway, DO ? ?ASSISTANT: Docia Barrier, NP ? ?ANESTHESIA: General Endotracheal ? ?EBL: 20 cc ? ?SPECIMENS: None ? ?DRAINS: None ? ?COMPLICATIONS: None immediate ? ?CONDITION: Hemodynamically stable to PACU ? ?HISTORY: ?Stephanie Noble is a 66 y.o. y.o. female who initially presented to the outpatient clinic with signs and symptoms consistent with cervicalgia and cervical radiculopathy, left greater than right. MRI demonstrated moderate to severe canal stenosis at C3-4, severe left, moderate to severe right foraminal stenosis at this level.  She does have a history of a C4-7 ACDF many years ago which she tolerated well.  Her pain was resistant to conservative measures therefore I offered her treatment in the form of cervical decompression and fusion at C3-4 with revision/exploration of fusion at C4-5. Treatment options were discussed including continued pain control, steroid injections, physical therapy. After all questions were answered, informed consent was obtained. ? ?PROCEDURE IN DETAIL: ?The patient was brought to the operating room and transferred to the operative  table. After induction of general anesthesia, the patient was positioned on the operative table in the supine position with all pressure points meticulously padded. The skin of the neck was then prepped and draped in the usual sterile fashion.  Physician driven timeout was performed. ? ?After timeout was conducted, skin incision was then made sharply with a 10 blade and Bovie electrocautery was used to dissect the subcutaneous tissue until the platysma was identified. The platysma was then divided and undermined. The sternocleidomastoid muscle was then identified and, utilizing natural fascial planes in the neck, the prevertebral fascia was identified and the carotid sheath was retracted laterally and the trachea and esophagus retracted medially. Again using fluoroscopy, the correct disc space was identified. Bovie electrocautery was used to dissect in the subperiosteal plane and elevate the bilateral longus coli muscles. Self-retaining retractors were then placed under the longus coli muscles bilaterally. At this point, the microscope was draped and brought into the field, and the remainder of the case was done under the microscope using microdissecting technique. ? ?The previous anterior cervical plate was identified and the superior portion was exposed along C4-5.  Using a metal cutting bur, the superior portion was cut and removed without difficulty.  Screws were removed without difficulty.  These were measured to be 14 mm x 4.64mm in size.  Distraction pins were placed in midline above and below the disc space at C3-4.  The disc space was placed in distraction.  The disc space was incised sharply and rongeurs were use to initially complete a discectomy. The high-speed drill was then used to complete discectomy until the posterior annulus was identified and removed and the posterior longitudinal ligament was identified. Using microcurettes, the PLL was  elevated, and Kerrison rongeurs were used to remove the  posterior longitudinal ligament and the ventral thecal sac was identified. Using a combination of curettes and ronguers, complete decompression of the thecal sac and exiting nerve roots at this level was completed, and verified using micro-nerve hook. The disc space was taken out of distraction.  ? ?Having completed our decompression, attention was turned to placement of the intervertebral device. Trial spacers were used to select a 7 mm graft. This graft was then filled with morcellized allograft, and inserted under live fluoroscopy. ? ?After placement of the intervertebral device, the above anterior cervical plate was selected, and placed across the interspace. Using a high-speed drill, the cortex of the cervical vertebral bodies was punctured, and screws inserted in the level above and inserted into the previous screw tracks in C4.  There was appropriate bony purchase. Final fluoroscopic images in AP and lateral projections were taken to confirm good hardware placement.  The plate was final tightened to the manufacturer's recommendation and the screws were locked in place. ? ?At this point, after all counts were verified to be correct, meticulous hemostasis was secured using a combination of bipolar electrocautery and passive hemostatics.  Skin was closed with staples.  Sterile dressing was applied. ? ?The patient tolerated the procedure well and was extubated in the room and taken to the postanesthesia care unit in stable condition. ? ?

## 2021-09-06 NOTE — Progress Notes (Signed)
?  Transition of Care (TOC) Screening Note ? ? ?Patient Details  ?Name: Stephanie Noble ?Date of Birth: 1955/12/03 ? ? ?Transition of Care [facility]) CM/SW Contact:    ?Baldemar Lenis, LCSW ?Phone Number: ?09/06/2021, 10:02 AM ? ? ? ?Transition of Care Department [facility]) has reviewed patient and no TOC needs have been identified at this time. We will continue to monitor patient advancement through interdisciplinary progression rounds. If new patient transition needs arise, please place a TOC consult. ?  ?

## 2021-09-06 NOTE — Progress Notes (Signed)
Discharge instructions (including medications) discussed with and copy provided to patient/caregiver. All belongings sent with patient. 

## 2021-09-06 NOTE — Discharge Summary (Signed)
Physician Discharge Summary  ?Patient ID: ?Davene Costain ?MRN: [id number] ?DOB/AGE: Nov 11, 1955 66 y.o. ? ?Admit date: 09/05/2021 ?Discharge date: 09/06/2021 ? ?Admission Diagnoses: Cervical spondylosis with radiculopathy, C3-4 with moderate to severe canal stenosis, left greater than right severe foraminal stenosis ? ?Discharge Diagnoses: Cervical spondylosis with radiculopathy, C3-4 with moderate to severe canal stenosis, left greater than right severe foraminal stenosis ?Principal Problem: ?  Cervical spondylosis ? ? ?Discharged Condition: good ? ?Hospital Course: The patient was admitted on 09/05/2021 and taken to the operating room where the patient underwent C3-4 ACDF, exploration of fusion at C4-5 with removal of hardware. The patient tolerated the procedure well and was taken to the recovery room and then to the floor in stable condition. The hospital course was routine. There were no complications. The wound remained clean dry and intact. Pt had appropriate upper back/neck soreness. No complaints of arm pain or new N/T/W. The patient remained afebrile with stable vital signs, and tolerated a regular diet. The patient continued to increase activities, and pain was well controlled with oral pain medications. ? ? ?Consults: None ? ?Significant Diagnostic Studies: radiology: X-Ray: intraoperative ? ? ?Treatments: surgery:  ?1. Arthrodesis C3-4, anterior interbody technique  ?2. Placement of intervertebral biomechanical device C3-4, Medtronic Titan titanium interbody, 7 mm x 16 x 14 mm ?3. Placement of anterior instrumentation consisting of interbody plate and screws -Medtronic 23 mm zevo plate, 15 mm x 3.5 mm screws at C3, 15 mm x 4.0 mm screws at C4 ?4. Discectomy at C3-4 for decompression of spinal cord and exiting nerve roots  ?5.  Exploration of fusion at C4-5, with revision/removal of hardware ?6. Use of morselized bone allograft  ?7. Use of intraoperative microscope ? ?Discharge Exam: ?Blood pressure 113/69,  pulse 74, temperature 98 ?F (36.7 ?C), temperature source Oral, resp. rate (!) 21, height 5\' 5"  (1.651 m), weight 65.8 kg, SpO2 95 %. ?Physical Exam: Patient is awake, A/O X 4, conversant, and in good spirits. They are in NAD and VSS. Doing well. Speech is fluent and appropriate. MAEW with good strength that is symmetric bilaterally.  BUE 5/5 throughout, BLE 5/5 throughout. Sensation to light touch is intact. PERLA, EOMI. CNs grossly intact. Dressing is clean dry intact. Incision is well approximated with no drainage, erythema, or edema. Hard cervical collar in place ? ? ?Disposition: Discharge disposition: 01-Home or Self Care ? ? ? ? ? ? ?Discharge Instructions   ? ? Incentive spirometry RT   Complete by: As directed ?  ? ?  ? ?Allergies as of 09/06/2021   ? ?   Reactions  ? Vistaril [hydroxyzine Hcl] Other (See Comments)  ? Pt went into cardiac arrest   ? Hydromorphone Hcl Hives  ? Per report from PACU RN, pt had itching on arrival to pacu then developed hives after dilaudid given. Discussed with Dr. 11/06/2021 and dilaudid allergy being added to record   ? ?  ? ?  ?Medication List  ?  ? ?STOP taking these medications   ? ?meloxicam 7.5 MG tablet ?Commonly known as: MOBIC ?  ? ?  ? ?TAKE these medications   ? ?albuterol (2.5 MG/3ML) 0.083% nebulizer solution ?Commonly known as: PROVENTIL ?Take 3 mLs (2.5 mg total) by nebulization every 4 (four) hours as needed for wheezing or shortness of breath. ?What changed: Another medication with the same name was changed. Make sure you understand how and when to take each. ?  ?albuterol 108 (90 Base) MCG/ACT inhaler ?Commonly known as: VENTOLIN  HFA ?Inhale 2 sprays QID prn wheezing ?What changed:  ?how much to take ?how to take this ?when to take this ?reasons to take this ?additional instructions ?  ?DULoxetine 20 MG capsule ?Commonly known as: CYMBALTA ?Take 2 capsules (40 mg total) by mouth daily. ?What changed: how much to take ?  ?Fish Oil 1200 MG Caps ?Take 1,200 mg by  mouth at bedtime. ?  ?gabapentin 300 MG capsule ?Commonly known as: NEURONTIN ?TAKE 2 CAPSULE IN THE MORNING , 2 AT LUNCH AND 3 AT BEDTIME ?What changed:  ?how much to take ?how to take this ?when to take this ?additional instructions ?  ?HYDROcodone-acetaminophen 5-325 MG tablet ?Commonly known as: NORCO/VICODIN ?Take 1 tablet by mouth every 4 (four) hours as needed for moderate pain. ?  ?methocarbamol 750 MG tablet ?Commonly known as: ROBAXIN ?TAKE 1 TABLET BY MOUTH THREE TIMES A DAY AS NEEDED ?What changed: when to take this ?  ?methocarbamol 500 MG tablet ?Commonly known as: Robaxin ?Take 1.5 tablets (750 mg total) by mouth 4 (four) times daily. ?What changed: You were already taking a medication with the same name, and this prescription was added. Make sure you understand how and when to take each. ?  ?Systane Balance 0.6 % Soln ?Generic drug: Propylene Glycol ?Place 1 drop into both eyes 2 (two) times daily as needed (dry eyes). ?  ?zolpidem 10 MG tablet ?Commonly known as: AMBIEN ?TAKE 1 TABLET BY MOUTH EVERYDAY AT BEDTIME ?  ? ?  ? ? ? ?Signed: ?Council Mechanic, DNP, NP-C ?09/06/2021, 8:55 AM ? ? ?

## 2021-09-06 NOTE — Progress Notes (Signed)
OT Cancellation Note ? ?Patient Details ?Name: Stephanie Noble ?MRN: [id number] ?DOB: September 28, 1955 ? ? ?Cancelled Treatment:    Reason Eval/Treat Not Completed: OT screened, no needs identified, will sign off. PT evaluated pt and reported that she was independent with all transfers, mobility and BADL's. She has no further needs for acute OT at this time.  ? ?[name] Elane Bing Plume ?09/06/2021, 11:10 AM ?

## 2021-09-06 NOTE — Anesthesia Postprocedure Evaluation (Signed)
Anesthesia Post Note ? ?Patient: Stephanie Noble ? ?Procedure(s) Performed: CERVICAL THREE-FOUR ANTERIOR CERVICAL DECOMPRESSION/DISCECTOMY FUSION WITH PARTIAL PLATE REMOVAL AT CERVICAL FOUR-FIVE (Spine Cervical) ? ?  ? ?Patient location during evaluation: PACU ?Anesthesia Type: General ?Level of consciousness: awake and alert ?Pain management: pain level controlled ?Vital Signs Assessment: post-procedure vital signs reviewed and stable ?Respiratory status: spontaneous breathing, nonlabored ventilation, respiratory function stable and patient connected to nasal cannula oxygen ?Cardiovascular status: blood pressure returned to baseline and stable ?Postop Assessment: no apparent nausea or vomiting ?Anesthetic complications: no ? ? ?No notable events documented. ? ?Last Vitals:  ?Vitals:  ? 09/06/21 0753 09/06/21 1015  ?BP: 113/69 112/81  ?Pulse: 74 74  ?Resp: (!) 21 18  ?Temp: 36.7 ?C   ?SpO2: 95% 100%  ?  ?Last Pain:  ?Vitals:  ? 09/06/21 0855  ?TempSrc:   ?PainSc: 3   ? ? ?  ?  ?  ?  ?  ?  ? ?Clevland Cork ? ? ? ? ?

## 2021-09-09 ENCOUNTER — Telehealth: Payer: Self-pay

## 2021-09-09 NOTE — Telephone Encounter (Signed)
Transition Care Management Follow-up Telephone Call ?Date of discharge and from where: 09/06/21 [facility] ?How have you been since you were released from the hospital? Doing good  ?Any questions or concerns? No ? ?Items Reviewed: ?Did the pt receive and understand the discharge instructions provided? Yes  ?Medications obtained and verified? Yes  ?Other? No  ?Any new allergies since your discharge? No  ?Dietary orders reviewed? Yes ?Do you have support at home? Yes  ? ?Home Care and Equipment/Supplies: ?Were home health services ordered? no ?If so, what is the name of the agency? N/a  ?Has the agency set up a time to come to the patient's home? not applicable ?Were any new equipment or medical supplies ordered?  No ?What is the name of the medical supply agency? N/a ?Were you able to get the supplies/equipment? not applicable ?Do you have any questions related to the use of the equipment or supplies? No ? ?Functional Questionnaire: (I = Independent and D = Dependent) ?ADLs: I ? ?Bathing/Dressing- D ? ?Meal Prep- D ? ?Eating- I ? ?Maintaining continence- I ? ?Transferring/Ambulation- I ? ?Managing Meds- I ? ?Follow up appointments reviewed: ? ?PCP Hospital f/u appt confirmed? No  Patient declined appointment at this time. Unable to travel. ?Specialist Hospital f/u appt confirmed? Yes  ?Are transportation arrangements needed? No  ?If their condition worsens, is the pt aware to call PCP or go to the Emergency Dept.? Yes ?Was the patient provided with contact information for the PCP's office or ED? Yes ?Was to pt encouraged to call back with questions or concerns? Yes  ?

## 2021-09-16 ENCOUNTER — Other Ambulatory Visit: Payer: Self-pay | Admitting: Internal Medicine

## 2021-09-16 DIAGNOSIS — F5101 Primary insomnia: Secondary | ICD-10-CM

## 2021-09-18 ENCOUNTER — Telehealth: Payer: Self-pay

## 2021-09-18 NOTE — Telephone Encounter (Signed)
BV pending for Monovisc, left knee. ? ?

## 2021-09-19 ENCOUNTER — Inpatient Hospital Stay: Payer: Medicare Other | Admitting: Internal Medicine

## 2021-09-20 ENCOUNTER — Telehealth: Payer: Self-pay

## 2021-09-20 ENCOUNTER — Telehealth: Payer: Self-pay | Admitting: Orthopedic Surgery

## 2021-09-20 NOTE — Telephone Encounter (Signed)
Talked with patient and appointment is scheduled. ?

## 2021-09-20 NOTE — Telephone Encounter (Signed)
Patient called asked if the gel injection was approved. Patient said Dr August Saucer was ordering the injection 2 months ago. The number to contact patient is [phone] ?

## 2021-09-20 NOTE — Telephone Encounter (Signed)
Approved for Monovisc, left knee. ?[place] ?Medicare deductible has been met ?Secondary insurance will pick up remaining eligible expenses at 100%  ?No Co-pay ?No PA required ? ?Appt. 10/09/2021 with Dr. Marlou Sa ?

## 2021-10-09 ENCOUNTER — Encounter: Payer: Self-pay | Admitting: Orthopedic Surgery

## 2021-10-09 ENCOUNTER — Ambulatory Visit (INDEPENDENT_AMBULATORY_CARE_PROVIDER_SITE_OTHER): Payer: Medicare Other | Admitting: Orthopedic Surgery

## 2021-10-09 DIAGNOSIS — M1712 Unilateral primary osteoarthritis, left knee: Secondary | ICD-10-CM

## 2021-10-09 MED ORDER — HYALURONAN 88 MG/4ML IX SOSY
88.0000 mg | PREFILLED_SYRINGE | INTRA_ARTICULAR | Status: AC | PRN
  Administered 2021-10-09: 88 mg via INTRA_ARTICULAR

## 2021-10-09 MED ORDER — LIDOCAINE HCL 1 % IJ SOLN
5.0000 mL | INTRAMUSCULAR | Status: AC | PRN
  Administered 2021-10-09: 5 mL

## 2021-10-09 NOTE — Progress Notes (Signed)
? ?  Procedure Note ? ?Patient: Stephanie Noble             ?Date of Birth: 1956-02-05           ?MRN: [id number]             ?Visit Date: 10/09/2021 ? ?Procedures: ?Visit Diagnoses:  ?1. Unilateral primary osteoarthritis, left knee   ? ? ?Large Joint Inj: L knee on 10/09/2021 8:58 PM ?Indications: pain, joint swelling and diagnostic evaluation ?Details: 18 G 1.5 in needle, superolateral approach ? ?Arthrogram: No ? ?Medications: 5 mL lidocaine 1 %; 88 mg Hyaluronan 88 MG/4ML ?Outcome: tolerated well, no immediate complications ?Procedure, treatment alternatives, risks and benefits explained, specific risks discussed. Consent was given by the patient. Immediately prior to procedure a time out was called to verify the correct patient, procedure, equipment, support staff and site/side marked as required. Patient was prepped and draped in the usual sterile fashion.  ? ? ? ? ? ?

## 2021-10-16 ENCOUNTER — Other Ambulatory Visit: Payer: Self-pay | Admitting: Internal Medicine

## 2021-10-16 DIAGNOSIS — F5101 Primary insomnia: Secondary | ICD-10-CM

## 2021-10-18 ENCOUNTER — Telehealth: Payer: Self-pay

## 2021-10-18 NOTE — Telephone Encounter (Signed)
Appt scheduled

## 2021-10-18 NOTE — Telephone Encounter (Signed)
This was refused by Dr Allena Katz patient needs IN OFFICE appointment before refill ?

## 2021-10-18 NOTE — Telephone Encounter (Signed)
Patient need med refill ? ?zolpidem (AMBIEN) 10 MG tablet  ?

## 2021-10-21 ENCOUNTER — Encounter: Payer: Self-pay | Admitting: Internal Medicine

## 2021-10-21 ENCOUNTER — Telehealth: Payer: Self-pay

## 2021-10-21 ENCOUNTER — Ambulatory Visit (INDEPENDENT_AMBULATORY_CARE_PROVIDER_SITE_OTHER): Payer: Medicare Other | Admitting: Internal Medicine

## 2021-10-21 DIAGNOSIS — F5101 Primary insomnia: Secondary | ICD-10-CM

## 2021-10-21 DIAGNOSIS — M542 Cervicalgia: Secondary | ICD-10-CM

## 2021-10-21 DIAGNOSIS — M4722 Other spondylosis with radiculopathy, cervical region: Secondary | ICD-10-CM

## 2021-10-21 DIAGNOSIS — F4310 Post-traumatic stress disorder, unspecified: Secondary | ICD-10-CM | POA: Diagnosis not present

## 2021-10-21 DIAGNOSIS — G8929 Other chronic pain: Secondary | ICD-10-CM

## 2021-10-21 DIAGNOSIS — Z9889 Other specified postprocedural states: Secondary | ICD-10-CM

## 2021-10-21 MED ORDER — ZOLPIDEM TARTRATE 10 MG PO TABS: 10.0000 mg | ORAL_TABLET | Freq: Every evening | ORAL | 2 refills | Status: DC | PRN

## 2021-10-21 MED ORDER — DULOXETINE HCL 20 MG PO CPEP: 20.0000 mg | ORAL_CAPSULE | Freq: Every day | ORAL | 1 refills | Status: DC

## 2021-10-21 MED ORDER — GABAPENTIN 300 MG PO CAPS: ORAL_CAPSULE | ORAL | 2 refills | Status: DC

## 2021-10-21 NOTE — Assessment & Plan Note (Signed)
S/p C3-C4 decompression with fusion surgery ?On gabapentin, Cymbalta and Robaxin ?Followed by Spine Surgery ?

## 2021-10-21 NOTE — Assessment & Plan Note (Addendum)
Well-controlled with Ambien PDMP reviewed, refilled Ambien 

## 2021-10-21 NOTE — Progress Notes (Signed)
?  ? ?Virtual Visit via Telephone Note  ? ?This visit type was conducted due to national recommendations for restrictions regarding the COVID-19 Pandemic (e.g. social distancing) in an effort to limit this patient's exposure and mitigate transmission in our community.  Due to her co-morbid illnesses, this patient is at least at moderate risk for complications without adequate follow up.  This format is felt to be most appropriate for this patient at this time.  The patient did not have access to video technology/had technical difficulties with video requiring transitioning to audio format only (telephone).  All issues noted in this document were discussed and addressed.  No physical exam could be performed with this format. ? ?Evaluation Performed:  Follow-up visit ? ?Date:  10/21/2021  ? ?ID:  Stephanie Noble, DOB [birth year], MRN [id number] ? ?Patient Location: Home ?Provider Location: Office/Clinic ? ?Participants: Patient ?Location of Patient: Home ?Location of Provider: Telehealth ?Consent was obtain for visit to be over via telehealth. ?I verified that I am speaking with the correct person using two identifiers. ? ?PCP:  Lindell Spar, MD  ? ?Chief Complaint: Insomnia and neck pain ? ?History of Present Illness:   ? ?Stephanie Noble is a 66 y.o. female with PMH of  cervical spinal stenosis s/p fusion surgery, lumbar disc herniation s/p surgery, history of cardiac arrest related to operative analgesia and asthma who has a televisit for follow-up of chronic neck pain and insomnia. ? ?She had C3-4 discectomy with fusion in 03/23, and has been recovering well from it.  She has been taking Cymbalta, gabapentin and Robaxin for chronic neck pain. ? ?She has history of PTSD and insomnia.  She has been taking Cymbalta for neck pain and PTSD.  She takes Ambien at nighttime for insomnia.  She currently denies any anhedonia, SI or HI. ? ?The patient does not have symptoms concerning for COVID-19 infection (fever,  chills, cough, or new shortness of breath).  ? ?Past Medical, Surgical, Social History, Allergies, and Medications have been Reviewed. ? ?Past Medical History:  ?Diagnosis Date  ? Anxiety   ? Asthma   ? uses albuterol as needed "mostly weather induced".ALbuterol and Ventolin inhaler as needed  ? Chronic back pain   ? Chronic neck pain   ? Complication of anesthesia   ? post-op PEA arrest 04/16/16, thought due to respriatory arrest from sedation, narcotic, hypovolemia  ? Degenerative disc disease   ? cervical and lumbar area  ? Depression   ? History of bronchitis 2016  ? History of cervical spinal surgery 12/30/2019  ? History of lumbar surgery 12/30/2019  ? Insomnia   ? takes Ambien nightly  ? Muscle spasm   ? takes Robaxin daily   ? Pneumonia   ? hx of-2016  ? PONV (postoperative nausea and vomiting)   ? Weakness   ? numbness and tingling in legs  ? ?Past Surgical History:  ?Procedure Laterality Date  ? ANTERIOR CERVICAL DECOMP/DISCECTOMY FUSION N/A 09/05/2021  ? Procedure: CERVICAL THREE-FOUR ANTERIOR CERVICAL DECOMPRESSION/DISCECTOMY FUSION WITH PARTIAL PLATE REMOVAL AT CERVICAL FOUR-FIVE;  Surgeon: Dawley, Theodoro Doing, DO;  Location: [facility];  Service: Neurosurgery;  Laterality: N/A;  3C  ? BACK SURGERY    ? x 3 lumbar  ? CHOLECYSTECTOMY    ? DILATION AND CURETTAGE OF UTERUS    ? x 2  ? JOINT REPLACEMENT    ? KNEE SURGERY Left   ? x 2  ? left knee arthroscopy     ? x  2  ? POSTERIOR CERVICAL FUSION/FORAMINOTOMY  05/17/2012  ? Procedure: POSTERIOR CERVICAL FUSION/FORAMINOTOMY LEVEL 2;  Surgeon: Hosie Spangle, MD;  Location: [facility];  Service: Neurosurgery;  Laterality: N/A;  cervical six to Thoracic One posterior cervical arthrodesis with instrumentation  ? SHOULDER ARTHROSCOPY Left 10/27/2019  ? Procedure: left shoulder arthroscopy with arthroscopic vs open distal clavical excision;  Surgeon: Meredith Pel, MD;  Location: [facility];  Service: Orthopedics;  Laterality: Left;  ? SHOULDER SURGERY    ? right  ? SPINE  SURGERY    ? x 5  ? TOE FUSION Left 09/2016  ? TOTAL HIP ARTHROPLASTY Left 11/09/2018  ? Procedure: LEFT TOTAL HIP ARTHROPLASTY DIRECT ANTERIOR APPROACH;  Surgeon: Meredith Pel, MD;  Location: [facility];  Service: Orthopedics;  Laterality: Left;  ?  ? ?Current Meds  ?Medication Sig  ? albuterol (PROVENTIL) (2.5 MG/3ML) 0.083% nebulizer solution Take 3 mLs (2.5 mg total) by nebulization every 4 (four) hours as needed for wheezing or shortness of breath.  ? albuterol (VENTOLIN HFA) 108 (90 Base) MCG/ACT inhaler Inhale 2 sprays QID prn wheezing (Patient taking differently: Inhale 2 puffs into the lungs 4 (four) times daily as needed for wheezing or shortness of breath.)  ? HYDROcodone-acetaminophen (NORCO/VICODIN) 5-325 MG tablet Take 1 tablet by mouth every 4 (four) hours as needed for moderate pain.  ? methocarbamol (ROBAXIN) 500 MG tablet Take 1.5 tablets (750 mg total) by mouth 4 (four) times daily.  ? methocarbamol (ROBAXIN) 750 MG tablet TAKE 1 TABLET BY MOUTH THREE TIMES A DAY AS NEEDED (Patient taking differently: Take 750 mg by mouth 3 (three) times daily.)  ? Omega-3 Fatty Acids (FISH OIL) 1200 MG CAPS Take 1,200 mg by mouth at bedtime.  ? Propylene Glycol (SYSTANE BALANCE) 0.6 % SOLN Place 1 drop into both eyes 2 (two) times daily as needed (dry eyes).  ? [DISCONTINUED] DULoxetine (CYMBALTA) 20 MG capsule Take 2 capsules (40 mg total) by mouth daily. (Patient taking differently: Take 20 mg by mouth daily.)  ? [DISCONTINUED] gabapentin (NEURONTIN) 300 MG capsule TAKE 2 CAPSULE IN THE MORNING , 2 AT LUNCH AND 3 AT BEDTIME (Patient taking differently: Take 300-900 mg by mouth See admin instructions. 300 mg in the morning, 600 mg at lunch, 900 mg at bedtime)  ? [DISCONTINUED] zolpidem (AMBIEN) 10 MG tablet TAKE 1 TABLET BY MOUTH AT BEDTIME  ?  ? ?Allergies:   Vistaril [hydroxyzine hcl], Hydromorphone hcl, and Dilaudid [hydromorphone]  ? ?ROS:   ?Please see the history of present illness.    ? ?All other  systems reviewed and are negative. ? ? ?Labs/Other Tests and Data Reviewed:   ? ?Recent Labs: ?09/03/2021: Hemoglobin 13.6; Platelets 232  ? ?Recent Lipid Panel ?No results found for: CHOL, TRIG, HDL, CHOLHDL, LDLCALC, LDLDIRECT ? ?Wt Readings from Last 3 Encounters:  ?09/05/21 145 lb (65.8 kg)  ?09/03/21 152 lb 6.4 oz (69.1 kg)  ?08/01/20 151 lb 1.9 oz (68.5 kg)  ?  ? ?ASSESSMENT & PLAN:   ? ?Insomnia ?Well-controlled with Ambien ?PDMP reviewed, refilled Ambien ? ?PTSD (post-traumatic stress disorder) ?Stable ?Has insomnia, takes Ambien ? ?Cervical spondylosis with radiculopathy ?S/p C3-C4 decompression with fusion surgery ?On gabapentin, Cymbalta and Robaxin ?Followed by Spine Surgery ? ? ? ?Time:   ?Today, I have spent 15 minutes reviewing the chart, including problem list, medications, and with the patient with telehealth technology discussing the above problems. ? ? ?Medication Adjustments/Labs and Tests Ordered: ?Current medicines are reviewed at  length with the patient today.  Concerns regarding medicines are outlined above.  ? ?Tests Ordered: ?No orders of the defined types were placed in this encounter. ? ? ?Medication Changes: ?Meds ordered this encounter  ?Medications  ? zolpidem (AMBIEN) 10 MG tablet  ?  Sig: Take 1 tablet (10 mg total) by mouth at bedtime as needed for sleep.  ?  Dispense:  30 tablet  ?  Refill:  2  ? DULoxetine (CYMBALTA) 20 MG capsule  ?  Sig: Take 1 capsule (20 mg total) by mouth daily.  ?  Dispense:  90 capsule  ?  Refill:  1  ?  Please cancel the prescription of 40 mg.  ? gabapentin (NEURONTIN) 300 MG capsule  ?  Sig: TAKE 2 CAPSULE IN THE MORNING , 2 AT LUNCH AND 3 AT BEDTIME.  ?  Dispense:  210 capsule  ?  Refill:  2  ? ? ? ?Note: This dictation was prepared with Dragon dictation along with smaller phrase technology. Similar sounding words can be transcribed inadequately or may not be corrected upon review. Any transcriptional errors that result from this process are unintentional.   ?  ? ? ?Disposition:  Follow up  ?Signed, ?Lindell Spar, MD  ?10/21/2021 12:06 PM    ? ?[facility] ?[facility] ?

## 2021-10-21 NOTE — Telephone Encounter (Signed)
Called patient per nurse needs an in office visit, called and spoke to spouse, patient was in the bed and spouse said was just in office on 02.02.2023, its only ambein and does not see why an in office visit is needed.    ?

## 2021-10-21 NOTE — Patient Instructions (Signed)
Please continue taking medications as prescribed.  Please continue to follow low salt diet and ambulate as tolerated. 

## 2021-10-21 NOTE — Assessment & Plan Note (Signed)
Stable Has insomnia, takes Ambien 

## 2021-10-26 ENCOUNTER — Other Ambulatory Visit: Payer: Self-pay | Admitting: Internal Medicine

## 2021-10-26 ENCOUNTER — Encounter: Payer: Self-pay | Admitting: Internal Medicine

## 2021-10-26 DIAGNOSIS — U071 COVID-19: Secondary | ICD-10-CM

## 2021-10-26 MED ORDER — NIRMATRELVIR/RITONAVIR (PAXLOVID)TABLET: 3.0000 | ORAL_TABLET | Freq: Two times a day (BID) | ORAL | 0 refills | Status: AC

## 2021-11-03 ENCOUNTER — Other Ambulatory Visit: Payer: Self-pay | Admitting: Internal Medicine

## 2021-11-03 DIAGNOSIS — M4800 Spinal stenosis, site unspecified: Secondary | ICD-10-CM

## 2021-11-03 DIAGNOSIS — G8929 Other chronic pain: Secondary | ICD-10-CM

## 2021-11-04 ENCOUNTER — Other Ambulatory Visit: Payer: Self-pay | Admitting: Surgical

## 2021-11-04 DIAGNOSIS — M180 Bilateral primary osteoarthritis of first carpometacarpal joints: Secondary | ICD-10-CM

## 2021-12-21 ENCOUNTER — Other Ambulatory Visit: Payer: Self-pay | Admitting: Internal Medicine

## 2021-12-21 DIAGNOSIS — M4722 Other spondylosis with radiculopathy, cervical region: Secondary | ICD-10-CM

## 2021-12-21 DIAGNOSIS — Z9889 Other specified postprocedural states: Secondary | ICD-10-CM

## 2022-01-02 ENCOUNTER — Other Ambulatory Visit: Payer: Self-pay | Admitting: Surgical

## 2022-01-02 DIAGNOSIS — M180 Bilateral primary osteoarthritis of first carpometacarpal joints: Secondary | ICD-10-CM

## 2022-01-18 ENCOUNTER — Other Ambulatory Visit: Payer: Self-pay | Admitting: Internal Medicine

## 2022-01-18 DIAGNOSIS — M4800 Spinal stenosis, site unspecified: Secondary | ICD-10-CM

## 2022-01-18 DIAGNOSIS — G8929 Other chronic pain: Secondary | ICD-10-CM

## 2022-02-11 ENCOUNTER — Ambulatory Visit: Payer: Medicare Other | Admitting: Internal Medicine

## 2022-02-12 ENCOUNTER — Ambulatory Visit (INDEPENDENT_AMBULATORY_CARE_PROVIDER_SITE_OTHER): Payer: Medicare Other | Admitting: Internal Medicine

## 2022-02-12 ENCOUNTER — Encounter: Payer: Self-pay | Admitting: Internal Medicine

## 2022-02-12 VITALS — BP 124/82 | HR 67 | Resp 18 | Ht 65.0 in | Wt 146.8 lb

## 2022-02-12 DIAGNOSIS — M170 Bilateral primary osteoarthritis of knee: Secondary | ICD-10-CM | POA: Insufficient documentation

## 2022-02-12 DIAGNOSIS — K120 Recurrent oral aphthae: Secondary | ICD-10-CM

## 2022-02-12 DIAGNOSIS — M1610 Unilateral primary osteoarthritis, unspecified hip: Secondary | ICD-10-CM

## 2022-02-12 DIAGNOSIS — M5126 Other intervertebral disc displacement, lumbar region: Secondary | ICD-10-CM | POA: Diagnosis not present

## 2022-02-12 DIAGNOSIS — F5104 Psychophysiologic insomnia: Secondary | ICD-10-CM

## 2022-02-12 MED ORDER — FOLIC ACID 1 MG PO TABS: 1.0000 mg | ORAL_TABLET | Freq: Every day | ORAL | 3 refills | Status: DC

## 2022-02-12 MED ORDER — LIDOCAINE VISCOUS HCL 2 % MT SOLN: 5.0000 mL | Freq: Three times a day (TID) | OROMUCOSAL | 0 refills | Status: DC | PRN

## 2022-02-12 MED ORDER — HYDROCODONE-ACETAMINOPHEN 5-325 MG PO TABS: 1.0000 | ORAL_TABLET | Freq: Four times a day (QID) | ORAL | 0 refills | Status: DC | PRN

## 2022-02-12 NOTE — Patient Instructions (Signed)
Apply orajel for oral ulcer. Please use magic mouthwash for mouth pain.  Please take Folic acid 1 mg as prescribed.  Please continue taking other medications as prescribed.

## 2022-02-12 NOTE — Assessment & Plan Note (Signed)
Tylenol as needed for hip pain Still has episodes of severe hip and knee pain, for supply of Norco as needed for severe pain only Followed by orthopedic surgeon

## 2022-02-12 NOTE — Assessment & Plan Note (Signed)
Recurrent Advised to use Orajel as needed Magic mouthwash as needed for mouth pain Added folic acid 1 mg daily

## 2022-02-12 NOTE — Progress Notes (Signed)
Established Patient Office Visit  Subjective:  Patient ID: Stephanie Noble, female    DOB: 09-Jun-1956  Age: 66 y.o. MRN: [id number]  CC:  Chief Complaint  Patient presents with   Acute Visit    Patient has mouth sores this has been going on for awhile the lymph nodes are swollen looked bad 02-11-22    HPI Stephanie Noble is a 66 y.o. 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  She complains of chronic mild pain and ulcers.  Her husband had noticed swollen lymph nodes in her neck area, which are resolved today.  She denies any sore throat, but has pain while chewing due to mouth ulcers.  She was concerned about herpes, I have explained that it does not seem like herpes currently.  She denies any fever, chills, nausea or vomiting currently.  Patient has a longstanding history of insomnia, for which she has been taking Ambien.  She denies any anhedonia, recent weight or appetite changes or suicidal ideation.  Patient has been having chronic neck pain, which is constant, variable in intensity, worse with movement and is associated with numbness and weakness in bilateral upper extremities.  Patient has had fusion surgery in 2013 and 2014. Patient also complains of lower back pain, which refers to the buttocks area.  Patient takes Robaxin for muscle spasms.  Patient takes gabapentin and Cymbalta for neuropathy pain.  Patient used to see pain specialist in the past.  She also complains of chronic knee pain and swelling, for which she takes Mobic as needed, with some relief. She sees Dr August Saucer for OA of knee.     Past Medical History:  Diagnosis Date   Anxiety    Asthma    uses albuterol as needed "mostly weather induced".ALbuterol and Ventolin inhaler as needed   Chronic back pain    Chronic neck  pain    Complication of anesthesia    post-op PEA arrest 04/16/16, thought due to respriatory arrest from sedation, narcotic, hypovolemia   Degenerative disc disease    cervical and lumbar area   Depression    History of bronchitis 2016   History of cervical spinal surgery 12/30/2019   History of lumbar surgery 12/30/2019   Insomnia    takes Ambien nightly   Muscle spasm    takes Robaxin daily    Pneumonia    hx of-2016   PONV (postoperative nausea and vomiting)    Weakness    numbness and tingling in legs    Past Surgical History:  Procedure Laterality Date   ANTERIOR CERVICAL DECOMP/DISCECTOMY FUSION N/A 09/05/2021   Procedure: CERVICAL THREE-FOUR ANTERIOR CERVICAL DECOMPRESSION/DISCECTOMY FUSION WITH PARTIAL PLATE REMOVAL AT CERVICAL FOUR-FIVE;  Surgeon: Bethann Goo, DO;  Location: [facility] OR;  Service: Neurosurgery;  Laterality: N/A;  3C   BACK SURGERY     x 3 lumbar   CHOLECYSTECTOMY     DILATION AND CURETTAGE OF UTERUS     x 2   JOINT REPLACEMENT     KNEE SURGERY Left    x 2   left knee arthroscopy      x 2   POSTERIOR CERVICAL FUSION/FORAMINOTOMY  05/17/2012   Procedure: POSTERIOR CERVICAL FUSION/FORAMINOTOMY LEVEL 2;  Surgeon: Hewitt Shorts, MD;  Location: [facility];  Service: Neurosurgery;  Laterality: N/A;  cervical  six to Thoracic One posterior cervical arthrodesis with instrumentation   SHOULDER ARTHROSCOPY Left 10/27/2019   Procedure: left shoulder arthroscopy with arthroscopic vs open distal clavical excision;  Surgeon: Cammy Copa, MD;  Location: [facility] OR;  Service: Orthopedics;  Laterality: Left;   SHOULDER SURGERY     right   SPINE SURGERY     x 5   TOE FUSION Left 09/2016   TOTAL HIP ARTHROPLASTY Left 11/09/2018   Procedure: LEFT TOTAL HIP ARTHROPLASTY DIRECT ANTERIOR APPROACH;  Surgeon: Cammy Copa, MD;  Location: [facility] ORS;  Service: Orthopedics;  Laterality: Left;    Family History  Problem Relation Age of Onset   Dementia Mother     Other Sister    Breast cancer Sister    Alcohol abuse Other    Breast cancer Paternal Aunt     Social History   Socioeconomic History   Marital status: Married    Spouse name: Not on file   Number of children: Not on file   Years of education: Not on file   Highest education level: Not on file  Occupational History   Not on file  Tobacco Use   Smoking status: Never   Smokeless tobacco: Never  Vaping Use   Vaping Use: Never used  Substance and Sexual Activity   Alcohol use: No   Drug use: No   Sexual activity: Yes    Birth control/protection: Post-menopausal  Other Topics Concern   Not on file  Social History Narrative   Not on file   Social Determinants of Health   Financial Resource Strain: Not on file  Food Insecurity: Not on file  Transportation Needs: Not on file  Physical Activity: Not on file  Stress: Not on file  Social Connections: Not on file  Intimate Partner Violence: Not on file    Outpatient Medications Prior to Visit  Medication Sig Dispense Refill   albuterol (PROVENTIL) (2.5 MG/3ML) 0.083% nebulizer solution Take 3 mLs (2.5 mg total) by nebulization every 4 (four) hours as needed for wheezing or shortness of breath. 150 mL 5   albuterol (VENTOLIN HFA) 108 (90 Base) MCG/ACT inhaler Inhale 2 sprays QID prn wheezing (Patient taking differently: Inhale 2 puffs into the lungs 4 (four) times daily as needed for wheezing or shortness of breath.) 18 g 0   DULoxetine (CYMBALTA) 20 MG capsule Take 1 capsule (20 mg total) by mouth daily. 90 capsule 1   gabapentin (NEURONTIN) 300 MG capsule TAKE 2 CAPSULE IN THE MORNING , 2 AT LUNCH AND 3 AT BEDTIME. 210 capsule 2   meloxicam (MOBIC) 7.5 MG tablet TAKE 1 TABLET BY MOUTH EVERY DAY 30 tablet 1   methocarbamol (ROBAXIN) 750 MG tablet TAKE 1 TABLET BY MOUTH THREE TIMES A DAY AS NEEDED 90 tablet 2   Omega-3 Fatty Acids (FISH OIL) 1200 MG CAPS Take 1,200 mg by mouth at bedtime.     Propylene Glycol (SYSTANE BALANCE)  0.6 % SOLN Place 1 drop into both eyes 2 (two) times daily as needed (dry eyes).     zolpidem (AMBIEN) 10 MG tablet Take 1 tablet (10 mg total) by mouth at bedtime as needed for sleep. 30 tablet 2   HYDROcodone-acetaminophen (NORCO/VICODIN) 5-325 MG tablet Take 1 tablet by mouth every 4 (four) hours as needed for moderate pain. 20 tablet 0   No facility-administered medications prior to visit.    Allergies  Allergen Reactions   Vistaril [Hydroxyzine Hcl] Other (See Comments)    Pt went  into cardiac arrest    Hydromorphone Hcl Hives    Per report from PACU RN, pt had itching on arrival to pacu then developed hives after dilaudid given. Discussed with Dr. Jake Samples and dilaudid allergy being added to record    Dilaudid [Hydromorphone] Rash    ROS Review of Systems  Constitutional:  Negative for chills and fever.  HENT:  Positive for mouth sores. Negative for congestion, sinus pressure, sinus pain and sore throat.   Eyes:  Negative for pain and discharge.  Respiratory:  Negative for cough and shortness of breath.   Cardiovascular:  Negative for chest pain and palpitations.  Gastrointestinal:  Negative for abdominal pain, constipation, diarrhea, nausea and vomiting.  Endocrine: Negative for polydipsia and polyuria.  Genitourinary:  Negative for dysuria and hematuria.  Musculoskeletal:  Positive for back pain and neck pain. Negative for neck stiffness.       B/l hand pain Knee pain  Skin:  Negative for rash.  Neurological:  Positive for weakness (B/l UE) and numbness (B/l UE). Negative for dizziness and headaches.  Psychiatric/Behavioral:  Negative for agitation and behavioral problems.       Objective:    Physical Exam Vitals reviewed.  Constitutional:      General: She is not in acute distress.    Appearance: She is not diaphoretic.  HENT:     Head: Normocephalic and atraumatic.     Nose: Nose normal.     Mouth/Throat:     Mouth: Mucous membranes are dry. Oral lesions present.   Eyes:     General: No scleral icterus.    Extraocular Movements: Extraocular movements intact.     Pupils: Pupils are equal, round, and reactive to light.  Neck:     Comments: Well-healed scar in the cervical (neck) area Cardiovascular:     Rate and Rhythm: Normal rate and regular rhythm.     Pulses: Normal pulses.     Heart sounds: Normal heart sounds. No murmur heard.    No friction rub. No gallop.  Pulmonary:     Breath sounds: Normal breath sounds. No wheezing or rales.  Musculoskeletal:     Cervical back: Neck supple. No tenderness.     Right lower leg: No edema.     Left lower leg: No edema.  Skin:    General: Skin is warm.     Findings: No rash.  Neurological:     General: No focal deficit present.     Mental Status: She is alert and oriented to person, place, and time.     Sensory: No sensory deficit.     Motor: No weakness.  Psychiatric:        Mood and Affect: Mood normal.        Behavior: Behavior normal.     BP 124/82 (BP Location: Right Arm, Patient Position: Sitting, Cuff Size: Normal)   Pulse 67   Resp 18   Ht 5\' 5"  (1.651 m)   Wt 146 lb 12.8 oz (66.6 kg)   SpO2 99%   BMI 24.43 kg/m  Wt Readings from Last 3 Encounters:  02/12/22 146 lb 12.8 oz (66.6 kg)  09/05/21 145 lb (65.8 kg)  09/03/21 152 lb 6.4 oz (69.1 kg)    Lab Results  Component Value Date   TSH 4.000 06/26/2017   Lab Results  Component Value Date   WBC 4.0 09/03/2021   HGB 13.6 09/03/2021   HCT 40.8 09/03/2021   MCV 92.9 09/03/2021   PLT 232 09/03/2021  Lab Results  Component Value Date   NA 142 10/24/2019   K 4.0 10/24/2019   CO2 32 10/24/2019   GLUCOSE 60 (L) 10/24/2019   BUN 9 10/24/2019   CREATININE 0.77 10/24/2019   BILITOT 0.5 04/17/2016   ALKPHOS 39 04/17/2016   AST 87 (H) 04/17/2016   ALT 71 (H) 04/17/2016   PROT 5.6 (L) 04/17/2016   ALBUMIN 3.2 (L) 04/17/2016   CALCIUM 9.6 10/24/2019   ANIONGAP 8 10/24/2019   No results found for: "CHOL" No results found  for: "HDL" No results found for: "LDLCALC" No results found for: "TRIG" No results found for: "CHOLHDL" No results found for: "HGBA1C"    Assessment & Plan:   Problem List Items Addressed This Visit       Digestive   Oral aphthous ulcer - Primary    Recurrent Advised to use Orajel as needed Magic mouthwash as needed for mouth pain Added folic acid 1 mg daily      Relevant Medications   magic mouthwash (lidocaine, diphenhydrAMINE, alum & mag hydroxide) suspension   folic acid (FOLVITE) 1 MG tablet     Musculoskeletal and Integument   HNP (herniated nucleus pulposus), lumbar    S/p lumbar spine surgery Has sciatica pain, referral to Spine surgery Continue Robaxin for muscle spasms On gabapentin 600 mg QAM and at lunch, and 900 mg nightly      Hip arthritis    Tylenol as needed for hip pain Still has episodes of severe hip and knee pain, for supply of Norco as needed for severe pain only Followed by orthopedic surgeon      Relevant Medications   HYDROcodone-acetaminophen (NORCO/VICODIN) 5-325 MG tablet   Primary osteoarthritis of both knees    Tylenol as needed for knee pain Still has episodes of severe hip and knee pain, for supply of Norco as needed for severe pain only Followed by orthopedic surgeon      Relevant Medications   HYDROcodone-acetaminophen (NORCO/VICODIN) 5-325 MG tablet     Other   Insomnia    Well-controlled with Ambien PDMP reviewed, refilled Ambien       Meds ordered this encounter  Medications   magic mouthwash (lidocaine, diphenhydrAMINE, alum & mag hydroxide) suspension    Sig: Swish and spit 5 mLs 3 (three) times daily as needed for mouth pain.    Dispense:  360 mL    Refill:  0   HYDROcodone-acetaminophen (NORCO/VICODIN) 5-325 MG tablet    Sig: Take 1 tablet by mouth every 6 (six) hours as needed for moderate pain or severe pain.    Dispense:  20 tablet    Refill:  0   folic acid (FOLVITE) 1 MG tablet    Sig: Take 1 tablet (1  mg total) by mouth daily.    Dispense:  90 tablet    Refill:  3    Follow-up: Return in about 4 months (around 06/14/2022).    Anabel Halon, MD

## 2022-02-12 NOTE — Assessment & Plan Note (Signed)
Tylenol as needed for knee pain Still has episodes of severe hip and knee pain, for supply of Norco as needed for severe pain only Followed by orthopedic surgeon

## 2022-02-12 NOTE — Progress Notes (Signed)
Mouth pa

## 2022-02-12 NOTE — Assessment & Plan Note (Signed)
Well-controlled with Ambien PDMP reviewed, refilled Ambien 

## 2022-02-12 NOTE — Assessment & Plan Note (Addendum)
S/p lumbar spine surgery Has sciatica pain, referral to Spine surgery Continue Robaxin for muscle spasms On gabapentin 600 mg QAM and at lunch, and 900 mg nightly 

## 2022-02-17 ENCOUNTER — Other Ambulatory Visit: Payer: Self-pay | Admitting: Internal Medicine

## 2022-02-17 ENCOUNTER — Other Ambulatory Visit: Payer: Self-pay | Admitting: Surgical

## 2022-02-17 DIAGNOSIS — M180 Bilateral primary osteoarthritis of first carpometacarpal joints: Secondary | ICD-10-CM

## 2022-02-17 DIAGNOSIS — F5101 Primary insomnia: Secondary | ICD-10-CM

## 2022-03-19 ENCOUNTER — Other Ambulatory Visit: Payer: Self-pay | Admitting: Internal Medicine

## 2022-03-19 DIAGNOSIS — Z9889 Other specified postprocedural states: Secondary | ICD-10-CM

## 2022-03-19 DIAGNOSIS — M4722 Other spondylosis with radiculopathy, cervical region: Secondary | ICD-10-CM

## 2022-03-27 ENCOUNTER — Other Ambulatory Visit: Payer: Self-pay | Admitting: Internal Medicine

## 2022-03-27 ENCOUNTER — Other Ambulatory Visit (HOSPITAL_COMMUNITY): Payer: Self-pay | Admitting: Neurological Surgery

## 2022-03-27 DIAGNOSIS — M4722 Other spondylosis with radiculopathy, cervical region: Secondary | ICD-10-CM

## 2022-03-27 DIAGNOSIS — Z9889 Other specified postprocedural states: Secondary | ICD-10-CM

## 2022-04-08 ENCOUNTER — Ambulatory Visit (INDEPENDENT_AMBULATORY_CARE_PROVIDER_SITE_OTHER): Payer: Medicare Other | Admitting: Internal Medicine

## 2022-04-08 ENCOUNTER — Encounter: Payer: Self-pay | Admitting: Internal Medicine

## 2022-04-08 DIAGNOSIS — J0110 Acute frontal sinusitis, unspecified: Secondary | ICD-10-CM

## 2022-04-08 LAB — POCT INFLUENZA A/B
Influenza A, POC: NEGATIVE
Influenza B, POC: NEGATIVE

## 2022-04-08 MED ORDER — AMOXICILLIN-POT CLAVULANATE 875-125 MG PO TABS: 1.0000 | ORAL_TABLET | Freq: Two times a day (BID) | ORAL | 0 refills | Status: DC

## 2022-04-08 MED ORDER — DEXTROMETHORPHAN HBR 15 MG/5ML PO SYRP: 10.0000 mL | ORAL_SOLUTION | Freq: Four times a day (QID) | ORAL | 0 refills | Status: DC | PRN

## 2022-04-08 NOTE — Progress Notes (Signed)
Acute Office Visit  Subjective:    Patient ID: Stephanie Noble, female    DOB: 12-15-1955, 66 y.o.   MRN: [id number]  Chief Complaint  Patient presents with   Cough    Started over the weekend with sneezing and sinus drainage. Started taking mucinex with expectorant. Non productive, starting to affect her breathing, chest feels tight. Had to use albuterol inhaler yesterday but not helping much. No fever, chills or body aches but does feel fatigued.    This is a telephone encounter between Mickle Mallory and Lorene Dy on 04/08/2022 for cough. The visit was conducted with the patient located at home and Lorene Dy at [facility]. The patient's identity was confirmed using their DOB and current address. The patient has consented to being evaluated through a telephone encounter and understands the associated risks (an examination cannot be done and the patient may need to come in for an appointment) / benefits (allows the patient to remain at home, decreasing exposure to coronavirus).   HPI Patients symptoms started on Saturday with a runny nose and sneezing. On Sunday she started having a non productive cough and the cough is keeping her from sleeping. She describes a feeling of malaise, having some mild shortness of breath, and feels weak. Her objective temp was 99.2 at home. She tried Mucinex DM. Albuterol she has for Asthma has not helped with shortness of breath. Cough is keeping her up.She has a  history  of developing bronchitis. Her grandchildren had runny nose and were visiting a week ago.   Past Medical History:  Diagnosis Date   Anxiety    Asthma    uses albuterol as needed "mostly weather induced".ALbuterol and Ventolin inhaler as needed   Chronic back pain    Chronic neck pain    Complication of anesthesia    post-op PEA arrest 04/16/16, thought due to respriatory arrest from sedation, narcotic, hypovolemia   Degenerative disc disease    cervical and lumbar area   Depression     History of bronchitis 2016   History of cervical spinal surgery 12/30/2019   History of lumbar surgery 12/30/2019   Insomnia    takes Ambien nightly   Muscle spasm    takes Robaxin daily    Pneumonia    hx of-2016   PONV (postoperative nausea and vomiting)    Weakness    numbness and tingling in legs    Past Surgical History:  Procedure Laterality Date   ANTERIOR CERVICAL DECOMP/DISCECTOMY FUSION N/A 09/05/2021   Procedure: CERVICAL THREE-FOUR ANTERIOR CERVICAL DECOMPRESSION/DISCECTOMY FUSION WITH PARTIAL PLATE REMOVAL AT CERVICAL FOUR-FIVE;  Surgeon: Karsten Ro, DO;  Location: [place];  Service: Neurosurgery;  Laterality: N/A;  3C   BACK SURGERY     x 3 lumbar   CHOLECYSTECTOMY     DILATION AND CURETTAGE OF UTERUS     x 2   JOINT REPLACEMENT     KNEE SURGERY Left    x 2   left knee arthroscopy      x 2   POSTERIOR CERVICAL FUSION/FORAMINOTOMY  05/17/2012   Procedure: POSTERIOR CERVICAL FUSION/FORAMINOTOMY LEVEL 2;  Surgeon: Hosie Spangle, MD;  Location: [place] [facility];  Service: Neurosurgery;  Laterality: N/A;  cervical six to Thoracic One posterior cervical arthrodesis with instrumentation   SHOULDER ARTHROSCOPY Left 10/27/2019   Procedure: left shoulder arthroscopy with arthroscopic vs open distal clavical excision;  Surgeon: Meredith Pel, MD;  Location: [place];  Service: Orthopedics;  Laterality:  Left;   SHOULDER SURGERY     right   SPINE SURGERY     x 5   TOE FUSION Left 09/2016   TOTAL HIP ARTHROPLASTY Left 11/09/2018   Procedure: LEFT TOTAL HIP ARTHROPLASTY DIRECT ANTERIOR APPROACH;  Surgeon: Cammy Copa, MD;  Location: [facility];  Service: Orthopedics;  Laterality: Left;    Family History  Problem Relation Age of Onset   Dementia Mother    Other Sister    Breast cancer Sister    Alcohol abuse Other    Breast cancer Paternal Aunt     Social History   Socioeconomic History   Marital status: Married    Spouse name: Not on file   Number of  children: Not on file   Years of education: Not on file   Highest education level: Not on file  Occupational History   Not on file  Tobacco Use   Smoking status: Never   Smokeless tobacco: Never  Vaping Use   Vaping Use: Never used  Substance and Sexual Activity   Alcohol use: No   Drug use: No   Sexual activity: Yes    Birth control/protection: Post-menopausal  Other Topics Concern   Not on file  Social History Narrative   Not on file   Social Determinants of Health   Financial Resource Strain: Not on file  Food Insecurity: Not on file  Transportation Needs: Not on file  Physical Activity: Not on file  Stress: Not on file  Social Connections: Not on file  Intimate Partner Violence: Not on file    Outpatient Medications Prior to Visit  Medication Sig Dispense Refill   albuterol (PROVENTIL) (2.5 MG/3ML) 0.083% nebulizer solution Take 3 mLs (2.5 mg total) by nebulization every 4 (four) hours as needed for wheezing or shortness of breath. 150 mL 5   albuterol (VENTOLIN HFA) 108 (90 Base) MCG/ACT inhaler Inhale 2 sprays QID prn wheezing (Patient taking differently: Inhale 2 puffs into the lungs 4 (four) times daily as needed for wheezing or shortness of breath.) 18 g 0   DULoxetine (CYMBALTA) 20 MG capsule Take 1 capsule (20 mg total) by mouth daily. 90 capsule 1   folic acid (FOLVITE) 1 MG tablet Take 1 tablet (1 mg total) by mouth daily. 90 tablet 3   gabapentin (NEURONTIN) 300 MG capsule TAKE 2 CAPSULE IN THE MORNING , 2 AT LUNCH AND 3 AT BEDTIME 180 capsule 2   HYDROcodone-acetaminophen (NORCO/VICODIN) 5-325 MG tablet Take 1 tablet by mouth every 6 (six) hours as needed for moderate pain or severe pain. 20 tablet 0   meloxicam (MOBIC) 7.5 MG tablet TAKE 1 TABLET BY MOUTH EVERY DAY 30 tablet 1   methocarbamol (ROBAXIN) 750 MG tablet TAKE 1 TABLET BY MOUTH THREE TIMES A DAY AS NEEDED 90 tablet 2   Omega-3 Fatty Acids (FISH OIL) 1200 MG CAPS Take 1,200 mg by mouth at bedtime.      Propylene Glycol (SYSTANE BALANCE) 0.6 % SOLN Place 1 drop into both eyes 2 (two) times daily as needed (dry eyes).     zolpidem (AMBIEN) 10 MG tablet TAKE 1 TABLET BY MOUTH AT BEDTIME AS NEEDED FOR SLEEP. 30 tablet 2   magic mouthwash (lidocaine, diphenhydrAMINE, alum & mag hydroxide) suspension Swish and spit 5 mLs 3 (three) times daily as needed for mouth pain. 360 mL 0   No facility-administered medications prior to visit.    Allergies  Allergen Reactions   Vistaril [Hydroxyzine Hcl] Other (See Comments)  Pt went into cardiac arrest    Hydromorphone Hcl Hives    Per report from PACU RN, pt had itching on arrival to pacu then developed hives after dilaudid given. Discussed with Dr. Jake Samples and dilaudid allergy being added to record    Dilaudid [Hydromorphone] Rash    Review of Systems  Constitutional:  Positive for fatigue. Negative for chills.  Respiratory:  Positive for cough and shortness of breath.        Objective:      There were no vitals taken for this visit. Wt Readings from Last 3 Encounters:  02/12/22 146 lb 12.8 oz (66.6 kg)  09/05/21 145 lb (65.8 kg)  09/03/21 152 lb 6.4 oz (69.1 kg)        Assessment & Plan:   Problem List Items Addressed This Visit       Respiratory   Acute non-recurrent frontal sinusitis - Primary    Assessment/Plan: Acute non-recurrent frontal sinusitis. Patient is negative for Influenza A&B, Covid test is pending. We discussed if symptoms improve it is likely viral pathogen. If not improving by day 7 , she will pick up antibiotic. Dextromethorphan prescribed for cough.        Relevant Medications   amoxicillin-clavulanate (AUGMENTIN) 875-125 MG tablet   dextromethorphan 15 MG/5ML syrup   Other Relevant Orders   POCT Influenza A/B (Completed)   Novel Coronavirus, NAA ([facility])     Meds ordered this encounter  Medications   amoxicillin-clavulanate (AUGMENTIN) 875-125 MG tablet    Sig: Take 1 tablet by mouth 2 (two)  times daily.    Dispense:  14 tablet    Refill:  0   dextromethorphan 15 MG/5ML syrup    Sig: Take 10 mLs (30 mg total) by mouth 4 (four) times daily as needed for cough.    Dispense:  120 mL    Refill:  0     Milus Banister, MD

## 2022-04-08 NOTE — Patient Instructions (Signed)
Thank you for trusting me with your care. To recap, today we discussed the following:   You are on day 5 of symptoms which present like acute sinusitis. Your Influenza A and B are negative. We will update you when your Covid test results. If not improving start antibiotic on day 7. I sent a cough suppressant to your pharmacy.

## 2022-04-08 NOTE — Assessment & Plan Note (Addendum)
Assessment/Plan: Acute non-recurrent frontal sinusitis. Patient is negative for Influenza A&B, Covid test is pending. We discussed if symptoms improve it is likely viral pathogen. If not improving by day 7 , she will pick up antibiotic. Dextromethorphan prescribed for cough.

## 2022-04-10 LAB — NOVEL CORONAVIRUS, NAA: SARS-CoV-2, NAA: NOT DETECTED

## 2022-04-16 ENCOUNTER — Other Ambulatory Visit: Payer: Self-pay | Admitting: Internal Medicine

## 2022-04-16 DIAGNOSIS — M4800 Spinal stenosis, site unspecified: Secondary | ICD-10-CM

## 2022-04-16 DIAGNOSIS — G8929 Other chronic pain: Secondary | ICD-10-CM

## 2022-04-22 ENCOUNTER — Ambulatory Visit (HOSPITAL_COMMUNITY)
Admission: RE | Admit: 2022-04-22 | Discharge: 2022-04-22 | Disposition: A | Discharge status: Home / Self Care | Payer: Medicare Other | Source: Ambulatory Visit | Attending: Neurological Surgery | Admitting: Neurological Surgery

## 2022-04-22 DIAGNOSIS — M4722 Other spondylosis with radiculopathy, cervical region: Secondary | ICD-10-CM | POA: Diagnosis present

## 2022-05-01 ENCOUNTER — Other Ambulatory Visit: Payer: Self-pay | Admitting: Surgical

## 2022-05-01 DIAGNOSIS — M180 Bilateral primary osteoarthritis of first carpometacarpal joints: Secondary | ICD-10-CM

## 2022-05-08 ENCOUNTER — Encounter: Payer: Self-pay | Admitting: Orthopedic Surgery

## 2022-05-15 ENCOUNTER — Other Ambulatory Visit: Payer: Self-pay | Admitting: Internal Medicine

## 2022-05-15 ENCOUNTER — Other Ambulatory Visit: Payer: Self-pay

## 2022-05-15 DIAGNOSIS — F5101 Primary insomnia: Secondary | ICD-10-CM

## 2022-05-15 DIAGNOSIS — M4722 Other spondylosis with radiculopathy, cervical region: Secondary | ICD-10-CM

## 2022-05-15 DIAGNOSIS — Z9889 Other specified postprocedural states: Secondary | ICD-10-CM

## 2022-05-15 MED ORDER — DULOXETINE HCL 20 MG PO CPEP: 20.0000 mg | ORAL_CAPSULE | Freq: Every day | ORAL | 1 refills | Status: DC

## 2022-05-15 NOTE — Telephone Encounter (Signed)
Please advise on zolpidem I sent the duloxetine already.

## 2022-05-20 ENCOUNTER — Other Ambulatory Visit: Payer: Self-pay | Admitting: Neurological Surgery

## 2022-06-02 ENCOUNTER — Other Ambulatory Visit: Payer: Self-pay | Admitting: Neurological Surgery

## 2022-06-09 NOTE — Pre-Procedure Instructions (Signed)
Surgical Instructions    Your procedure is scheduled on June 17, 2022.  Report to [facility] Main Entrance "A" at 9:25 A.M., then check in with the Admitting office.  Call this number if you have problems the morning of surgery:  [phone]   If you have any questions prior to your surgery date call [phone]: Open Monday-Friday 8am-4pm If you experience any cold or flu symptoms such as cough, fever, chills, shortness of breath, etc. between now and your scheduled surgery, please notify us at the above number     Remember:  Do not eat or drink after midnight the night before your surgery    Take these medicines the morning of surgery with A SIP OF WATER:  DULoxetine (CYMBALTA)   gabapentin (NEURONTIN)   methocarbamol (ROBAXIN)      Take these medicines the morning of surgery with a sip of water AS NEEDED:  albuterol (PROVENTIL) nebulizer solution   albuterol (VENTOLIN HFA) inhaler   HYDROcodone-acetaminophen (NORCO/VICODIN)   Propylene Glycol (SYSTANE BALANCE) eye drops   As of today, STOP taking any Aspirin (unless otherwise instructed by your surgeon) Aleve, Naproxen, Ibuprofen, Motrin, Advil, Goody's, BC's, all herbal medications, fish oil, and all vitamins. This includes your medication: meloxicam (MOBIC)                      Do NOT Smoke (Tobacco/Vaping) for 24 hours prior to your procedure.  If you use a CPAP at night, you may bring your mask/headgear for your overnight stay.   Contacts, glasses, piercing's, hearing aid's, dentures or partials may not be worn into surgery, please bring cases for these belongings.    For patients admitted to the hospital, discharge time will be determined by your treatment team.   Patients discharged the day of surgery will not be allowed to drive home, and someone needs to stay with them for 24 hours.  SURGICAL WAITING ROOM VISITATION Patients having surgery or a procedure may have no more than 2 support people in the waiting  area - these visitors may rotate.   Children under the age of 29 must have an adult with them who is not the patient. If the patient needs to stay at the hospital during part of their recovery, the visitor guidelines for inpatient rooms apply. Pre-op nurse will coordinate an appropriate time for 1 support person to accompany patient in pre-op.  This support person may not rotate.   Please refer to the [facility] website for the visitor guidelines for Inpatients (after your surgery is over and you are in a regular room).    Special instructions:   [place]- Preparing For Surgery  Before surgery, you can play an important role. Because skin is not sterile, your skin needs to be as free of germs as possible. You can reduce the number of germs on your skin by washing with CHG (chlorahexidine gluconate) Soap before surgery.  CHG is an antiseptic cleaner which kills germs and bonds with the skin to continue killing germs even after washing.    Oral Hygiene is also important to reduce your risk of infection.  Remember - BRUSH YOUR TEETH THE MORNING OF SURGERY WITH YOUR REGULAR TOOTHPASTE  Please do not use if you have an allergy to CHG or antibacterial soaps. If your skin becomes reddened/irritated stop using the CHG.  Do not shave (including legs and underarms) for at least 48 hours prior to first CHG shower. It is OK to shave your face.  Please follow these instructions carefully.   Shower the NIGHT BEFORE SURGERY and the MORNING OF SURGERY  If you chose to wash your hair, wash your hair first as usual with your normal shampoo.  After you shampoo, rinse your hair and body thoroughly to remove the shampoo.  Use CHG Soap as you would any other liquid soap. You can apply CHG directly to the skin and wash gently with a scrungie or a clean washcloth.   Apply the CHG Soap to your body ONLY FROM THE NECK DOWN.  Do not use on open wounds or open sores. Avoid contact with your eyes, ears, mouth and  genitals (private parts). Wash Face and genitals (private parts)  with your normal soap.   Wash thoroughly, paying special attention to the area where your surgery will be performed.  Thoroughly rinse your body with warm water from the neck down.  DO NOT shower/wash with your normal soap after using and rinsing off the CHG Soap.  Pat yourself dry with a CLEAN TOWEL.  Wear CLEAN PAJAMAS to bed the night before surgery  Place CLEAN SHEETS on your bed the night before your surgery  DO NOT SLEEP WITH PETS.   Day of Surgery: Take a shower with CHG soap. Do not wear jewelry or makeup Do not wear lotions, powders, perfumes/colognes, or deodorant. Do not shave 48 hours prior to surgery.  Men may shave face and neck. Do not bring valuables to the hospital.  [facility] is not responsible for any belongings or valuables. Do not wear nail polish, gel polish, artificial nails, or any other type of covering on natural nails (fingers and toes) If you have artificial nails or gel coating that need to be removed by a nail salon, please have this removed prior to surgery. Artificial nails or gel coating may interfere with anesthesia's ability to adequately monitor your vital signs.  Wear Clean/Comfortable clothing the morning of surgery Remember to brush your teeth WITH YOUR REGULAR TOOTHPASTE.   Please read over the following fact sheets that you were given.    If you received a COVID test during your pre-op visit  it is requested that you wear a mask when out in public, stay away from anyone that may not be feeling well and notify your surgeon if you develop symptoms. If you have been in contact with anyone that has tested positive in the last 10 days please notify you surgeon.

## 2022-06-10 ENCOUNTER — Encounter (HOSPITAL_COMMUNITY)
Admission: RE | Admit: 2022-06-10 | Discharge: 2022-06-10 | Disposition: A | Discharge status: Home / Self Care | Payer: Medicare Other | Source: Ambulatory Visit | Attending: Neurological Surgery | Admitting: Neurological Surgery

## 2022-06-10 ENCOUNTER — Other Ambulatory Visit: Payer: Self-pay

## 2022-06-10 ENCOUNTER — Encounter (HOSPITAL_COMMUNITY): Payer: Self-pay

## 2022-06-10 VITALS — BP 128/82 | HR 65 | Temp 97.4°F | Resp 18 | Ht 65.0 in | Wt 146.5 lb

## 2022-06-10 DIAGNOSIS — Z01812 Encounter for preprocedural laboratory examination: Secondary | ICD-10-CM | POA: Diagnosis not present

## 2022-06-10 DIAGNOSIS — Z01818 Encounter for other preprocedural examination: Secondary | ICD-10-CM

## 2022-06-10 LAB — SURGICAL PCR SCREEN
MRSA, PCR: NEGATIVE
Staphylococcus aureus: NEGATIVE

## 2022-06-10 LAB — CBC
HCT: 41 % (ref 36.0–46.0)
Hemoglobin: 13.9 g/dL (ref 12.0–15.0)
MCH: 30.8 pg (ref 26.0–34.0)
MCHC: 33.9 g/dL (ref 30.0–36.0)
MCV: 90.9 fL (ref 80.0–100.0)
Platelets: 222 10*3/uL (ref 150–400)
RBC: 4.51 MIL/uL (ref 3.87–5.11)
RDW: 12 % (ref 11.5–15.5)
WBC: 4.2 10*3/uL (ref 4.0–10.5)
nRBC: 0 % (ref 0.0–0.2)

## 2022-06-10 LAB — TYPE AND SCREEN
ABO/RH(D): O POS
Antibody Screen: NEGATIVE

## 2022-06-10 NOTE — Progress Notes (Signed)
PCP - Dr. Trena Platt Cardiologist - denies  PPM/ICD - n/a  Chest x-ray - n/a EKG - n/a Stress Test - denies ECHO - 04/19/2016 Cardiac Cath - denies  Sleep Study - denies CPAP - denies  Last dose of GLP1 agonist-  n/a GLP1 instructions: n/a  Blood Thinner Instructions: n/a Aspirin Instructions: n/a  NPO  COVID TEST- n/a   Anesthesia review: Yes, anesthesia complications? Pt had a cardiac arrest post surgery in 2017.   Patient denies shortness of breath, fever, cough and chest pain at PAT appointment   All instructions explained to the patient, with a verbal understanding of the material. Patient agrees to go over the instructions while at home for a better understanding. Patient also instructed to self quarantine after being tested for COVID-19. The opportunity to ask questions was provided.

## 2022-06-11 ENCOUNTER — Other Ambulatory Visit: Payer: Self-pay | Admitting: Internal Medicine

## 2022-06-11 DIAGNOSIS — F5101 Primary insomnia: Secondary | ICD-10-CM

## 2022-06-11 NOTE — Progress Notes (Signed)
Anesthesia Chart Review:  Case: [id number] Date/Time: 06/17/22 1110   Procedure: POSTERIOR CERVICAL INSTRUMENTATION AND FUSION , C23, C34, C45; EXPLORE FUSION C6-T1   Anesthesia type: General   Pre-op diagnosis: PSEUDOARTHROSIS OF CERVICAL SPINE   [facility] RO[facility] / [facility] OR   Surgeons: Dawley, Alan Mulder, DO       DISCUSSION: Patient is a 24 female scheduled for the above procedure.  History includes never smoker, post-operative N/V, asthma, insomnia, spinal surgery (L3-5 PLIF 07/16/09; C4-7 ACDF 04/16/11; C6-T1 posterior arthrodesis 05/17/12; explant L5 hardware, L2-3 posterolateral arthrodesis 04/16/16; C3-4 ACDF 09/05/21), cholecystectomy (11/10/05), osteoarthritis (left THA 11/09/18), post-operative PEA arrest 04/16/16 (in PACU; likely hypoxemic induced PEA arrest due to acute respiratory failure following narcotic administration).   Following 04/16/16 lumbar fusion she had a PEA arrest. According to records, she had been given Percocet and Celebrex for back pain and Vistaril for facial itching in PACU at 1915. Patient feeling better at 1945, but at 2000, husband reported she "Just fell asleep after a hard deep breath". Patient noted to be have "purplish" lips and was unresponsive and pulseless. [facility] [employer] called and CPR initiated for approximately 5 minutes before ROSC.  Code documentation indicates first observed rhythm was asystole (she was not on telemetry at the time of initial arrest) with SR with PVCs after ROSC. She received Narcan 0.4 mg. She vomited 3 minutes after ROSC. Glucose 169. PCCM was consulted. Allergic reaction was considered given her itching, but no rash or angioedema noted, so the felt most likely explanation for her cardiac arrest was due to acute respiratory failure due to narcotic administration leading to hypoxemic induced PEA arrest. EKG findings did show findings worrisome for ischemia. Cardiac enzymes 0.03-0.24 H-0.18H. Off pressors by 04/18/16. CXR showed low lung volumes  without focal disease. 04/19/16 Echo showed normal LVEF with normal wall motion. Cardiology was not consulted. She was discharged home on 04/20/16.    Since then she underwent left THA on 11/09/18, left shoulder arthroscopy on 10/27/19, and C3-4 ACDF on 09/05/21, all under GETA and without post-operative complication.   Anesthesia team to evaluate on the day of surgery.    VS: BP 128/82   Pulse 65   Temp (!) 36.3 C   Resp 18   Ht 5\' 5"  (1.651 m)   Wt 66.5 kg   SpO2 100%   BMI 24.38 kg/m    PROVIDERS: , MD is PCP    LABS: Labs reviewed: Acceptable for surgery. (all labs ordered are listed, but only abnormal results are displayed)  Labs Reviewed  SURGICAL PCR SCREEN  CBC  TYPE AND SCREEN    IMAGES: CT C-spine 04/22/22: IMPRESSION: 1. ACDF C3-4 with mild left foraminal stenosis. 2. ACDF C4-5 with pseudarthrosis. Mild loosening of the C4 screws bilaterally. 3. ACDF C5-6 and C6-7 with solid fusion. 4. Posterior fusion C7-T1 with mild anterolisthesis and mild disc degeneration. Negative for stenosis.   EKG: 11/05/18: NSR at 68 bpm     CV: Echo 04/19/16: Study Conclusions  - Left ventricle: The cavity size was normal. Wall thickness was    normal. Systolic function was normal. The estimated ejection    fraction was in the range of 60% to 65%. Wall motion was normal;    there were no regional wall motion abnormalities. Left    ventricular diastolic function parameters were normal.  - Aortic valve: Valve area (VTI): 3.78 cm^2. Valve area (Vmax):    4.28 cm^2.  - Atrial septum: No defect  or patent foramen ovale was identified.  - Pulmonary arteries: Systolic pressure was mildly increased. PA    peak pressure: 36 mm Hg (S).  - Technically adequate study.    Past Medical History:  Diagnosis Date   Anxiety    Asthma    uses albuterol as needed "mostly weather induced".ALbuterol and Ventolin inhaler as needed   Chronic back pain    Chronic neck pain     Complication of anesthesia    post-op PEA arrest 04/16/16, thought due to respriatory arrest from sedation, narcotic, hypovolemia   Degenerative disc disease    cervical and lumbar area   Depression    History of bronchitis 2016   History of cervical spinal surgery 12/30/2019   History of lumbar surgery 12/30/2019   Insomnia    takes Ambien nightly   Muscle spasm    takes Robaxin daily    Pneumonia    hx of-2016   PONV (postoperative nausea and vomiting)    Weakness    numbness and tingling in legs    Past Surgical History:  Procedure Laterality Date   ANTERIOR CERVICAL DECOMP/DISCECTOMY FUSION N/A 09/05/2021   Procedure: CERVICAL THREE-FOUR ANTERIOR CERVICAL DECOMPRESSION/DISCECTOMY FUSION WITH PARTIAL PLATE REMOVAL AT CERVICAL FOUR-FIVE;  Surgeon: Bethann Goo, DO;  Location: [facility] OR;  Service: Neurosurgery;  Laterality: N/A;  3C   BACK SURGERY     x 3 lumbar   CHOLECYSTECTOMY     DILATION AND CURETTAGE OF UTERUS     x 2   JOINT REPLACEMENT     KNEE SURGERY Left    x 2   left knee arthroscopy      x 2   POSTERIOR CERVICAL FUSION/FORAMINOTOMY  05/17/2012   Procedure: POSTERIOR CERVICAL FUSION/FORAMINOTOMY LEVEL 2;  Surgeon: Hewitt Shorts, MD;  Location: [facility] NEURO ORS;  Service: Neurosurgery;  Laterality: N/A;  cervical six to Thoracic One posterior cervical arthrodesis with instrumentation   SHOULDER ARTHROSCOPY Left 10/27/2019   Procedure: left shoulder arthroscopy with arthroscopic vs open distal clavical excision;  Surgeon: Cammy Copa, MD;  Location: [facility] OR;  Service: Orthopedics;  Laterality: Left;   SHOULDER SURGERY     right   SPINE SURGERY     x 5   TOE FUSION Left 09/2016   TOTAL HIP ARTHROPLASTY Left 11/09/2018   Procedure: LEFT TOTAL HIP ARTHROPLASTY DIRECT ANTERIOR APPROACH;  Surgeon: Cammy Copa, MD;  Location: [facility] ORS;  Service: Orthopedics;  Laterality: Left;    MEDICATIONS:  albuterol (PROVENTIL) (2.5 MG/3ML) 0.083% nebulizer solution    albuterol (VENTOLIN HFA) 108 (90 Base) MCG/ACT inhaler   DULoxetine (CYMBALTA) 20 MG capsule   folic acid (FOLVITE) 1 MG tablet   gabapentin (NEURONTIN) 300 MG capsule   HYDROcodone-acetaminophen (NORCO/VICODIN) 5-325 MG tablet   meloxicam (MOBIC) 7.5 MG tablet   methocarbamol (ROBAXIN) 750 MG tablet   Omega-3 Fatty Acids (FISH OIL) 1200 MG CAPS   Propylene Glycol (SYSTANE BALANCE) 0.6 % SOLN   zolpidem (AMBIEN) 10 MG tablet   No current facility-administered medications for this encounter.    Shonna Chock, PA-C Surgical Short Stay/Anesthesiology [facility] Phone [phone] [facility] Phone [phone] 06/11/2022 2:55 PM

## 2022-06-12 ENCOUNTER — Encounter: Payer: Self-pay | Admitting: Internal Medicine

## 2022-06-12 ENCOUNTER — Ambulatory Visit (INDEPENDENT_AMBULATORY_CARE_PROVIDER_SITE_OTHER): Payer: Medicare Other | Admitting: Internal Medicine

## 2022-06-12 DIAGNOSIS — M4722 Other spondylosis with radiculopathy, cervical region: Secondary | ICD-10-CM | POA: Diagnosis not present

## 2022-06-12 DIAGNOSIS — F5101 Primary insomnia: Secondary | ICD-10-CM

## 2022-06-12 DIAGNOSIS — F4310 Post-traumatic stress disorder, unspecified: Secondary | ICD-10-CM

## 2022-06-12 DIAGNOSIS — F5104 Psychophysiologic insomnia: Secondary | ICD-10-CM | POA: Diagnosis not present

## 2022-06-12 DIAGNOSIS — M170 Bilateral primary osteoarthritis of knee: Secondary | ICD-10-CM

## 2022-06-12 MED ORDER — ZOLPIDEM TARTRATE 10 MG PO TABS: 10.0000 mg | ORAL_TABLET | Freq: Every evening | ORAL | 3 refills | Status: DC | PRN

## 2022-06-12 NOTE — Assessment & Plan Note (Signed)
Well-controlled with Ambien PDMP reviewed, refilled Ambien 

## 2022-06-12 NOTE — Assessment & Plan Note (Signed)
S/p C3-C4 decompression with fusion surgery Has persistent neck pain On gabapentin, Cymbalta and Robaxin Followed by Spine Surgery - planned to get revision of cervical fusion 

## 2022-06-12 NOTE — Patient Instructions (Signed)
Please continue taking medications as prescribed.

## 2022-06-12 NOTE — Progress Notes (Signed)
Virtual Visit via Telephone Note   This visit type was conducted via telephone. This format is felt to be most appropriate for this patient at this time.  The patient did not have access to video technology/had technical difficulties with video requiring transitioning to audio format only (telephone).  All issues noted in this document were discussed and addressed.  No physical exam could be performed with this format.  Evaluation Performed:  Follow-up visit  Date:  06/12/2022   ID:  Stephanie Noble, Stephanie Noble [birth year], MRN [id number]  Patient Location: Home Provider Location: Office/Clinic  Participants: Patient Location of Patient: Home Location of Provider: Telehealth Consent was obtain for visit to be over via telehealth. I verified that I am speaking with the correct person using two identifiers.  PCP:  Anabel Halon, MD   Chief Complaint: Insomnia  History of Present Illness:    Stephanie Noble is a 66 y.o. female with PMH of cervical spinal stenosis s/p fusion surgery, lumbar disc herniation s/p surgery, history of cardiac arrest related to operative analgesia and asthma who has a televisit for f/u of her chronic medical conditions.  Patient has a longstanding history of insomnia, for which she has been taking Ambien.  She denies any anhedonia, recent weight or appetite changes or suicidal ideation.  Patient has been having chronic neck pain, which is constant, variable in intensity, worse with movement and is associated with numbness and weakness in bilateral upper extremities.  Patient has had fusion surgery in 2013 and 2014.  Patient takes Robaxin for muscle spasms.  Patient takes gabapentin and Cymbalta for neuropathy pain.  Patient used to see pain specialist in the past.  She also complains of chronic knee pain and swelling, for which she takes Mobic as needed, with some relief. She sees Dr August Saucer for OA of knee.  The patient does not have symptoms concerning for  COVID-19 infection (fever, chills, cough, or new shortness of breath).   Past Medical, Surgical, Social History, Allergies, and Medications have been Reviewed.  Past Medical History:  Diagnosis Date   Anxiety    Asthma    uses albuterol as needed "mostly weather induced".ALbuterol and Ventolin inhaler as needed   Chronic back pain    Chronic neck pain    Complication of anesthesia    post-op PEA arrest 04/16/16, thought due to respriatory arrest from sedation, narcotic, hypovolemia   Degenerative disc disease    cervical and lumbar area   Depression    History of bronchitis 2016   History of cervical spinal surgery 12/30/2019   History of lumbar surgery 12/30/2019   Insomnia    takes Ambien nightly   Muscle spasm    takes Robaxin daily    Pneumonia    hx of-2016   PONV (postoperative nausea and vomiting)    Weakness    numbness and tingling in legs   Past Surgical History:  Procedure Laterality Date   ANTERIOR CERVICAL DECOMP/DISCECTOMY FUSION N/A 09/05/2021   Procedure: CERVICAL THREE-FOUR ANTERIOR CERVICAL DECOMPRESSION/DISCECTOMY FUSION WITH PARTIAL PLATE REMOVAL AT CERVICAL FOUR-FIVE;  Surgeon: Bethann Goo, DO;  Location: [facility] OR;  Service: Neurosurgery;  Laterality: N/A;  3C   BACK SURGERY     x 3 lumbar   CHOLECYSTECTOMY     DILATION AND CURETTAGE OF UTERUS     x 2   JOINT REPLACEMENT     KNEE SURGERY Left    x 2   left knee arthroscopy  x 2   POSTERIOR CERVICAL FUSION/FORAMINOTOMY  05/17/2012   Procedure: POSTERIOR CERVICAL FUSION/FORAMINOTOMY LEVEL 2;  Surgeon: Hewitt Shorts, MD;  Location: [facility] NEURO ORS;  Service: Neurosurgery;  Laterality: N/A;  cervical six to Thoracic One posterior cervical arthrodesis with instrumentation   SHOULDER ARTHROSCOPY Left 10/27/2019   Procedure: left shoulder arthroscopy with arthroscopic vs open distal clavical excision;  Surgeon: Cammy Copa, MD;  Location: [facility] OR;  Service: Orthopedics;  Laterality: Left;   SHOULDER  SURGERY     right   SPINE SURGERY     x 5   TOE FUSION Left 09/2016   TOTAL HIP ARTHROPLASTY Left 11/09/2018   Procedure: LEFT TOTAL HIP ARTHROPLASTY DIRECT ANTERIOR APPROACH;  Surgeon: Cammy Copa, MD;  Location: [facility] ORS;  Service: Orthopedics;  Laterality: Left;     Current Meds  Medication Sig   mupirocin ointment (BACTROBAN) 2 % Apply topically.     Allergies:   Vistaril [hydroxyzine hcl], Hydromorphone hcl, and Dilaudid [hydromorphone]   ROS:   Please see the history of present illness.     All other systems reviewed and are negative.   Labs/Other Tests and Data Reviewed:    Recent Labs: 06/10/2022: Hemoglobin 13.9; Platelets 222   Recent Lipid Panel No results found for: "CHOL", "TRIG", "HDL", "CHOLHDL", "LDLCALC", "LDLDIRECT"  Wt Readings from Last 3 Encounters:  06/10/22 146 lb 8 oz (66.5 kg)  02/12/22 146 lb 12.8 oz (66.6 kg)  09/05/21 145 lb (65.8 kg)     ASSESSMENT & PLAN:    Cervical spondylosis with radiculopathy S/p C3-C4 decompression with fusion surgery Has persistent neck pain On gabapentin, Cymbalta and Robaxin Followed by Spine Surgery - planned to get revision of cervical fusion  Insomnia Well-controlled with Ambien PDMP reviewed, refilled Ambien  PTSD (post-traumatic stress disorder) Stable Has insomnia, takes Ambien  Primary osteoarthritis of both knees Tylenol as needed for knee pain Still has episodes of severe hip and knee pain, had given short supply of Norco as needed for severe pain only Followed by orthopedic surgeon   Time:   Today, I have spent 16 minutes reviewing the chart, including problem list, medications, and with the patient with telehealth technology discussing the above problems.   Medication Adjustments/Labs and Tests Ordered: Current medicines are reviewed at length with the patient today.  Concerns regarding medicines are outlined above.   Tests Ordered: No orders of the defined types were placed in  this encounter.   Medication Changes: Meds ordered this encounter  Medications   zolpidem (AMBIEN) 10 MG tablet    Sig: Take 1 tablet (10 mg total) by mouth at bedtime as needed for sleep.    Dispense:  30 tablet    Refill:  3    Not to exceed 3 additional fills before 08/16/2022     Note: This dictation was prepared with Dragon dictation along with smaller phrase technology. Similar sounding words can be transcribed inadequately or may not be corrected upon review. Any transcriptional errors that result from this process are unintentional.      Disposition:  Follow up  Signed, Anabel Halon, MD  06/12/2022 10:32 AM     [facility]

## 2022-06-12 NOTE — Assessment & Plan Note (Signed)
Tylenol as needed for knee pain Still has episodes of severe hip and knee pain, had given short supply of Norco as needed for severe pain only Followed by orthopedic surgeon

## 2022-06-12 NOTE — Assessment & Plan Note (Signed)
Stable Has insomnia, takes Ambien

## 2022-06-16 NOTE — Anesthesia Preprocedure Evaluation (Signed)
Anesthesia Evaluation  Patient identified by MRN, date of birth, ID band Patient awake    Reviewed: Allergy & Precautions, H&P , NPO status , Patient's Chart, lab work & pertinent test results  History of Anesthesia Complications (+) PONV and history of anesthetic complications  Airway Mallampati: II  TM Distance: >3 FB Neck ROM: Full    Dental no notable dental hx. (+) Teeth Intact, Dental Advisory Given   Pulmonary neg pulmonary ROS, asthma    Pulmonary exam normal breath sounds clear to auscultation       Cardiovascular Exercise Tolerance: Good negative cardio ROS  Rhythm:Regular Rate:Normal     Neuro/Psych   Anxiety Depression    negative neurological ROS  negative psych ROS   GI/Hepatic negative GI ROS, Neg liver ROS,,,  Endo/Other  negative endocrine ROS    Renal/GU negative Renal ROS  negative genitourinary   Musculoskeletal  (+) Arthritis , Osteoarthritis,    Abdominal   Peds  Hematology negative hematology ROS (+)   Anesthesia Other Findings   Reproductive/Obstetrics negative OB ROS                             Anesthesia Physical Anesthesia Plan  ASA: 2  Anesthesia Plan: General   Post-op Pain Management: Tylenol PO (pre-op)* and Ketamine IV*   Induction: Intravenous  PONV Risk Score and Plan: 4 or greater and Ondansetron, Dexamethasone, Propofol infusion and Midazolam  Airway Management Planned: Oral ETT  Additional Equipment:   Intra-op Plan:   Post-operative Plan: Extubation in OR  Informed Consent: I have reviewed the patients History and Physical, chart, labs and discussed the procedure including the risks, benefits and alternatives for the proposed anesthesia with the patient or authorized representative who has indicated his/her understanding and acceptance.     Dental advisory given  Plan Discussed with: CRNA  Anesthesia Plan Comments:         Anesthesia Quick Evaluation

## 2022-06-17 ENCOUNTER — Inpatient Hospital Stay (HOSPITAL_COMMUNITY): Payer: Medicare Other

## 2022-06-17 ENCOUNTER — Other Ambulatory Visit: Payer: Self-pay

## 2022-06-17 ENCOUNTER — Inpatient Hospital Stay (HOSPITAL_COMMUNITY)
Admission: RE | Admit: 2022-06-17 | Discharge: 2022-06-18 | DRG: 472 | Disposition: A | Discharge status: Home / Self Care | Payer: Medicare Other | Attending: Neurological Surgery | Admitting: Neurological Surgery

## 2022-06-17 ENCOUNTER — Inpatient Hospital Stay (HOSPITAL_COMMUNITY): Payer: Medicare Other | Admitting: Vascular Surgery

## 2022-06-17 ENCOUNTER — Inpatient Hospital Stay (HOSPITAL_COMMUNITY): Payer: Medicare Other | Admitting: Anesthesiology

## 2022-06-17 ENCOUNTER — Encounter (HOSPITAL_COMMUNITY): Payer: Self-pay | Admitting: Neurological Surgery

## 2022-06-17 ENCOUNTER — Encounter (HOSPITAL_COMMUNITY)
Admission: RE | Disposition: A | Discharge status: Home / Self Care | Payer: Self-pay | Source: Home / Self Care | Attending: Neurological Surgery

## 2022-06-17 DIAGNOSIS — M542 Cervicalgia: Secondary | ICD-10-CM | POA: Diagnosis present

## 2022-06-17 DIAGNOSIS — M960 Pseudarthrosis after fusion or arthrodesis: Secondary | ICD-10-CM

## 2022-06-17 DIAGNOSIS — Z791 Long term (current) use of non-steroidal anti-inflammatories (NSAID): Secondary | ICD-10-CM

## 2022-06-17 DIAGNOSIS — M62838 Other muscle spasm: Secondary | ICD-10-CM | POA: Diagnosis present

## 2022-06-17 DIAGNOSIS — F419 Anxiety disorder, unspecified: Secondary | ICD-10-CM | POA: Diagnosis present

## 2022-06-17 DIAGNOSIS — F32A Depression, unspecified: Secondary | ICD-10-CM | POA: Diagnosis present

## 2022-06-17 DIAGNOSIS — Z885 Allergy status to narcotic agent status: Secondary | ICD-10-CM

## 2022-06-17 DIAGNOSIS — Z23 Encounter for immunization: Secondary | ICD-10-CM

## 2022-06-17 DIAGNOSIS — Z888 Allergy status to other drugs, medicaments and biological substances status: Secondary | ICD-10-CM

## 2022-06-17 DIAGNOSIS — Z79899 Other long term (current) drug therapy: Secondary | ICD-10-CM

## 2022-06-17 DIAGNOSIS — Y792 Prosthetic and other implants, materials and accessory orthopedic devices associated with adverse incidents: Secondary | ICD-10-CM | POA: Diagnosis present

## 2022-06-17 DIAGNOSIS — G8929 Other chronic pain: Secondary | ICD-10-CM | POA: Diagnosis present

## 2022-06-17 DIAGNOSIS — Y838 Other surgical procedures as the cause of abnormal reaction of the patient, or of later complication, without mention of misadventure at the time of the procedure: Secondary | ICD-10-CM | POA: Diagnosis present

## 2022-06-17 DIAGNOSIS — Z96642 Presence of left artificial hip joint: Secondary | ICD-10-CM | POA: Diagnosis present

## 2022-06-17 DIAGNOSIS — T84038A Mechanical loosening of other internal prosthetic joint, initial encounter: Secondary | ICD-10-CM | POA: Diagnosis present

## 2022-06-17 DIAGNOSIS — G4700 Insomnia, unspecified: Secondary | ICD-10-CM | POA: Diagnosis present

## 2022-06-17 HISTORY — PX: POSTERIOR CERVICAL FUSION/FORAMINOTOMY: SHX5038

## 2022-06-17 SURGERY — POSTERIOR CERVICAL FUSION/FORAMINOTOMY LEVEL 3
Anesthesia: General | Site: Spine Cervical

## 2022-06-17 MED ORDER — METHOCARBAMOL 500 MG PO TABS
ORAL_TABLET | ORAL | Status: AC
  Filled 2022-06-17: qty 1

## 2022-06-17 MED ORDER — FENTANYL CITRATE (PF) 250 MCG/5ML IJ SOLN
INTRAMUSCULAR | Status: AC
  Filled 2022-06-17: qty 5

## 2022-06-17 MED ORDER — ZOLPIDEM TARTRATE 5 MG PO TABS
5.0000 mg | ORAL_TABLET | Freq: Every evening | ORAL | Status: DC | PRN
  Administered 2022-06-18: 5 mg via ORAL
  Filled 2022-06-17: qty 1

## 2022-06-17 MED ORDER — HYDROCODONE-ACETAMINOPHEN 5-325 MG PO TABS
1.0000 | ORAL_TABLET | ORAL | Status: DC | PRN
  Administered 2022-06-17 – 2022-06-18 (×3): 1 via ORAL
  Administered 2022-06-18 (×2): 2 via ORAL
  Filled 2022-06-17: qty 1
  Filled 2022-06-17: qty 2
  Filled 2022-06-17 (×3): qty 1
  Filled 2022-06-17: qty 2

## 2022-06-17 MED ORDER — VANCOMYCIN HCL 1000 MG IV SOLR
INTRAVENOUS | Status: AC
  Filled 2022-06-17: qty 20

## 2022-06-17 MED ORDER — KETAMINE HCL 50 MG/5ML IJ SOSY
PREFILLED_SYRINGE | INTRAMUSCULAR | Status: AC
  Filled 2022-06-17: qty 5

## 2022-06-17 MED ORDER — SODIUM CHLORIDE 0.9 % IV SOLN
250.0000 mL | INTRAVENOUS | Status: DC
  Administered 2022-06-17: 250 mL via INTRAVENOUS

## 2022-06-17 MED ORDER — METHOCARBAMOL 1000 MG/10ML IJ SOLN: 500.0000 mg | Freq: Four times a day (QID) | INTRAVENOUS | Status: DC | PRN

## 2022-06-17 MED ORDER — SUGAMMADEX SODIUM 200 MG/2ML IV SOLN
INTRAVENOUS | Status: DC | PRN
  Administered 2022-06-17: 254 mg via INTRAVENOUS

## 2022-06-17 MED ORDER — MENTHOL 3 MG MT LOZG: 1.0000 | LOZENGE | OROMUCOSAL | Status: DC | PRN

## 2022-06-17 MED ORDER — CEFAZOLIN SODIUM-DEXTROSE 2-4 GM/100ML-% IV SOLN
2.0000 g | INTRAVENOUS | Status: AC
  Administered 2022-06-17: 2 g via INTRAVENOUS
  Filled 2022-06-17: qty 100

## 2022-06-17 MED ORDER — ORAL CARE MOUTH RINSE: 15.0000 mL | Freq: Once | OROMUCOSAL | Status: AC

## 2022-06-17 MED ORDER — FOLIC ACID 1 MG PO TABS
1.0000 mg | ORAL_TABLET | Freq: Every day | ORAL | Status: DC
  Administered 2022-06-18: 1 mg via ORAL
  Filled 2022-06-17: qty 1

## 2022-06-17 MED ORDER — LIDOCAINE-EPINEPHRINE 1 %-1:100000 IJ SOLN
INTRAMUSCULAR | Status: AC
  Filled 2022-06-17: qty 1

## 2022-06-17 MED ORDER — FENTANYL CITRATE (PF) 100 MCG/2ML IJ SOLN
25.0000 ug | INTRAMUSCULAR | Status: DC | PRN
  Administered 2022-06-17 (×2): 25 ug via INTRAVENOUS

## 2022-06-17 MED ORDER — ONDANSETRON HCL 4 MG/2ML IJ SOLN
INTRAMUSCULAR | Status: DC | PRN
  Administered 2022-06-17: 4 mg via INTRAVENOUS

## 2022-06-17 MED ORDER — MIDAZOLAM HCL 2 MG/2ML IJ SOLN
INTRAMUSCULAR | Status: DC | PRN
  Administered 2022-06-17: 2 mg via INTRAVENOUS

## 2022-06-17 MED ORDER — ALBUTEROL SULFATE (2.5 MG/3ML) 0.083% IN NEBU: 2.5000 mg | INHALATION_SOLUTION | RESPIRATORY_TRACT | Status: DC | PRN

## 2022-06-17 MED ORDER — THROMBIN 5000 UNITS EX SOLR
CUTANEOUS | Status: AC
  Filled 2022-06-17: qty 5000

## 2022-06-17 MED ORDER — PROPOFOL 10 MG/ML IV BOLUS
INTRAVENOUS | Status: DC | PRN
  Administered 2022-06-17: 150 mg via INTRAVENOUS

## 2022-06-17 MED ORDER — FENTANYL CITRATE (PF) 100 MCG/2ML IJ SOLN: 25.0000 ug | Freq: Once | INTRAMUSCULAR | Status: DC

## 2022-06-17 MED ORDER — CHLORHEXIDINE GLUCONATE CLOTH 2 % EX PADS: 6.0000 | MEDICATED_PAD | Freq: Once | CUTANEOUS | Status: DC

## 2022-06-17 MED ORDER — LACTATED RINGERS IV SOLN: INTRAVENOUS | Status: DC

## 2022-06-17 MED ORDER — KETAMINE HCL-SODIUM CHLORIDE 100-0.9 MG/10ML-% IV SOSY
PREFILLED_SYRINGE | INTRAVENOUS | Status: DC | PRN
  Administered 2022-06-17: 10 mg via INTRAVENOUS
  Administered 2022-06-17: 30 mg via INTRAVENOUS

## 2022-06-17 MED ORDER — DOCUSATE SODIUM 100 MG PO CAPS
100.0000 mg | ORAL_CAPSULE | Freq: Two times a day (BID) | ORAL | Status: DC
  Administered 2022-06-17 – 2022-06-18 (×2): 100 mg via ORAL
  Filled 2022-06-17 (×2): qty 1

## 2022-06-17 MED ORDER — BUPIVACAINE LIPOSOME 1.3 % IJ SUSP
INTRAMUSCULAR | Status: AC
  Filled 2022-06-17: qty 20

## 2022-06-17 MED ORDER — PHENOL 1.4 % MT LIQD: 1.0000 | OROMUCOSAL | Status: DC | PRN

## 2022-06-17 MED ORDER — THROMBIN 20000 UNITS EX SOLR
CUTANEOUS | Status: AC
  Filled 2022-06-17: qty 20000

## 2022-06-17 MED ORDER — CEFAZOLIN SODIUM-DEXTROSE 2-4 GM/100ML-% IV SOLN
2.0000 g | Freq: Three times a day (TID) | INTRAVENOUS | Status: AC
  Administered 2022-06-17 – 2022-06-18 (×2): 2 g via INTRAVENOUS
  Filled 2022-06-17 (×3): qty 100

## 2022-06-17 MED ORDER — ONDANSETRON HCL 4 MG/2ML IJ SOLN: 4.0000 mg | Freq: Four times a day (QID) | INTRAMUSCULAR | Status: DC | PRN

## 2022-06-17 MED ORDER — BACITRACIN ZINC 500 UNIT/GM EX OINT
TOPICAL_OINTMENT | CUTANEOUS | Status: AC
  Filled 2022-06-17: qty 28.35

## 2022-06-17 MED ORDER — MIDAZOLAM HCL 2 MG/2ML IJ SOLN
INTRAMUSCULAR | Status: AC
  Filled 2022-06-17: qty 2

## 2022-06-17 MED ORDER — INFLUENZA VAC A&B SA ADJ QUAD 0.5 ML IM PRSY
0.5000 mL | PREFILLED_SYRINGE | INTRAMUSCULAR | Status: AC
  Administered 2022-06-18: 0.5 mL via INTRAMUSCULAR
  Filled 2022-06-17: qty 0.5

## 2022-06-17 MED ORDER — OXYCODONE HCL 5 MG PO TABS
10.0000 mg | ORAL_TABLET | ORAL | Status: DC | PRN
  Administered 2022-06-17: 10 mg via ORAL

## 2022-06-17 MED ORDER — LACTATED RINGERS IV SOLN: INTRAVENOUS | Status: DC | PRN

## 2022-06-17 MED ORDER — MORPHINE SULFATE (PF) 2 MG/ML IV SOLN
2.0000 mg | INTRAVENOUS | Status: DC | PRN
  Administered 2022-06-17: 2 mg via INTRAVENOUS
  Filled 2022-06-17: qty 1

## 2022-06-17 MED ORDER — SODIUM CHLORIDE 0.9% FLUSH
3.0000 mL | Freq: Two times a day (BID) | INTRAVENOUS | Status: DC
  Administered 2022-06-17: 3 mL via INTRAVENOUS

## 2022-06-17 MED ORDER — ROCURONIUM BROMIDE 10 MG/ML (PF) SYRINGE
PREFILLED_SYRINGE | INTRAVENOUS | Status: DC | PRN
  Administered 2022-06-17: 100 mg via INTRAVENOUS
  Administered 2022-06-17: 50 mg via INTRAVENOUS

## 2022-06-17 MED ORDER — 0.9 % SODIUM CHLORIDE (POUR BTL) OPTIME
TOPICAL | Status: DC | PRN
  Administered 2022-06-17: 1000 mL

## 2022-06-17 MED ORDER — THROMBIN 5000 UNITS EX SOLR: OROMUCOSAL | Status: DC | PRN

## 2022-06-17 MED ORDER — FENTANYL CITRATE (PF) 250 MCG/5ML IJ SOLN
INTRAMUSCULAR | Status: DC | PRN
  Administered 2022-06-17: 150 ug via INTRAVENOUS

## 2022-06-17 MED ORDER — LIDOCAINE 2% (20 MG/ML) 5 ML SYRINGE
INTRAMUSCULAR | Status: DC | PRN
  Administered 2022-06-17: 60 mg via INTRAVENOUS

## 2022-06-17 MED ORDER — ONDANSETRON HCL 4 MG PO TABS
4.0000 mg | ORAL_TABLET | Freq: Four times a day (QID) | ORAL | Status: DC | PRN
  Filled 2022-06-17: qty 1

## 2022-06-17 MED ORDER — DEXAMETHASONE SODIUM PHOSPHATE 10 MG/ML IJ SOLN
INTRAMUSCULAR | Status: DC | PRN
  Administered 2022-06-17: 10 mg via INTRAVENOUS

## 2022-06-17 MED ORDER — GABAPENTIN 100 MG PO CAPS
200.0000 mg | ORAL_CAPSULE | Freq: Three times a day (TID) | ORAL | Status: DC
  Administered 2022-06-17 – 2022-06-18 (×3): 200 mg via ORAL
  Filled 2022-06-17 (×3): qty 2

## 2022-06-17 MED ORDER — ACETAMINOPHEN 650 MG RE SUPP: 650.0000 mg | RECTAL | Status: DC | PRN

## 2022-06-17 MED ORDER — SODIUM CHLORIDE 0.9% FLUSH: 3.0000 mL | INTRAVENOUS | Status: DC | PRN

## 2022-06-17 MED ORDER — BUPIVACAINE-EPINEPHRINE (PF) 0.5% -1:200000 IJ SOLN
INTRAMUSCULAR | Status: DC | PRN
  Administered 2022-06-17: 9 mL via PERINEURAL

## 2022-06-17 MED ORDER — PROPOFOL 500 MG/50ML IV EMUL
INTRAVENOUS | Status: DC | PRN
  Administered 2022-06-17: 75 ug/kg/min via INTRAVENOUS

## 2022-06-17 MED ORDER — OXYCODONE HCL 5 MG PO TABS
ORAL_TABLET | ORAL | Status: AC
  Filled 2022-06-17: qty 2

## 2022-06-17 MED ORDER — THROMBIN 20000 UNITS EX SOLR: CUTANEOUS | Status: DC | PRN

## 2022-06-17 MED ORDER — DULOXETINE HCL 20 MG PO CPEP
20.0000 mg | ORAL_CAPSULE | Freq: Every day | ORAL | Status: DC
  Administered 2022-06-18: 20 mg via ORAL
  Filled 2022-06-17: qty 1

## 2022-06-17 MED ORDER — BACITRACIN 500 UNIT/GM EX OINT
TOPICAL_OINTMENT | CUTANEOUS | Status: DC | PRN
  Administered 2022-06-17: 1 via TOPICAL

## 2022-06-17 MED ORDER — METHOCARBAMOL 500 MG PO TABS
500.0000 mg | ORAL_TABLET | Freq: Four times a day (QID) | ORAL | Status: DC | PRN
  Administered 2022-06-17 – 2022-06-18 (×3): 500 mg via ORAL
  Filled 2022-06-17 (×2): qty 1

## 2022-06-17 MED ORDER — ACETAMINOPHEN 500 MG PO TABS
1000.0000 mg | ORAL_TABLET | Freq: Once | ORAL | Status: AC
  Administered 2022-06-17: 1000 mg via ORAL
  Filled 2022-06-17: qty 2

## 2022-06-17 MED ORDER — BUPIVACAINE-EPINEPHRINE (PF) 0.5% -1:200000 IJ SOLN
INTRAMUSCULAR | Status: AC
  Filled 2022-06-17: qty 30

## 2022-06-17 MED ORDER — PHENYLEPHRINE HCL-NACL 20-0.9 MG/250ML-% IV SOLN
INTRAVENOUS | Status: DC | PRN
  Administered 2022-06-17: 20 ug/min via INTRAVENOUS

## 2022-06-17 MED ORDER — ACETAMINOPHEN 325 MG PO TABS: 650.0000 mg | ORAL_TABLET | ORAL | Status: DC | PRN

## 2022-06-17 MED ORDER — FENTANYL CITRATE (PF) 100 MCG/2ML IJ SOLN
INTRAMUSCULAR | Status: AC
  Administered 2022-06-17: 25 ug
  Filled 2022-06-17: qty 2

## 2022-06-17 MED ORDER — BUPIVACAINE LIPOSOME 1.3 % IJ SUSP
INTRAMUSCULAR | Status: DC | PRN
  Administered 2022-06-17: 20 mL

## 2022-06-17 MED ORDER — LIDOCAINE-EPINEPHRINE 1 %-1:100000 IJ SOLN
INTRAMUSCULAR | Status: DC | PRN
  Administered 2022-06-17: 9 mL

## 2022-06-17 MED ORDER — CHLORHEXIDINE GLUCONATE 0.12 % MT SOLN
15.0000 mL | Freq: Once | OROMUCOSAL | Status: AC
  Administered 2022-06-17: 15 mL via OROMUCOSAL
  Filled 2022-06-17: qty 15

## 2022-06-17 MED ORDER — FENTANYL CITRATE (PF) 100 MCG/2ML IJ SOLN
INTRAMUSCULAR | Status: AC
  Filled 2022-06-17: qty 2

## 2022-06-17 MED ORDER — OXYCODONE HCL 5 MG PO TABS: 5.0000 mg | ORAL_TABLET | ORAL | Status: DC | PRN

## 2022-06-17 SURGICAL SUPPLY — 74 items
ADH SKN CLS APL DERMABOND .7 (GAUZE/BANDAGES/DRESSINGS) ×1
BAG COUNTER SPONGE SURGICOUNT (BAG) ×2 IMPLANT
BAG SPNG CNTER NS LX DISP (BAG) ×1
BAND INSRT 18 STRL LF DISP RB (MISCELLANEOUS) ×2
BAND RUBBER #18 3X1/16 STRL (MISCELLANEOUS) ×4 IMPLANT
BIT DRILL NEURO 2X3.1 SFT TUCH (MISCELLANEOUS) ×2 IMPLANT
BNDG GAUZE DERMACEA FLUFF 4 (GAUZE/BANDAGES/DRESSINGS) ×2 IMPLANT
BNDG GZE DERMACEA 4 6PLY (GAUZE/BANDAGES/DRESSINGS) ×1
CNTNR URN SCR LID CUP LEK RST (MISCELLANEOUS) ×2 IMPLANT
CONT SPEC 4OZ STRL OR WHT (MISCELLANEOUS) ×1
COVER BACK TABLE 60X90IN (DRAPES) ×2 IMPLANT
COVER MAYO STAND STRL (DRAPES) ×2 IMPLANT
DERMABOND ADVANCED .7 DNX12 (GAUZE/BANDAGES/DRESSINGS) ×2 IMPLANT
DRAIN JACKSON RD 7FR 3/32 (WOUND CARE) IMPLANT
DRAPE C-ARM 42X72 X-RAY (DRAPES) ×2 IMPLANT
DRAPE LAPAROTOMY 100X72X124 (DRAPES) ×2 IMPLANT
DRAPE MICROSCOPE SLANT 54X150 (MISCELLANEOUS) ×2 IMPLANT
DRAPE SURG 17X23 STRL (DRAPES) ×2 IMPLANT
DRILL NEURO 2X3.1 SOFT TOUCH (MISCELLANEOUS) ×1
DRSG OPSITE POSTOP 4X6 (GAUZE/BANDAGES/DRESSINGS) ×2 IMPLANT
DRSG OPSITE POSTOP 4X8 (GAUZE/BANDAGES/DRESSINGS) IMPLANT
DURAPREP 26ML APPLICATOR (WOUND CARE) ×2 IMPLANT
ELECT BLADE INSULATED 4IN (ELECTROSURGICAL)
ELECT BLADE INSULATED 6.5IN (ELECTROSURGICAL)
ELECT REM PT RETURN 9FT ADLT (ELECTROSURGICAL) ×1
ELECTRODE BLADE INSULATED 4IN (ELECTROSURGICAL) IMPLANT
ELECTRODE BLDE INSULATED 6.5IN (ELECTROSURGICAL) IMPLANT
ELECTRODE REM PT RTRN 9FT ADLT (ELECTROSURGICAL) ×2 IMPLANT
GAUZE 4X4 16PLY ~~LOC~~+RFID DBL (SPONGE) IMPLANT
GAUZE SPONGE 4X4 12PLY STRL (GAUZE/BANDAGES/DRESSINGS) ×2 IMPLANT
GLOVE BIOGEL PI IND STRL 8 (GLOVE) ×2 IMPLANT
GLOVE ECLIPSE 8.0 STRL XLNG CF (GLOVE) ×2 IMPLANT
GLOVE SURG ENC MOIS LTX SZ8 (GLOVE) ×4 IMPLANT
GLOVE SURG UNDER POLY LF SZ8.5 (GLOVE) ×4 IMPLANT
GOWN STRL REUS W/ TWL LRG LVL3 (GOWN DISPOSABLE) IMPLANT
GOWN STRL REUS W/ TWL XL LVL3 (GOWN DISPOSABLE) ×4 IMPLANT
GOWN STRL REUS W/TWL 2XL LVL3 (GOWN DISPOSABLE) IMPLANT
GOWN STRL REUS W/TWL LRG LVL3 (GOWN DISPOSABLE) ×2
GOWN STRL REUS W/TWL XL LVL3 (GOWN DISPOSABLE) ×2
HEMOSTAT POWDER KIT SURGIFOAM (HEMOSTASIS) ×2 IMPLANT
KIT BASIN OR (CUSTOM PROCEDURE TRAY) ×2 IMPLANT
KIT TURNOVER KIT B (KITS) ×2 IMPLANT
MARKER SKIN DUAL TIP RULER LAB (MISCELLANEOUS) ×2 IMPLANT
NDL BLUNT 18X1 FOR OR ONLY (NEEDLE) ×2 IMPLANT
NDL HYPO 25X1 1.5 SAFETY (NEEDLE) ×2 IMPLANT
NDL SPNL 18GX3.5 QUINCKE PK (NEEDLE) ×4 IMPLANT
NEEDLE BLUNT 18X1 FOR OR ONLY (NEEDLE) ×1 IMPLANT
NEEDLE HYPO 25X1 1.5 SAFETY (NEEDLE) ×1 IMPLANT
NEEDLE SPNL 18GX3.5 QUINCKE PK (NEEDLE) ×2 IMPLANT
NS IRRIG 1000ML POUR BTL (IV SOLUTION) ×2 IMPLANT
PACK LAMINECTOMY NEURO (CUSTOM PROCEDURE TRAY) ×2 IMPLANT
PAD ARMBOARD 7.5X6 YLW CONV (MISCELLANEOUS) ×6 IMPLANT
PUTTY BONE 100 VESUVIUS 2.5CC (Putty) IMPLANT
ROD PED STRT TI 3.5X120 (Rod) ×2 IMPLANT
ROD STRT CASP 3.5X120 NS (Rod) IMPLANT
SCREW PA YUKON 3.5X12 (Screw) IMPLANT
SCREW PA YUKON 4X18 (Screw) IMPLANT
SCREW SET SPINAL YUKON (Set) IMPLANT
SCREW SET THREADED (Screw) IMPLANT
SPIKE FLUID TRANSFER (MISCELLANEOUS) ×2 IMPLANT
SPONGE SURGIFOAM ABS GEL 100 (HEMOSTASIS) ×2 IMPLANT
SPONGE T-LAP 4X18 ~~LOC~~+RFID (SPONGE) IMPLANT
STAPLER VISISTAT 35W (STAPLE) IMPLANT
SUT VIC AB 1 CT1 18XBRD ANBCTR (SUTURE) IMPLANT
SUT VIC AB 1 CT1 8-18 (SUTURE) ×2
SUT VIC AB 2-0 CP2 18 (SUTURE) ×2 IMPLANT
SUT VIC AB 3-0 SH 8-18 (SUTURE) ×2 IMPLANT
SYR 3ML LL SCALE MARK (SYRINGE) ×2 IMPLANT
TAP YUKON 3.0 DISP (TAP) IMPLANT
TAP YUKON 3.5 DISP (TAP) IMPLANT
TOWEL GREEN STERILE (TOWEL DISPOSABLE) ×2 IMPLANT
TOWEL GREEN STERILE FF (TOWEL DISPOSABLE) ×2 IMPLANT
TRAY FOLEY MTR SLVR 16FR STAT (SET/KITS/TRAYS/PACK) ×2 IMPLANT
WATER STERILE IRR 1000ML POUR (IV SOLUTION) ×2 IMPLANT

## 2022-06-17 NOTE — Progress Notes (Signed)
Orthopedic Tech Progress Note Patient Details:  Stephanie Noble [birth year] [id number]  PACU RN called requesting an Luna [place] , so I called [facility]   Patient ID: Stephanie Noble, female   DOB: 1955-12-15, 66 y.o.   MRN: [id number]  [name] 06/17/2022, 3:42 PM

## 2022-06-17 NOTE — Anesthesia Postprocedure Evaluation (Signed)
Anesthesia Post Note  Patient: Stephanie Noble  Procedure(s) Performed: CERVICAL TWO-THREE, CERVICAL THREE-FOUR, CERVICAL FOUR-FIVE POSTERIOR CERVICAL INSTRUMENTATION AND FUSION WITH EXTENSION TO CERVICAL SIX-THORACIC ONE FUSION (Spine Cervical)     Patient location during evaluation: PACU Anesthesia Type: General Level of consciousness: awake and alert Pain management: pain level controlled Vital Signs Assessment: post-procedure vital signs reviewed and stable Respiratory status: spontaneous breathing, nonlabored ventilation, respiratory function stable and patient connected to nasal cannula oxygen Cardiovascular status: blood pressure returned to baseline and stable Postop Assessment: no apparent nausea or vomiting Anesthetic complications: no  No notable events documented.  Last Vitals:  Vitals:   06/17/22 1515 06/17/22 1530  BP: (!) 136/91 131/76  Pulse: 73 73  Resp: 19 14  Temp:    SpO2: 100% 100%    Last Pain:  Vitals:   06/17/22 1515  TempSrc:   PainSc: 7                  Ranay Ketter,W. EDMOND

## 2022-06-17 NOTE — Anesthesia Procedure Notes (Signed)
Procedure Name: Intubation Date/Time: 06/17/2022 11:55 AM  Performed by: Minerva Ends, CRNAPre-anesthesia Checklist: Patient identified, Emergency Drugs available, Suction available and Patient being monitored Patient Re-evaluated:Patient Re-evaluated prior to induction Oxygen Delivery Method: Circle system utilized Preoxygenation: Pre-oxygenation with 100% oxygen Induction Type: IV induction Ventilation: Mask ventilation without difficulty Laryngoscope Size: Mac and 3 Grade View: Grade I Tube type: Oral Tube size: 7.0 mm Number of attempts: 1 Airway Equipment and Method: Stylet and Oral airway Placement Confirmation: ETT inserted through vocal cords under direct vision, positive ETCO2 and breath sounds checked- equal and bilateral Secured at: 22 cm Tube secured with: Tape Dental Injury: Teeth and Oropharynx as per pre-operative assessment

## 2022-06-17 NOTE — Op Note (Signed)
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 Kendell Bane C. Giankarlo Leamer, DO  ASSISTANT: Dr. Barnett Abu, MD  ANESTHESIA: General Endotracheal  EBL: 100 cc  SPECIMENS: None  DRAINS: None  COMPLICATIONS: None  CONDITION: Hemodynamically stable  HISTORY: Stephanie Noble is a 66 y.o. female with a history of most recently a C3-4 ACDF with removal of the C4-5 portion of the plate from a prior ACDF in March 2023.  Initially postoperatively she did very well, then began having worsening neck pain and on x-rays she was found to have what appeared to be a nonunion at C4-5 with progressive collapse and now abutment of the C3-4 and C5-7 plate as well as lucency along the C4 screws bilaterally.  She also has a history of a posterior cervical instrumentation and fusion C6-T1 for nonunion.,  Given this and her progressive worsening neck pain, I recommended surgical intervention in the form of a C2-5 posterior cervical instrumentation and fusion with exploration of fusion at C6-T1.  All risks benefits and expected outcomes were discussed and agreed upon.  Informed consent was obtained and witnessed.  PROCEDURE IN DETAIL: The patient was brought to the operating room. After induction of general anesthesia, head holder was placed and the patient was positioned on the operative table in the prone position. All pressure points were meticulously padded. Skin incision was then marked out and prepped and  draped in the usual sterile fashion. Physician driven timeout was performed.  Local anesthetic was injected into the incision.  Using a 10 blade, midline incision was made over the C2-T1 spinous processes.  Using Bovie electrocautery midline dissection was performed down to the dorsal fascia.  Using subperiosteal technique, bilateral dissection was performed with Bovie electrocautery to expose the C2, C3, C4, C5 lamina and facets bilaterally.  I then was able to identify using Bovie electrocautery the prior hardware at C6-T1 bilaterally.  This was NuVasive.  The fusion mass appeared appropriate at C6-7 and C7-T1 bilaterally.  Using a high-speed drill, pilot hole was created inferior laterally to the C2 pars and using a superior medial trajectory along the pars, a 3.5 mm tap was used to a depth of 18 mm.  Using ball-tipped probe, there were bony prominences in all directions without any breach and there was a bony floor.  The above listed screws were then placed with appropriate bony purchase.  Lateral fluoroscopy confirmed appropriate placement as well.  Due to her anatomy, the C3 lateral masses were noted to be extremely small and therefore pilot holes were created in the bilateral C4 lateral masses and a superior lateral trajectory.  A 3.0 mm tap was used to a depth of 12 mm.  Ball-tipped probe confirmed bony prominences in all directions and a bony floor without breach.  The above listed lateral mass screws were placed with appropriate bony purchase.  Given the contour of her neck, I did not feel it was appropriate to place instrumentation at C5.  Therefore the prior set screws and rods at C6-T1 were removed without difficulty.  Then decorticated the C2-3,  C3-4, C4-5, C5-6 joints bilaterally.  Autograft was saved and used for fusion as well as the spinous processes of C5 and C6 and C7.  I then placed a mixture of autograft and allograft in the lateral gutters bilaterally.  Appropriate size rods were then  measured cut and contoured and placed bilaterally.  Setscrews were placed appropriately and final tightened to the manufacturer's recommendation.  Lateral fluoroscopy confirmed appropriate placement.  Deep retractors were taken out of the wound.  Soft tissue hemostasis was achieved with bipolar cautery.  Long-acting anesthetic was placed in the muscle and soft tissue.  The wound was closed in layers at this point with 0 Vicryl sutures for muscle and fascia.  Dermis was closed with 2-0 and 3-0 Vicryl sutures.  Skin was closed with skin glue.  Sterile dressing was applied.  At the end of the case all sponge, needle, and instrument counts were correct. The patient was then transferred to the stretcher, extubated, and taken to the post-anesthesia care unit in stable hemodynamic condition.

## 2022-06-17 NOTE — H&P (Signed)
Providing Compassionate, Quality Care - Together  NEUROSURGERY HISTORY & PHYSICAL   Stephanie Noble is an 66 y.o. female.   Chief Complaint: Cervicalgia HPI: This is a pleasant 66 year old female with a history of multiple cervical fusions, most recently C3-4 ACDF, with removal of partial plate at A8-3, with continued neck pain has been worsening.  X-rays and CT scan revealed progressive nonunion at C4-5 with loosening of the C4 screws bilaterally.  Given this, I recommended surgical intervention in the form of posterior cervical instrumentation and fusion C2-5, with connection to prior C6-T1 hardware.  She presents today for surgical intervention.  Past Medical History:  Diagnosis Date   Anxiety    Asthma    uses albuterol as needed "mostly weather induced".ALbuterol and Ventolin inhaler as needed   Chronic back pain    Chronic neck pain    Complication of anesthesia    post-op PEA arrest 04/16/16, thought due to respriatory arrest from sedation, narcotic, hypovolemia   Degenerative disc disease    cervical and lumbar area   Depression    History of bronchitis 2016   History of cervical spinal surgery 12/30/2019   History of lumbar surgery 12/30/2019   Insomnia    takes Ambien nightly   Muscle spasm    takes Robaxin daily    Pneumonia    hx of-2016   PONV (postoperative nausea and vomiting)    Weakness    numbness and tingling in legs    Past Surgical History:  Procedure Laterality Date   ANTERIOR CERVICAL DECOMP/DISCECTOMY FUSION N/A 09/05/2021   Procedure: CERVICAL THREE-FOUR ANTERIOR CERVICAL DECOMPRESSION/DISCECTOMY FUSION WITH PARTIAL PLATE REMOVAL AT CERVICAL FOUR-FIVE;  Surgeon: Bethann Goo, DO;  Location: [facility] OR;  Service: Neurosurgery;  Laterality: N/A;  3C   BACK SURGERY     x 3 lumbar   CHOLECYSTECTOMY     DILATION AND CURETTAGE OF UTERUS     x 2   JOINT REPLACEMENT     KNEE SURGERY Left    x 2   left knee arthroscopy      x 2   POSTERIOR CERVICAL  FUSION/FORAMINOTOMY  05/17/2012   Procedure: POSTERIOR CERVICAL FUSION/FORAMINOTOMY LEVEL 2;  Surgeon: Hewitt Shorts, MD;  Location: [facility] OR[facility];  Service: Neurosurgery;  Laterality: N/A;  cervical six to Thoracic One posterior cervical arthrodesis with instrumentation   SHOULDER ARTHROSCOPY Left 10/27/2019   Procedure: left shoulder arthroscopy with arthroscopic vs open distal clavical excision;  Surgeon: Cammy Copa, MD;  Location: [facility] OR;  Service: Orthopedics;  Laterality: Left;   SHOULDER SURGERY     right   SPINE SURGERY     x 5   TOE FUSION Left 09/2016   TOTAL HIP ARTHROPLASTY Left 11/09/2018   Procedure: LEFT TOTAL HIP ARTHROPLASTY DIRECT ANTERIOR APPROACH;  Surgeon: Cammy Copa, MD;  Location: [facility] ORS;  Service: Orthopedics;  Laterality: Left;    Family History  Problem Relation Age of Onset   Dementia Mother    Other Sister    Breast cancer Sister    Alcohol abuse Other    Breast cancer Paternal Aunt    Social History:  reports that she has never smoked. She has never used smokeless tobacco. She reports that she does not drink alcohol and does not use drugs.  Allergies:  Allergies  Allergen Reactions   Vistaril [Hydroxyzine Hcl] Other (See Comments)    Pt went into cardiac arrest    Hydromorphone Hcl Hives  Per report from PACU RN, pt had itching on arrival to pacu then developed hives after dilaudid given. Discussed with Dr. Jake Samples and dilaudid allergy being added to record    Dilaudid [Hydromorphone] Rash    Medications Prior to Admission  Medication Sig Dispense Refill   albuterol (PROVENTIL) (2.5 MG/3ML) 0.083% nebulizer solution Take 3 mLs (2.5 mg total) by nebulization every 4 (four) hours as needed for wheezing or shortness of breath. 150 mL 5   albuterol (VENTOLIN HFA) 108 (90 Base) MCG/ACT inhaler Inhale 2 sprays QID prn wheezing (Patient taking differently: Inhale 2 puffs into the lungs 4 (four) times daily as needed for wheezing or  shortness of breath.) 18 g 0   DULoxetine (CYMBALTA) 20 MG capsule Take 1 capsule (20 mg total) by mouth daily. 90 capsule 1   folic acid (FOLVITE) 1 MG tablet Take 1 tablet (1 mg total) by mouth daily. 90 tablet 3   gabapentin (NEURONTIN) 300 MG capsule TAKE 2 CAPSULE IN THE MORNING , 2 AT LUNCH AND 3 AT BEDTIME 180 capsule 2   HYDROcodone-acetaminophen (NORCO/VICODIN) 5-325 MG tablet Take 1 tablet by mouth every 6 (six) hours as needed for moderate pain or severe pain. 20 tablet 0   meloxicam (MOBIC) 7.5 MG tablet TAKE 1 TABLET BY MOUTH EVERY DAY 30 tablet 1   methocarbamol (ROBAXIN) 750 MG tablet TAKE 1 TABLET BY MOUTH THREE TIMES A DAY AS NEEDED (Patient taking differently: Take 750 mg by mouth 3 (three) times daily.) 90 tablet 2   Omega-3 Fatty Acids (FISH OIL) 1200 MG CAPS Take 1,200 mg by mouth 2 (two) times a week.     Propylene Glycol (SYSTANE BALANCE) 0.6 % SOLN Place 1 drop into both eyes 2 (two) times daily as needed (dry eyes).     mupirocin ointment (BACTROBAN) 2 % Apply topically.     zolpidem (AMBIEN) 10 MG tablet Take 1 tablet (10 mg total) by mouth at bedtime as needed for sleep. 30 tablet 3    No results found for this or any previous visit (from the past 48 hour(s)). No results found.  ROS All pertinent positives and negatives are listed in HPI above  Blood pressure 122/78, pulse 62, temperature 97.7 F (36.5 C), temperature source Oral, resp. rate 16, height 5\' 5"  (1.651 m), weight 63.5 kg, SpO2 100 %. 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  I explained to her that we need to extend the hardware up to C2 as her lateral mass sizes on her CT scan at C3 are very small and therefore unlikely to hold significant instrumentation.  All risks benefits and expected outcomes were discussed and agreed upon.  Informed consent was obtained and witnessed.   Thank you for allowing me to participate in this patient's care.  Please do not hesitate to call with questions or concerns.   71, DO Neurosurgeon [facility] Cell: [phone]

## 2022-06-17 NOTE — Transfer of Care (Signed)
Immediate Anesthesia Transfer of Care Note  Patient: Stephanie Noble  Procedure(s) Performed: CERVICAL TWO-THREE, CERVICAL THREE-FOUR, CERVICAL FOUR-FIVE POSTERIOR CERVICAL INSTRUMENTATION AND FUSION WITH EXTENSION TO CERVICAL SIX-THORACIC ONE FUSION (Spine Cervical)  Patient Location: PACU  Anesthesia Type:General  Level of Consciousness: sedated  Airway & Oxygen Therapy: Patient Spontanous Breathing and Patient connected to face mask oxygen  Post-op Assessment: Report given to RN and Post -op Vital signs reviewed and stable  Post vital signs: Reviewed and stable  Last Vitals:  Vitals Value Taken Time  BP 138/88 06/17/22 1440  Temp 98   Pulse 82 06/17/22 1442  Resp 15 06/17/22 1442  SpO2 100 % 06/17/22 1442  Vitals shown include unvalidated device data.  Last Pain:  Vitals:   06/17/22 1037  TempSrc:   PainSc: 9          Complications: No notable events documented.

## 2022-06-18 ENCOUNTER — Encounter (HOSPITAL_COMMUNITY): Payer: Self-pay | Admitting: Neurological Surgery

## 2022-06-18 MED ORDER — OXYCODONE HCL 5 MG PO TABS: 10.0000 mg | ORAL_TABLET | Freq: Four times a day (QID) | ORAL | 0 refills | Status: DC | PRN

## 2022-06-18 NOTE — Progress Notes (Signed)
Discharge instructions reviewed with pt.  Copy of instructions given to pt. Pt informed script for pain med sent to her pharmacy for pick up.  Pt to be d/c'd via wheelchair with belongings to discharge lounge, pt's husband on his way and will be here shortly.            Escorted by staff.

## 2022-06-18 NOTE — Progress Notes (Signed)
Pt discharged by Annice Needy, SWOT RN. See nurse's note for details.

## 2022-06-18 NOTE — Discharge Summary (Signed)
Physician Discharge Summary  Patient ID: Stephanie Noble MRN: [id number] DOB/AGE: 1956-03-22 66 y.o.  Admit date: 06/17/2022 Discharge date: 06/18/2022  Admission Diagnoses:  C4-5 nonunion, cervicalgia  Discharge Diagnoses:  Same Principal Problem:   Pseudarthrosis after fusion or arthrodesis   Discharged Condition: Stable  Hospital Course:  Stephanie Noble is a 66 y.o. female underwent an elective posterior C2-5 instrumentation and fusion, exploration of fusion C6-T1.  She tolerated surgery well, postoperatively her pain was controlled.  She had a normal hospitalization course.  She was ambulating independently, having normal bowel bladder function.  She was tolerating normal diet.  Her wound was clean dry and intact.  Treatments: Surgery -posterior cervical instrumentation and fusion C2-5, exploration of fusion C6-T1  Discharge Exam: Blood pressure 119/74, pulse 61, temperature 98 F (36.7 C), temperature source Oral, resp. rate 13, height 5\' 5"  (1.651 m), weight 63.5 kg, SpO2 100 %. Awake, alert, oriented Speech fluent, appropriate CN grossly intact 5/5 BUE/BLE Wound c/d/I Cranial nerves II through XII intact  Disposition: Discharge disposition: 01-Home or Self Care       Discharge Instructions     Incentive spirometry RT   Complete by: As directed       Allergies as of 06/18/2022       Reactions   Vistaril [hydroxyzine Hcl] Other (See Comments)   Pt went into cardiac arrest    Hydromorphone Hcl Hives   Per report from PACU RN, pt had itching on arrival to pacu then developed hives after dilaudid given. Discussed with Dr. 06/20/2022 and dilaudid allergy being added to record    Dilaudid [hydromorphone] Rash        Medication List     STOP taking these medications    HYDROcodone-acetaminophen 5-325 MG tablet Commonly known as: NORCO/VICODIN   meloxicam 7.5 MG tablet Commonly known as: MOBIC       TAKE these medications    albuterol (2.5  MG/3ML) 0.083% nebulizer solution Commonly known as: PROVENTIL Take 3 mLs (2.5 mg total) by nebulization every 4 (four) hours as needed for wheezing or shortness of breath. What changed: Another medication with the same name was changed. Make sure you understand how and when to take each.   albuterol 108 (90 Base) MCG/ACT inhaler Commonly known as: VENTOLIN HFA Inhale 2 sprays QID prn wheezing What changed:  how much to take how to take this when to take this reasons to take this additional instructions   DULoxetine 20 MG capsule Commonly known as: CYMBALTA Take 1 capsule (20 mg total) by mouth daily.   Fish Oil 1200 MG Caps Take 1,200 mg by mouth 2 (two) times a week.   folic acid 1 MG tablet Commonly known as: FOLVITE Take 1 tablet (1 mg total) by mouth daily.   gabapentin 300 MG capsule Commonly known as: NEURONTIN TAKE 2 CAPSULE IN THE MORNING , 2 AT LUNCH AND 3 AT BEDTIME   methocarbamol 750 MG tablet Commonly known as: ROBAXIN TAKE 1 TABLET BY MOUTH THREE TIMES A DAY AS NEEDED What changed: when to take this   mupirocin ointment 2 % Commonly known as: BACTROBAN Apply topically.   oxyCODONE 5 MG immediate release tablet Commonly known as: Roxicodone Take 2 tablets (10 mg total) by mouth every 6 (six) hours as needed. Can take 1-2 tablets   Systane Balance 0.6 % Soln Generic drug: Propylene Glycol Place 1 drop into both eyes 2 (two) times daily as needed (dry eyes).   zolpidem 10 MG tablet  Commonly known as: AMBIEN Take 1 tablet (10 mg total) by mouth at bedtime as needed for sleep.        Follow-up Information     Chandrea Zellman C, DO Follow up in 3 week(s).   Contact information: [address] [place] [address] [phone]                 Signed: Alan Mulder Jon Lall 06/18/2022, 9:38 AM

## 2022-06-19 ENCOUNTER — Telehealth: Payer: Self-pay | Admitting: *Deleted

## 2022-06-19 NOTE — Patient Outreach (Signed)
  Care Coordination [facility] Note Transition Care Management Unsuccessful Follow-up Telephone Call  Date of discharge and from where:  [facility] on [facility]/18/22  Attempts:  1st Attempt  Reason for unsuccessful TCM follow-up call:  No answer/busy  Demetrios Loll, BSN, RN-BC RN Care Coordinator [facility]  [facility] Direct Dial: [phone] Main #: [phone]

## 2022-06-25 ENCOUNTER — Telehealth: Payer: Self-pay

## 2022-06-25 NOTE — Patient Outreach (Signed)
  Care Coordination [facility] Note Transition Care Management Follow-up Telephone Call Date of discharge and from where: 06/18/22 [facility] How have you been since you were released from the hospital? "I am doing fairly well, my pain is slowly improving". Any questions or concerns? No  Items Reviewed: Did the pt receive and understand the discharge instructions provided? Yes  Medications obtained and verified? Yes  Other? No  Any new allergies since your discharge? No  Dietary orders reviewed? No Do you have support at home? Yes   Home Care and Equipment/Supplies: Were home health services ordered? no If so, what is the name of the agency? N/a  Has the agency set up a time to come to the patient's home? no Were any new equipment or medical supplies ordered?  No What is the name of the medical supply agency? N/A Were you able to get the supplies/equipment? no Do you have any questions related to the use of the equipment or supplies? No  Functional Questionnaire: (I = Independent and D = Dependent) ADLs: Needs assistance  Bathing/Dressing- Needs assistance  Meal Prep- D  Eating- I  Maintaining continence- I  Transferring/Ambulation- needs assistance  Managing Meds- I  Follow up appointments reviewed:  PCP Hospital f/u appt confirmed? No   Specialist Hospital f/u appt confirmed? Yes  Scheduled to see Dr.  Jake Samples on 07/02/21 @ 9am. Are transportation arrangements needed? No  If their condition worsens, is the pt aware to call PCP or go to the Emergency Dept.? Yes Was the patient provided with contact information for the PCP's office or ED? Yes Was to pt encouraged to call back with questions or concerns? Yes  SDOH assessments and interventions completed:   Yes   Care Coordination Interventions:  No Care Coordination interventions needed at this time.   Encounter Outcome:  Pt. Visit Completed

## 2022-06-29 ENCOUNTER — Other Ambulatory Visit: Payer: Self-pay | Admitting: Surgical

## 2022-06-29 DIAGNOSIS — M180 Bilateral primary osteoarthritis of first carpometacarpal joints: Secondary | ICD-10-CM

## 2022-07-27 ENCOUNTER — Other Ambulatory Visit: Payer: Self-pay | Admitting: Internal Medicine

## 2022-07-27 DIAGNOSIS — M4800 Spinal stenosis, site unspecified: Secondary | ICD-10-CM

## 2022-07-27 DIAGNOSIS — G8929 Other chronic pain: Secondary | ICD-10-CM

## 2022-08-15 ENCOUNTER — Telehealth: Payer: Self-pay | Admitting: Orthopedic Surgery

## 2022-08-15 NOTE — Telephone Encounter (Signed)
Patient called asked if she need to see Dr. Marlou Sa again before she can schedule surgery for her left knee? The number to contact patient is [phone]

## 2022-08-15 NOTE — Telephone Encounter (Signed)
IC s/w patient and advised will need return office visit and xrays

## 2022-08-22 ENCOUNTER — Ambulatory Visit (INDEPENDENT_AMBULATORY_CARE_PROVIDER_SITE_OTHER): Payer: Medicare Other | Admitting: Orthopedic Surgery

## 2022-08-22 ENCOUNTER — Encounter: Payer: Self-pay | Admitting: Orthopedic Surgery

## 2022-08-22 ENCOUNTER — Ambulatory Visit: Payer: Self-pay

## 2022-08-22 DIAGNOSIS — M25562 Pain in left knee: Secondary | ICD-10-CM

## 2022-08-22 DIAGNOSIS — M1712 Unilateral primary osteoarthritis, left knee: Secondary | ICD-10-CM | POA: Diagnosis not present

## 2022-08-22 NOTE — Progress Notes (Signed)
Office Visit Note   Patient: Stephanie Noble           Date of Birth: Jun 10, 1956           MRN: [id number] Visit Date: 08/22/2022 Requested by: Lindell Spar, MD [address] [place],  [place] [address] PCP: Lindell Spar, MD  Subjective: Chief Complaint  Patient presents with   Left Knee - Pain    HPI: Stephanie Noble is a 67 y.o. female who presents to the office reporting left knee pain.  She states the left knee pain is keeping her from doing a lot of activities of her daily life without significant pain.  She is used to walking 2 miles a day but cannot come anywhere close to that amount of walking currently.  She is rehabilitating from her neck fusion.  Describes generally constant pain.  Left knee "locks up" at times.  The pain does wake her from sleep and also keeps her from sleeping.  She had gel injection last April which did not help.  Cortisone injections have helped but only for 1 to 2 days in the past.  She did have an event in 2017 after neck surgery where she coded.  It was believed at that time that that was related to medication.  She is allergic to Dilaudid.  She is okay taking oxycodone and Norco..                ROS: All systems reviewed are negative as they relate to the chief complaint within the history of present illness.  Patient denies fevers or chills.  Assessment & Plan: Visit Diagnoses:  1. Left knee pain, unspecified chronicity   2. Unilateral primary osteoarthritis, left knee     Plan: Impression is left knee arthritis which is end-stage primarily in the medial compartment.  Lateral patellofemoral compartments also affected.  Plan Long discussion today about risk and benefits of operative versus nonoperative treatment.  In general she is having enough pain and symptoms that her quality of life is being affected to the degree that she wants to proceed with surgical intervention.  The nature of the surgery as well as the rehabilitative process is  discussed.  Extensive nature of the rehabilitative effort is also discussed.  The risk and benefits including but limited to infection nerve vessel damage stiffness as well as discussions about implant longevity were made.  All questions answered.  She is looking to get this done sometime within the next 6 to 8 weeks.  All questions answered.  Follow-Up Instructions: No follow-ups on file.   Orders:  Orders Placed This Encounter  Procedures   XR KNEE 3 VIEW LEFT   No orders of the defined types were placed in this encounter.     Procedures: No procedures performed   Clinical Data: No additional findings.  Objective: Vital Signs: There were no vitals taken for this visit.  Physical Exam:  Constitutional: Patient appears well-developed HEENT:  Head: Normocephalic Eyes:EOM are normal Neck: Normal range of motion Cardiovascular: Normal rate Pulmonary/chest: Effort normal Neurologic: Patient is alert Skin: Skin is warm Psychiatric: Patient has normal mood and affect  Ortho Exam: Ortho exam demonstrates palpable pedal pulses on the left.  No groin pain on the left with internal/external rotation of the leg.  Extensor mechanism intact.  Collateral crucial ligaments are stable.  Skin is intact in that left knee region.  Specialty Comments:  No specialty comments available.  Imaging: No results found.  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   Hip arthritis 11/09/2018   Lumbar post-laminectomy syndrome 08/12/2017   Pain in thoracic  spine 05/13/2017   PTSD (post-traumatic stress disorder) 04/13/2017   Hallux rigidus, left foot 09/15/2016   Hallux rigidus, right foot 09/15/2016   HNP (herniated nucleus pulposus), lumbar 04/16/2016   Acquired scoliosis 09/07/2015   Cervical spondylosis with radiculopathy 06/22/2015   Dysphagia 04/26/2014   Insomnia 01/04/2013   Past Medical History:  Diagnosis Date   Anxiety    Asthma    uses albuterol as needed "mostly weather induced".ALbuterol and Ventolin inhaler as needed   Chronic back pain    Chronic neck pain    Complication of anesthesia    post-op PEA arrest 04/16/16, thought due to respriatory arrest from sedation, narcotic, hypovolemia   Degenerative disc disease    cervical and lumbar area   Depression    History of bronchitis 2016   History of cervical spinal surgery 12/30/2019   History of lumbar surgery 12/30/2019   Insomnia    takes Ambien nightly   Muscle spasm    takes Robaxin daily    Pneumonia    hx of-2016   PONV (postoperative nausea and vomiting)    Weakness    numbness and tingling in legs    Family History  Problem Relation Age of Onset   Dementia Mother    Other Sister    Breast cancer Sister    Alcohol abuse Other    Breast cancer Paternal Aunt     Past Surgical History:  Procedure Laterality Date   ANTERIOR CERVICAL DECOMP/DISCECTOMY FUSION N/A 09/05/2021   Procedure: CERVICAL THREE-FOUR ANTERIOR CERVICAL DECOMPRESSION/DISCECTOMY FUSION WITH PARTIAL PLATE REMOVAL AT CERVICAL FOUR-FIVE;  Surgeon: Karsten Ro, DO;  Location: [facility];  Service: Neurosurgery;  Laterality: N/A;  3C   BACK SURGERY     x 3 lumbar   CHOLECYSTECTOMY     DILATION AND CURETTAGE OF UTERUS     x 2   JOINT REPLACEMENT     KNEE SURGERY Left    x 2   left knee arthroscopy      x 2   POSTERIOR CERVICAL FUSION/FORAMINOTOMY  05/17/2012   Procedure: POSTERIOR CERVICAL FUSION/FORAMINOTOMY LEVEL 2;  Surgeon: Hosie Spangle, MD;  Location: [facility];  Service:  Neurosurgery;  Laterality: N/A;  cervical six to Thoracic One posterior cervical arthrodesis with instrumentation   POSTERIOR CERVICAL FUSION/FORAMINOTOMY N/A 06/17/2022   Procedure: CERVICAL TWO-THREE, CERVICAL THREE-FOUR, CERVICAL FOUR-FIVE POSTERIOR CERVICAL INSTRUMENTATION AND FUSION WITH EXTENSION TO CERVICAL SIX-THORACIC ONE FUSION;  Surgeon: Dawley, Theodoro Doing, DO;  Location: [facility];  Service: Neurosurgery;  Laterality: N/A;   SHOULDER ARTHROSCOPY Left 10/27/2019   Procedure: left shoulder arthroscopy with arthroscopic vs open distal clavical excision;  Surgeon: Meredith Pel, MD;  Location: [facility];  Service: Orthopedics;  Laterality: Left;   SHOULDER SURGERY     right   SPINE SURGERY     x 5   TOE FUSION Left 09/2016   TOTAL HIP ARTHROPLASTY Left 11/09/2018  Procedure: LEFT TOTAL HIP ARTHROPLASTY DIRECT ANTERIOR APPROACH;  Surgeon: Meredith Pel, MD;  Location: [facility];  Service: Orthopedics;  Laterality: Left;   Social History   Occupational History   Not on file  Tobacco Use   Smoking status: Never   Smokeless tobacco: Never  Vaping Use   Vaping Use: Never used  Substance and Sexual Activity   Alcohol use: No   Drug use: No   Sexual activity: Yes    Birth control/protection: Post-menopausal

## 2022-08-26 ENCOUNTER — Other Ambulatory Visit: Payer: Self-pay | Admitting: Surgical

## 2022-08-26 DIAGNOSIS — M180 Bilateral primary osteoarthritis of first carpometacarpal joints: Secondary | ICD-10-CM

## 2022-08-28 ENCOUNTER — Other Ambulatory Visit: Payer: Self-pay | Admitting: Internal Medicine

## 2022-08-28 DIAGNOSIS — Z1211 Encounter for screening for malignant neoplasm of colon: Secondary | ICD-10-CM

## 2022-08-28 DIAGNOSIS — Z1231 Encounter for screening mammogram for malignant neoplasm of breast: Secondary | ICD-10-CM

## 2022-09-02 ENCOUNTER — Other Ambulatory Visit: Payer: Self-pay | Admitting: Internal Medicine

## 2022-09-02 DIAGNOSIS — M4722 Other spondylosis with radiculopathy, cervical region: Secondary | ICD-10-CM

## 2022-09-02 DIAGNOSIS — Z9889 Other specified postprocedural states: Secondary | ICD-10-CM

## 2022-09-04 ENCOUNTER — Encounter: Payer: Self-pay | Admitting: Internal Medicine

## 2022-09-04 ENCOUNTER — Ambulatory Visit (INDEPENDENT_AMBULATORY_CARE_PROVIDER_SITE_OTHER): Payer: Medicare Other | Admitting: Internal Medicine

## 2022-09-04 ENCOUNTER — Telehealth: Payer: Self-pay | Admitting: Orthopedic Surgery

## 2022-09-04 VITALS — BP 119/78 | HR 73 | Ht 65.0 in | Wt 148.6 lb

## 2022-09-04 DIAGNOSIS — M5126 Other intervertebral disc displacement, lumbar region: Secondary | ICD-10-CM

## 2022-09-04 DIAGNOSIS — F5104 Psychophysiologic insomnia: Secondary | ICD-10-CM | POA: Diagnosis not present

## 2022-09-04 DIAGNOSIS — M4722 Other spondylosis with radiculopathy, cervical region: Secondary | ICD-10-CM

## 2022-09-04 DIAGNOSIS — H65112 Acute and subacute allergic otitis media (mucoid) (sanguinous) (serous), left ear: Secondary | ICD-10-CM | POA: Diagnosis not present

## 2022-09-04 DIAGNOSIS — L719 Rosacea, unspecified: Secondary | ICD-10-CM | POA: Diagnosis not present

## 2022-09-04 MED ORDER — OFLOXACIN 0.3 % OT SOLN: 5.0000 [drp] | Freq: Every day | OTIC | 0 refills | Status: DC

## 2022-09-04 NOTE — Progress Notes (Signed)
Established Patient Office Visit  Subjective:  Patient ID: Stephanie Noble, female    DOB: 02-23-1956  Age: 67 y.o. MRN: [id number]  CC:  Chief Complaint  Patient presents with   Ear Fullness    Patient states for the past couple of the months her left ear has felt like something is in it, says no drainage and no pain until this morning she noticed is she was hiccupping her ear had pain. She has a spot on her nose she would like to be referred to dermatology     HPI Stephanie Noble is a 67 y.o. 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  She complains of left ear fullness for for the last 2 months.  She has been trying to apply rubbing alcohol in the left ear.  She noticed left ear pain radiating to left jaw area today.  Denies any ear discharge or bleeding.  Denies any hearing problem currently.  Denies any fever or chills.  Denies any nasal congestion, postnasal drip or sinus pressure related headache.  She also has nose area redness and thickening of the skin.  She has been told of rosacea by Dr. Hall-dermatology.  She has not not tried using facial cleanser as advised by dermatology.  Patient has a longstanding history of insomnia, for which she has been taking Ambien.  She denies any anhedonia, recent weight or appetite changes or suicidal ideation.  Patient has been having chronic neck pain, which is constant, variable in intensity, worse with movement and is associated with numbness and weakness in bilateral upper extremities.  Patient has had fusion surgery in 2013 and 2014. Patient also complains of lower back pain, which refers to the buttocks area.  Patient takes Robaxin for muscle spasms.  Patient takes gabapentin and Cymbalta for neuropathy pain.  Patient used to see pain specialist  in the past.  She also complains of chronic knee pain and swelling, for which she takes Mobic as needed, with some relief. She sees Dr Marlou Sa for OA of knee.     Past Medical History:  Diagnosis Date   Anxiety    Asthma    uses albuterol as needed "mostly weather induced".ALbuterol and Ventolin inhaler as needed   Chronic back pain    Chronic neck pain    Complication of anesthesia    post-op PEA arrest 04/16/16, thought due to respriatory arrest from sedation, narcotic, hypovolemia   Degenerative disc disease    cervical and lumbar area   Depression    History of bronchitis 2016   History of cervical spinal surgery 12/30/2019   History of lumbar surgery 12/30/2019   Insomnia    takes Ambien nightly   Muscle spasm    takes Robaxin daily    Pneumonia    hx of-2016   PONV (postoperative nausea and vomiting)    Weakness    numbness and tingling in legs    Past Surgical History:  Procedure Laterality Date   ANTERIOR CERVICAL DECOMP/DISCECTOMY FUSION N/A 09/05/2021   Procedure: CERVICAL THREE-FOUR ANTERIOR CERVICAL DECOMPRESSION/DISCECTOMY FUSION WITH PARTIAL PLATE REMOVAL AT CERVICAL FOUR-FIVE;  Surgeon: Karsten Ro, DO;  Location: [facility];  Service: Neurosurgery;  Laterality: N/A;  3C   BACK SURGERY     x 3 lumbar   CHOLECYSTECTOMY     DILATION  AND CURETTAGE OF UTERUS     x 2   JOINT REPLACEMENT     KNEE SURGERY Left    x 2   left knee arthroscopy      x 2   POSTERIOR CERVICAL FUSION/FORAMINOTOMY  05/17/2012   Procedure: POSTERIOR CERVICAL FUSION/FORAMINOTOMY LEVEL 2;  Surgeon: Hosie Spangle, MD;  Location: [facility];  Service: Neurosurgery;  Laterality: N/A;  cervical six to Thoracic One posterior cervical arthrodesis with instrumentation   POSTERIOR CERVICAL FUSION/FORAMINOTOMY N/A 06/17/2022   Procedure: CERVICAL TWO-THREE, CERVICAL THREE-FOUR, CERVICAL FOUR-FIVE POSTERIOR CERVICAL INSTRUMENTATION AND FUSION WITH EXTENSION TO CERVICAL SIX-THORACIC ONE FUSION;  Surgeon:  Dawley, Theodoro Doing, DO;  Location: [facility];  Service: Neurosurgery;  Laterality: N/A;   SHOULDER ARTHROSCOPY Left 10/27/2019   Procedure: left shoulder arthroscopy with arthroscopic vs open distal clavical excision;  Surgeon: Meredith Pel, MD;  Location: [facility];  Service: Orthopedics;  Laterality: Left;   SHOULDER SURGERY     right   SPINE SURGERY     x 5   TOE FUSION Left 09/2016   TOTAL HIP ARTHROPLASTY Left 11/09/2018   Procedure: LEFT TOTAL HIP ARTHROPLASTY DIRECT ANTERIOR APPROACH;  Surgeon: Meredith Pel, MD;  Location: [facility];  Service: Orthopedics;  Laterality: Left;    Family History  Problem Relation Age of Onset   Dementia Mother    Other Sister    Breast cancer Sister    Alcohol abuse Other    Breast cancer Paternal Aunt     Social History   Socioeconomic History   Marital status: Married    Spouse name: Not on file   Number of children: Not on file   Years of education: Not on file   Highest education level: Not on file  Occupational History   Not on file  Tobacco Use   Smoking status: Never   Smokeless tobacco: Never  Vaping Use   Vaping Use: Never used  Substance and Sexual Activity   Alcohol use: No   Drug use: No   Sexual activity: Yes    Birth control/protection: Post-menopausal  Other Topics Concern   Not on file  Social History Narrative   Not on file   Social Determinants of Health   Financial Resource Strain: Not on file  Food Insecurity: No Food Insecurity (06/17/2022)   Hunger Vital Sign    Worried About Running Out of Food in the Last Year: Never true    Ran Out of Food in the Last Year: Never true  Transportation Needs: No Transportation Needs (06/17/2022)   PRAPARE - Hydrologist (Medical): No    Lack of Transportation (Non-Medical): No  Physical Activity: Not on file  Stress: Not on file  Social Connections: Not on file  Intimate Partner Violence: Not At Risk (06/17/2022)   Humiliation, Afraid,  Rape, and Kick questionnaire    Fear of Current or Ex-Partner: No    Emotionally Abused: No    Physically Abused: No    Sexually Abused: No    Outpatient Medications Prior to Visit  Medication Sig Dispense Refill   albuterol (PROVENTIL) (2.5 MG/3ML) 0.083% nebulizer solution Take 3 mLs (2.5 mg total) by nebulization every 4 (four) hours as needed for wheezing or shortness of breath. 150 mL 5   albuterol (VENTOLIN HFA) 108 (90 Base) MCG/ACT inhaler Inhale 2 sprays QID prn wheezing (Patient taking differently: Inhale 2 puffs into the lungs 4 (four) times daily as needed for wheezing  or shortness of breath.) 18 g 0   DULoxetine (CYMBALTA) 20 MG capsule Take 1 capsule (20 mg total) by mouth daily. 90 capsule 1   folic acid (FOLVITE) 1 MG tablet Take 1 tablet (1 mg total) by mouth daily. 90 tablet 3   gabapentin (NEURONTIN) 300 MG capsule TAKE 2 CAPSULE IN THE MORNING , 2 AT LUNCH AND 3 AT BEDTIME. 210 capsule 2   meloxicam (MOBIC) 7.5 MG tablet TAKE 1 TABLET BY MOUTH EVERY DAY 30 tablet 1   methocarbamol (ROBAXIN) 750 MG tablet TAKE 1 TABLET BY MOUTH THREE TIMES A DAY AS NEEDED 90 tablet 2   mupirocin ointment (BACTROBAN) 2 % Apply topically.     Omega-3 Fatty Acids (FISH OIL) 1200 MG CAPS Take 1,200 mg by mouth 2 (two) times a week.     oxyCODONE (ROXICODONE) 5 MG immediate release tablet Take 2 tablets (10 mg total) by mouth every 6 (six) hours as needed. Can take 1-2 tablets 30 tablet 0   Propylene Glycol (SYSTANE BALANCE) 0.6 % SOLN Place 1 drop into both eyes 2 (two) times daily as needed (dry eyes).     zolpidem (AMBIEN) 10 MG tablet Take 1 tablet (10 mg total) by mouth at bedtime as needed for sleep. 30 tablet 3   No facility-administered medications prior to visit.    Allergies  Allergen Reactions   Vistaril [Hydroxyzine Hcl] Other (See Comments)    Pt went into cardiac arrest    Hydromorphone Hcl Hives    Per report from PACU RN, pt had itching on arrival to pacu then developed  hives after dilaudid given. Discussed with Dr. Reatha Armour and dilaudid allergy being added to record    Dilaudid [Hydromorphone] Rash    ROS Review of Systems  Constitutional:  Negative for chills and fever.  HENT:  Positive for ear pain (Left) and mouth sores. Negative for congestion, sinus pressure, sinus pain and sore throat.   Eyes:  Negative for pain and discharge.  Respiratory:  Negative for cough and shortness of breath.   Cardiovascular:  Negative for chest pain and palpitations.  Gastrointestinal:  Negative for abdominal pain, constipation, diarrhea, nausea and vomiting.  Endocrine: Negative for polydipsia and polyuria.  Genitourinary:  Negative for dysuria and hematuria.  Musculoskeletal:  Positive for back pain and neck pain. Negative for neck stiffness.       B/l hand pain Knee pain  Skin:  Negative for rash.  Neurological:  Positive for weakness (B/l UE) and numbness (B/l UE). Negative for dizziness and headaches.  Psychiatric/Behavioral:  Negative for agitation and behavioral problems.       Objective:    Physical Exam Vitals reviewed.  Constitutional:      General: She is not in acute distress.    Appearance: She is not diaphoretic.  HENT:     Head: Normocephalic and atraumatic.     Left Ear: Drainage (Whitish in external ear canal) present. A middle ear effusion is present. No mastoid tenderness.     Nose: Nose normal.     Mouth/Throat:     Mouth: Mucous membranes are dry. Oral lesions present.  Eyes:     General: No scleral icterus.    Extraocular Movements: Extraocular movements intact.     Pupils: Pupils are equal, round, and reactive to light.  Neck:     Comments: Well-healed scar in the cervical (neck) area Cardiovascular:     Rate and Rhythm: Normal rate and regular rhythm.     Pulses:  Normal pulses.     Heart sounds: Normal heart sounds. No murmur heard.    No friction rub. No gallop.  Pulmonary:     Breath sounds: Normal breath sounds. No wheezing  or rales.  Musculoskeletal:     Cervical back: Neck supple. Tenderness present.     Right lower leg: No edema.     Left lower leg: No edema.  Skin:    General: Skin is warm.     Findings: Erythema (Over nose) present. No rash.  Neurological:     General: No focal deficit present.     Mental Status: She is alert and oriented to person, place, and time.     Sensory: No sensory deficit.     Motor: No weakness.  Psychiatric:        Mood and Affect: Mood normal.        Behavior: Behavior normal.     BP 119/78 (BP Location: Right Arm, Patient Position: Sitting, Cuff Size: Normal)   Pulse 73   Ht '5\' 5"'$  (1.651 m)   Wt 148 lb 9.6 oz (67.4 kg)   SpO2 97%   BMI 24.73 kg/m  Wt Readings from Last 3 Encounters:  09/04/22 148 lb 9.6 oz (67.4 kg)  06/17/22 140 lb (63.5 kg)  06/10/22 146 lb 8 oz (66.5 kg)    Lab Results  Component Value Date   TSH 4.000 06/26/2017   Lab Results  Component Value Date   WBC 4.2 06/10/2022   HGB 13.9 06/10/2022   HCT 41.0 06/10/2022   MCV 90.9 06/10/2022   PLT 222 06/10/2022   Lab Results  Component Value Date   NA 142 10/24/2019   K 4.0 10/24/2019   CO2 32 10/24/2019   GLUCOSE 60 (L) 10/24/2019   BUN 9 10/24/2019   CREATININE 0.77 10/24/2019   BILITOT 0.5 04/17/2016   ALKPHOS 39 04/17/2016   AST 87 (H) 04/17/2016   ALT 71 (H) 04/17/2016   PROT 5.6 (L) 04/17/2016   ALBUMIN 3.2 (L) 04/17/2016   CALCIUM 9.6 10/24/2019   ANIONGAP 8 10/24/2019   No results found for: "CHOL" No results found for: "HDL" No results found for: "LDLCALC" No results found for: "TRIG" No results found for: "CHOLHDL" No results found for: "HGBA1C"    Assessment & Plan:   Problem List Items Addressed This Visit       Nervous and Auditory   Cervical spondylosis with radiculopathy    S/p C3-C4 decompression with fusion surgery Has persistent neck pain On gabapentin, Cymbalta and Robaxin Followed by Spine Surgery - planned to get revision of cervical  fusion      Acute mucoid otitis media of left ear - Primary    Left ear discharge and middle ear effusion Started ofloxacin eardrops Advised to avoid rubbing alcohol use      Relevant Medications   ofloxacin (FLOXIN) 0.3 % OTIC solution     Musculoskeletal and Integument   HNP (herniated nucleus pulposus), lumbar    S/p lumbar spine surgery Has sciatica pain, referral to Spine surgery Continue Robaxin for muscle spasms On gabapentin 600 mg QAM and at lunch, and 900 mg nightly      Rosacea    Needs to use facial cleanser Can discuss with dermatology about [employer]el        Other   Insomnia    Well-controlled with Ambien PDMP reviewed, refilled Ambien       Meds ordered this encounter  Medications   ofloxacin (FLOXIN)  0.3 % OTIC solution    Sig: Place 5 drops into the left ear daily.    Dispense:  5 mL    Refill:  0    Follow-up: Return if symptoms worsen or fail to improve.    Lindell Spar, MD

## 2022-09-04 NOTE — Telephone Encounter (Signed)
Patient called wanting to know when surgery would be scheduled.  I returned call.  No answer.  Left message with my name and direct number for patient to return call.

## 2022-09-04 NOTE — Assessment & Plan Note (Signed)
S/p C3-C4 decompression with fusion surgery Has persistent neck pain On gabapentin, Cymbalta and Robaxin Followed by Spine Surgery - planned to get revision of cervical fusion

## 2022-09-04 NOTE — Assessment & Plan Note (Signed)
Well-controlled with Ambien PDMP reviewed, refilled Ambien

## 2022-09-04 NOTE — Assessment & Plan Note (Signed)
Left ear discharge and middle ear effusion Started ofloxacin eardrops Advised to avoid rubbing alcohol use

## 2022-09-04 NOTE — Assessment & Plan Note (Signed)
S/p lumbar spine surgery Has sciatica pain, referral to Spine surgery Continue Robaxin for muscle spasms On gabapentin 600 mg QAM and at lunch, and 900 mg nightly

## 2022-09-04 NOTE — Patient Instructions (Signed)
Please start applying Ofloxacin otic drops as prescribed.  Avoid applying any other otic drops or using any sharp objects for cleaning purposes.

## 2022-09-04 NOTE — Assessment & Plan Note (Signed)
Needs to use facial cleanser Can discuss with dermatology about MetroGel

## 2022-09-17 ENCOUNTER — Other Ambulatory Visit: Payer: Self-pay

## 2022-09-17 ENCOUNTER — Telehealth: Payer: Self-pay | Admitting: Internal Medicine

## 2022-09-17 DIAGNOSIS — H6690 Otitis media, unspecified, unspecified ear: Secondary | ICD-10-CM

## 2022-09-17 NOTE — Telephone Encounter (Signed)
Pt called in regard to recent ear infection. Was advised if not cleared with drops in 5 days that can be referred to ENT  Ears not cleared. Wants to see [facility]   Pt is having total knee replacement on 4/9 wants to have ear cleared up by then.  Pt wants a call back.

## 2022-09-18 ENCOUNTER — Telehealth: Payer: Self-pay | Admitting: Internal Medicine

## 2022-09-18 NOTE — Telephone Encounter (Signed)
Spoke to patient

## 2022-09-18 NOTE — Telephone Encounter (Signed)
Pt called very upset that she can't get thru to ENT office. Wants to know if nurse can please do something for her?

## 2022-09-25 ENCOUNTER — Encounter (HOSPITAL_COMMUNITY): Payer: Self-pay

## 2022-09-25 ENCOUNTER — Encounter (HOSPITAL_COMMUNITY)
Admission: RE | Admit: 2022-09-25 | Discharge: 2022-09-25 | Disposition: A | Discharge status: Home / Self Care | Payer: Medicare Other | Source: Ambulatory Visit | Attending: Orthopedic Surgery | Admitting: Orthopedic Surgery

## 2022-09-25 ENCOUNTER — Other Ambulatory Visit: Payer: Self-pay

## 2022-09-25 VITALS — BP 124/80 | HR 63 | Temp 98.1°F | Resp 18 | Ht 65.0 in | Wt 145.6 lb

## 2022-09-25 DIAGNOSIS — Z01812 Encounter for preprocedural laboratory examination: Secondary | ICD-10-CM | POA: Diagnosis present

## 2022-09-25 DIAGNOSIS — Z01818 Encounter for other preprocedural examination: Secondary | ICD-10-CM

## 2022-09-25 LAB — BASIC METABOLIC PANEL
Anion gap: 10 (ref 5–15)
BUN: 13 mg/dL (ref 8–23)
CO2: 29 mmol/L (ref 22–32)
Calcium: 9.6 mg/dL (ref 8.9–10.3)
Chloride: 100 mmol/L (ref 98–111)
Creatinine, Ser: 0.83 mg/dL (ref 0.44–1.00)
GFR, Estimated: >60 mL/min (ref >60)
Glucose, Bld: 82 mg/dL (ref 70–99)
Potassium: 4.3 mmol/L (ref 3.5–5.1)
Sodium: 139 mmol/L (ref 135–145)

## 2022-09-25 LAB — URINALYSIS, COMPLETE (UACMP) WITH MICROSCOPIC
Bacteria, UA: NONE SEEN
Bilirubin Urine: NEGATIVE
Glucose, UA: NEGATIVE mg/dL
Ketones, ur: NEGATIVE mg/dL
Leukocytes,Ua: NEGATIVE
Nitrite: NEGATIVE
Protein, ur: NEGATIVE mg/dL
Specific Gravity, Urine: 1.008 (ref 1.005–1.030)
pH: 6 (ref 5.0–8.0)

## 2022-09-25 LAB — CBC
HCT: 42.5 % (ref 36.0–46.0)
Hemoglobin: 14.1 g/dL (ref 12.0–15.0)
MCH: 30.6 pg (ref 26.0–34.0)
MCHC: 33.2 g/dL (ref 30.0–36.0)
MCV: 92.2 fL (ref 80.0–100.0)
Platelets: 221 10*3/uL (ref 150–400)
RBC: 4.61 MIL/uL (ref 3.87–5.11)
RDW: 12.6 % (ref 11.5–15.5)
WBC: 4 10*3/uL (ref 4.0–10.5)
nRBC: 0 % (ref 0.0–0.2)

## 2022-09-25 LAB — SURGICAL PCR SCREEN
MRSA, PCR: NEGATIVE
Staphylococcus aureus: NEGATIVE

## 2022-09-25 NOTE — Progress Notes (Signed)
PCP - Dr. Ihor Dow Cardiologist - denies  PPM/ICD - n/a  Chest x-ray - n/a EKG - n/a Stress Test - denies ECHO - 04/19/2016 Cardiac Cath - denies  Sleep Study - denies CPAP - denies  Fasting Blood Sugar - n/a Checks Blood Sugar _____ times a day- n/a  Last dose of GLP1 agonist-  n/a GLP1 instructions: n/a  Blood Thinner Instructions: n/a Aspirin Instructions: n/a  ERAS Protcol - No. NPO  COVID TEST- n/a   Anesthesia review: Chart reviewed for previous surgery on 06/17/22. Cardiac arrest post surgery in 2017. No post-surgical problems since then per patient.   Patient denies shortness of breath, fever, cough and chest pain at PAT appointment   All instructions explained to the patient, with a verbal understanding of the material. Patient agrees to go over the instructions while at home for a better understanding. The opportunity to ask questions was provided.

## 2022-09-25 NOTE — Pre-Procedure Instructions (Signed)
Surgical Instructions    Your procedure is scheduled on October 07, 2022.  Report to [facility] Main Entrance "A" at 5:30 A.M., then check in with the Admitting office.  Call this number if you have problems the morning of surgery:  [phone]  If you have any questions prior to your surgery date call [phone]: Open Monday-Friday 8am-4pm If you experience any cold or flu symptoms such as cough, fever, chills, shortness of breath, etc. between now and your scheduled surgery, please notify us at the above number.     Remember:  Do not eat after midnight the night before your surgery  You may drink clear liquids until 4:30 AM the morning of your surgery.   Clear liquids allowed are: Water, Non-Citrus Juices (without pulp), Carbonated Beverages, Clear Tea, Black Coffee Only (NO MILK, CREAM OR POWDERED CREAMER of any kind), and Gatorade.  Patient Instructions  The night before surgery:  No food after midnight. ONLY clear liquids after midnight  The day of surgery (if you do NOT have diabetes):  Drink ONE (1) Pre-Surgery Clear Ensure by 4:30 AM the morning of surgery. Drink in one sitting. Do not sip.  This drink was given to you during your hospital  pre-op appointment visit.  Nothing else to drink after completing the  Pre-Surgery Clear Ensure.         If you have questions, please contact your surgeon's office.      Take these medicines the morning of surgery with A SIP OF WATER:  DULoxetine (CYMBALTA)   gabapentin (NEURONTIN)     May take these medicines IF NEEDED:  albuterol (PROVENTIL) nebulizer solution   albuterol (VENTOLIN HFA) inhaler   methocarbamol (ROBAXIN)   oxyCODONE (ROXICODONE)   Propylene Glycol (SYSTANE BALANCE) eye drops    As of today, STOP taking any Aspirin (unless otherwise instructed by your surgeon) Aleve, Naproxen, Ibuprofen, Motrin, Advil, Goody's, BC's, all herbal medications, fish oil, and all vitamins. This includes your medication:  meloxicam (MOBIC)                      Do NOT Smoke (Tobacco/Vaping) for 24 hours prior to your procedure.  If you use a CPAP at night, you may bring your mask/headgear for your overnight stay.   Contacts, glasses, piercing's, hearing aid's, dentures or partials may not be worn into surgery, please bring cases for these belongings.    For patients admitted to the hospital, discharge time will be determined by your treatment team.   Patients discharged the day of surgery will not be allowed to drive home, and someone needs to stay with them for 24 hours.  SURGICAL WAITING ROOM VISITATION Patients having surgery or a procedure may have no more than 2 support people in the waiting area - these visitors may rotate.   Children under the age of 41 must have an adult with them who is not the patient. If the patient needs to stay at the hospital during part of their recovery, the visitor guidelines for inpatient rooms apply. Pre-op nurse will coordinate an appropriate time for 1 support person to accompany patient in pre-op.  This support person may not rotate.   Please refer to the [facility] website for the visitor guidelines for Inpatients (after your surgery is over and you are in a regular room).    Special instructions:   [facility]- Preparing For Surgery  Before surgery, you can play an important role. Because skin is not sterile, your  skin needs to be as free of germs as possible. You can reduce the number of germs on your skin by washing with CHG (chlorahexidine gluconate) Soap before surgery.  CHG is an antiseptic cleaner which kills germs and bonds with the skin to continue killing germs even after washing.    Oral Hygiene is also important to reduce your risk of infection.  Remember - BRUSH YOUR TEETH THE MORNING OF SURGERY WITH YOUR REGULAR TOOTHPASTE  Please do not use if you have an allergy to CHG or antibacterial soaps. If your skin becomes reddened/irritated stop using the CHG.   Do not shave (including legs and underarms) for at least 48 hours prior to first CHG shower. It is OK to shave your face.  Please follow these instructions carefully.   Shower the NIGHT BEFORE SURGERY and the MORNING OF SURGERY  If you chose to wash your hair, wash your hair first as usual with your normal shampoo.  After you shampoo, rinse your hair and body thoroughly to remove the shampoo.  Use CHG Soap as you would any other liquid soap. You can apply CHG directly to the skin and wash gently with a scrungie or a clean washcloth.   Apply the CHG Soap to your body ONLY FROM THE NECK DOWN.  Do not use on open wounds or open sores. Avoid contact with your eyes, ears, mouth and genitals (private parts). Wash Face and genitals (private parts)  with your normal soap.   Wash thoroughly, paying special attention to the area where your surgery will be performed.  Thoroughly rinse your body with warm water from the neck down.  DO NOT shower/wash with your normal soap after using and rinsing off the CHG Soap.  Pat yourself dry with a CLEAN TOWEL.  Wear CLEAN PAJAMAS to bed the night before surgery  Place CLEAN SHEETS on your bed the night before your surgery  DO NOT SLEEP WITH PETS.   Day of Surgery: Take a shower with CHG soap. Do not wear jewelry or makeup Do not wear lotions, powders, perfumes/colognes, or deodorant. Do not shave 48 hours prior to surgery.  Men may shave face and neck. Do not bring valuables to the hospital.  [facility] is not responsible for any belongings or valuables. Do not wear nail polish, gel polish, artificial nails, or any other type of covering on natural nails (fingers and toes) If you have artificial nails or gel coating that need to be removed by a nail salon, please have this removed prior to surgery. Artificial nails or gel coating may interfere with anesthesia's ability to adequately monitor your vital signs.  Wear Clean/Comfortable clothing the  morning of surgery Remember to brush your teeth WITH YOUR REGULAR TOOTHPASTE.   Please read over the following fact sheets that you were given.    If you received a COVID test during your pre-op visit  it is requested that you wear a mask when out in public, stay away from anyone that may not be feeling well and notify your surgeon if you develop symptoms. If you have been in contact with anyone that has tested positive in the last 10 days please notify you surgeon.

## 2022-09-26 ENCOUNTER — Inpatient Hospital Stay (HOSPITAL_COMMUNITY)
Admission: RE | Admit: 2022-09-26 | Discharge: 2022-09-26 | Disposition: A | Discharge status: Home / Self Care | Payer: Medicare Other | Source: Ambulatory Visit

## 2022-09-26 LAB — URINE CULTURE: Culture: NO GROWTH

## 2022-09-26 NOTE — Anesthesia Preprocedure Evaluation (Addendum)
Anesthesia Evaluation  Patient identified by MRN, date of birth, ID band Patient awake    Reviewed: Allergy & Precautions, NPO status , Patient's Chart, lab work & pertinent test results  History of Anesthesia Complications (+) PONV and history of anesthetic complications  Airway Mallampati: II  TM Distance: >3 FB Neck ROM: Full    Dental no notable dental hx. (+) Teeth Intact, Dental Advisory Given   Pulmonary asthma    Pulmonary exam normal breath sounds clear to auscultation       Cardiovascular negative cardio ROS Normal cardiovascular exam Rhythm:Regular Rate:Normal  Echo 04/19/16: Study Conclusions  - Left ventricle: The cavity size was normal. Wall thickness was    normal. Systolic function was normal. The estimated ejection    fraction was in the range of 60% to 65%. Wall motion was normal;    there were no regional wall motion abnormalities. Left    ventricular diastolic function parameters were normal.  - Aortic valve: Valve area (VTI): 3.78 cm^2. Valve area (Vmax):    4.28 cm^2.  - Atrial septum: No defect or patent foramen ovale was identified.  - Pulmonary arteries: Systolic pressure was mildly increased. PA    peak pressure: 36 mm Hg (S).  - Technically adequate study.     Neuro/Psych  PSYCHIATRIC DISORDERS Anxiety Depression    negative neurological ROS     GI/Hepatic negative GI ROS, Neg liver ROS,,,  Endo/Other  negative endocrine ROS    Renal/GU negative Renal ROS  negative genitourinary   Musculoskeletal  (+) Arthritis ,  S/p ACDF   Abdominal   Peds  Hematology negative hematology ROS (+)   Anesthesia Other Findings   History includes never smoker, post-operative N/V, asthma, insomnia, spinal surgery (L3-5 PLIF 07/16/09; C4-7 ACDF 04/16/11; C6-T1 posterior arthrodesis 05/17/12; explant L5 hardware, L2-3 posterolateral arthrodesis 04/16/16; C3-4 ACDF 09/05/21;C2-5 posterior arthrodesis with  exploration C6-T1 fusion 06/27/22 ), cholecystectomy (11/10/05), osteoarthritis (left THA 11/09/18), post-operative PEA arrest 04/16/16 (in PACU; likely hypoxemic induced PEA arrest due to acute respiratory failure following narcotic administration   Reproductive/Obstetrics                             Anesthesia Physical Anesthesia Plan  ASA: 2  Anesthesia Plan: General and Regional   Post-op Pain Management: Regional block* and Tylenol PO (pre-op)*   Induction: Intravenous  PONV Risk Score and Plan: 4 or greater and Midazolam, Dexamethasone, Ondansetron and Scopolamine patch - Pre-op  Airway Management Planned: Oral ETT  Additional Equipment:   Intra-op Plan:   Post-operative Plan: Extubation in OR  Informed Consent: I have reviewed the patients History and Physical, chart, labs and discussed the procedure including the risks, benefits and alternatives for the proposed anesthesia with the patient or authorized representative who has indicated his/her understanding and acceptance.     Dental advisory given  Plan Discussed with: CRNA  Anesthesia Plan Comments: (PAT note written 09/26/2022 by Shonna Chock, PA-C.  )       Anesthesia Quick Evaluation

## 2022-09-26 NOTE — Progress Notes (Signed)
Anesthesia Chart Review:  Case: Stephanie Noble Date/Time: 10/07/22 0715   Procedure: LEFT TOTAL KNEE ARTHROPLASTY (Left: Knee)   Anesthesia type: Choice   Pre-op diagnosis: left knee osteoarthritis   Location: [facility] OR ROOM 06 / [place] OR   Surgeons: Meredith Pel, MD       DISCUSSION: Patient is a 35 female scheduled for the above procedure.   History includes never smoker, post-operative N/V, asthma, insomnia, spinal surgery (L3-5 PLIF 07/16/09; C4-7 ACDF 04/16/11; C6-T1 posterior arthrodesis 05/17/12; explant L5 hardware, L2-3 posterolateral arthrodesis 04/16/16; C3-4 ACDF 09/05/21;C2-5 posterior arthrodesis with exploration C6-T1 fusion 06/27/22 ), cholecystectomy (11/10/05), osteoarthritis (left THA 11/09/18), post-operative PEA arrest 04/16/16 (in PACU; likely hypoxemic induced PEA arrest due to acute respiratory failure following narcotic administration).   Following 04/16/16 lumbar fusion she had a PEA arrest. According to records, she had been given Percocet and Celebrex for back pain and Vistaril for facial itching in PACU at [place]. Patient feeling better at [place], but at 2000, husband reported she "Just fell asleep after a hard deep breath". Patient noted to be have "purplish" lips and was unresponsive and pulseless. [facility] called and CPR initiated for approximately 5 minutes before ROSC.  Code documentation indicates first observed rhythm was asystole (she was not on telemetry at the time of initial arrest) with SR with PVCs after ROSC. She received Narcan 0.4 mg. She vomited 3 minutes after ROSC. Glucose 169. PCCM was consulted. Allergic reaction was considered given her itching, but no rash or angioedema noted, so the felt most likely explanation for her cardiac arrest was due to acute respiratory failure due to narcotic administration leading to hypoxemic induced PEA arrest. EKG findings did show findings worrisome for ischemia. Cardiac enzymes 0.03-0.24 H-0.18H. Off pressors by 04/18/16. CXR  showed low lung volumes without focal disease. 04/19/16 Echo showed normal LVEF with normal wall motion. Cardiology was not consulted. She was discharged home on 04/20/16.    Since then she underwent left THA on 11/09/18, left shoulder arthroscopy on 10/27/19, C3-4 ACDF on 09/05/21, and C2-5 posterior arthrodesis with exploration C6-T1 fusion on 06/27/22, all under GETA and without post-operative complication.    Anesthesia team to evaluate on the day of surgery.     VS: BP 124/80   Pulse 63   Temp 36.7 C (Oral)   Resp 18   Ht 5\' 5"  (1.651 m)   Wt 66 kg   SpO2 100%   BMI 24.23 kg/m   PROVIDERS: Lindell Spar, MD is PCP   LABS: Labs reviewed: Acceptable for surgery. (all labs ordered are listed, but only abnormal results are displayed)  Labs Reviewed  URINALYSIS, COMPLETE (UACMP) WITH MICROSCOPIC - Abnormal; Notable for the following components:      Result Value   Hgb urine dipstick SMALL (*)    All other components within normal limits  SURGICAL PCR SCREEN  URINE CULTURE  CBC  BASIC METABOLIC PANEL     EKG: [id number]: NSR at 68 bpm     CV: Echo 04/19/16: Study Conclusions  - Left ventricle: The cavity size was normal. Wall thickness was    normal. Systolic function was normal. The estimated ejection    fraction was in the range of 60% to 65%. Wall motion was normal;    there were no regional wall motion abnormalities. Left    ventricular diastolic function parameters were normal.  - Aortic valve: Valve area (VTI): 3.78 cm^2. Valve area (Vmax):    4.28 cm^2.  - Atrial septum:  No defect or patent foramen ovale was identified.  - Pulmonary arteries: Systolic pressure was mildly increased. PA    peak pressure: 36 mm Hg (S).  - Technically adequate study.     Past Medical History:  Diagnosis Date   Anxiety    Asthma    uses albuterol as needed "mostly weather induced".ALbuterol and Ventolin inhaler as needed   Chronic back pain    Chronic neck pain     Complication of anesthesia    post-op PEA arrest 04/16/16, thought due to respriatory arrest from sedation, narcotic, hypovolemia   Degenerative disc disease    cervical and lumbar area   Depression    History of bronchitis 2016   History of cervical spinal surgery 12/30/2019   History of lumbar surgery 12/30/2019   Insomnia    takes Ambien nightly   Muscle spasm    takes Robaxin daily    Pneumonia    hx of-2016   PONV (postoperative nausea and vomiting)    Weakness    numbness and tingling in legs    Past Surgical History:  Procedure Laterality Date   ANTERIOR CERVICAL DECOMP/DISCECTOMY FUSION N/A 09/05/2021   Procedure: CERVICAL THREE-FOUR ANTERIOR CERVICAL DECOMPRESSION/DISCECTOMY FUSION WITH PARTIAL PLATE REMOVAL AT CERVICAL FOUR-FIVE;  Surgeon: Karsten Ro, DO;  Location: [place];  Service: Neurosurgery;  Laterality: N/A;  3C   BACK SURGERY     x 3 lumbar   CHOLECYSTECTOMY     DILATION AND CURETTAGE OF UTERUS     x 2   JOINT REPLACEMENT     KNEE SURGERY Left    x 2   left knee arthroscopy      x 2   POSTERIOR CERVICAL FUSION/FORAMINOTOMY  05/17/2012   Procedure: POSTERIOR CERVICAL FUSION/FORAMINOTOMY LEVEL 2;  Surgeon: Hosie Spangle, MD;  Location: [place] NEURO ORS;  Service: Neurosurgery;  Laterality: N/A;  cervical six to Thoracic One posterior cervical arthrodesis with instrumentation   POSTERIOR CERVICAL FUSION/FORAMINOTOMY N/A 06/17/2022   Procedure: CERVICAL TWO-THREE, CERVICAL THREE-FOUR, CERVICAL FOUR-FIVE POSTERIOR CERVICAL INSTRUMENTATION AND FUSION WITH EXTENSION TO CERVICAL SIX-THORACIC ONE FUSION;  Surgeon: Dawley, Theodoro Doing, DO;  Location: [place];  Service: Neurosurgery;  Laterality: N/A;   SHOULDER ARTHROSCOPY Left 10/27/2019   Procedure: left shoulder arthroscopy with arthroscopic vs open distal clavical excision;  Surgeon: Meredith Pel, MD;  Location: [place];  Service: Orthopedics;  Laterality: Left;   SHOULDER SURGERY     right   SPINE SURGERY     x 5    TOE FUSION Left 09/2016   TOTAL HIP ARTHROPLASTY Left 11/09/2018   Procedure: LEFT TOTAL HIP ARTHROPLASTY DIRECT ANTERIOR APPROACH;  Surgeon: Meredith Pel, MD;  Location: [facility];  Service: Orthopedics;  Laterality: Left;    MEDICATIONS:  albuterol (PROVENTIL) (2.5 MG/3ML) 0.083% nebulizer solution   albuterol (VENTOLIN HFA) 108 (90 Base) MCG/ACT inhaler   DULoxetine (CYMBALTA) 20 MG capsule   folic acid (FOLVITE) 1 MG tablet   gabapentin (NEURONTIN) 300 MG capsule   meloxicam (MOBIC) 7.5 MG tablet   methocarbamol (ROBAXIN) 750 MG tablet   mupirocin ointment (BACTROBAN) 2 %   ofloxacin (FLOXIN) 0.3 % OTIC solution   Omega-3 Fatty Acids (FISH OIL) 1200 MG CAPS   oxyCODONE (ROXICODONE) 5 MG immediate release tablet   Propylene Glycol (SYSTANE BALANCE) 0.6 % SOLN   zolpidem (AMBIEN) 10 MG tablet   No current facility-administered medications for this encounter.    Myra Gianotti, PA-C Surgical Short Stay/Anesthesiology [facility] Phone [phone] [facility] Phone [phone] 09/26/2022 12:56 PM

## 2022-10-06 ENCOUNTER — Telehealth: Payer: Self-pay | Admitting: *Deleted

## 2022-10-06 MED ORDER — TRANEXAMIC ACID 1000 MG/10ML IV SOLN
2000.0000 mg | INTRAVENOUS | Status: DC
  Filled 2022-10-06 (×3): qty 20

## 2022-10-06 NOTE — Care Plan (Signed)
[facility] RNCM call to patient to discuss her upcoming Right total knee arthroplasty with Dr. August Saucer on 10/07/22. She is an Ortho bundle patient through [facility] and is agreeable to case management. She lives with her spouse, who will be able to assist her after discharge. She has a home CPM, RW, and 3in1/BSC already. No DME needs. Anticipate HHPT will be needed after a short hospital stay. Referral made to [facility] after choice provided. Reviewed post op care instructions. Will continue to follow for needs.

## 2022-10-06 NOTE — Telephone Encounter (Signed)
Ortho bundle Pre-op call completed. 

## 2022-10-07 ENCOUNTER — Encounter (HOSPITAL_COMMUNITY)
Admission: RE | Disposition: A | Discharge status: Home Health | Payer: Self-pay | Source: Home / Self Care | Attending: Orthopedic Surgery

## 2022-10-07 ENCOUNTER — Observation Stay (HOSPITAL_COMMUNITY)
Admission: RE | Admit: 2022-10-07 | Discharge: 2022-10-08 | Disposition: A | Discharge status: Home Health | Payer: Medicare Other | Attending: Orthopedic Surgery | Admitting: Orthopedic Surgery

## 2022-10-07 ENCOUNTER — Other Ambulatory Visit: Payer: Self-pay

## 2022-10-07 ENCOUNTER — Ambulatory Visit (HOSPITAL_BASED_OUTPATIENT_CLINIC_OR_DEPARTMENT_OTHER): Payer: Medicare Other | Admitting: Anesthesiology

## 2022-10-07 ENCOUNTER — Ambulatory Visit (HOSPITAL_COMMUNITY): Payer: Medicare Other | Admitting: Vascular Surgery

## 2022-10-07 ENCOUNTER — Encounter (HOSPITAL_COMMUNITY): Payer: Self-pay | Admitting: Orthopedic Surgery

## 2022-10-07 DIAGNOSIS — J45909 Unspecified asthma, uncomplicated: Secondary | ICD-10-CM | POA: Diagnosis not present

## 2022-10-07 DIAGNOSIS — Z96642 Presence of left artificial hip joint: Secondary | ICD-10-CM | POA: Diagnosis not present

## 2022-10-07 DIAGNOSIS — Z96652 Presence of left artificial knee joint: Secondary | ICD-10-CM

## 2022-10-07 DIAGNOSIS — M1712 Unilateral primary osteoarthritis, left knee: Secondary | ICD-10-CM

## 2022-10-07 DIAGNOSIS — Z01818 Encounter for other preprocedural examination: Secondary | ICD-10-CM

## 2022-10-07 HISTORY — PX: TOTAL KNEE ARTHROPLASTY: SHX125

## 2022-10-07 SURGERY — ARTHROPLASTY, KNEE, TOTAL
Anesthesia: Regional | Site: Knee | Laterality: Left

## 2022-10-07 MED ORDER — METHOCARBAMOL 500 MG PO TABS
500.0000 mg | ORAL_TABLET | Freq: Four times a day (QID) | ORAL | Status: DC | PRN
  Administered 2022-10-07 – 2022-10-08 (×3): 500 mg via ORAL
  Filled 2022-10-07 (×4): qty 1

## 2022-10-07 MED ORDER — SUGAMMADEX SODIUM 200 MG/2ML IV SOLN
INTRAVENOUS | Status: DC | PRN
  Administered 2022-10-07: 200 mg via INTRAVENOUS

## 2022-10-07 MED ORDER — BUPIVACAINE LIPOSOME 1.3 % IJ SUSP
INTRAMUSCULAR | Status: AC
  Filled 2022-10-07: qty 20

## 2022-10-07 MED ORDER — ACETAMINOPHEN 500 MG PO TABS: 500.0000 mg | ORAL_TABLET | Freq: Four times a day (QID) | ORAL | Status: DC | PRN

## 2022-10-07 MED ORDER — LIDOCAINE 2% (20 MG/ML) 5 ML SYRINGE
INTRAMUSCULAR | Status: DC | PRN
  Administered 2022-10-07: 20 mg via INTRAVENOUS

## 2022-10-07 MED ORDER — POVIDONE-IODINE 10 % EX SWAB
2.0000 | Freq: Once | CUTANEOUS | Status: AC
  Administered 2022-10-07: 2 via TOPICAL

## 2022-10-07 MED ORDER — IRRISEPT - 450ML BOTTLE WITH 0.05% CHG IN STERILE WATER, USP 99.95% OPTIME
TOPICAL | Status: DC | PRN
  Administered 2022-10-07: 450 mL via TOPICAL

## 2022-10-07 MED ORDER — HYDROCODONE-ACETAMINOPHEN 5-325 MG PO TABS: 1.0000 | ORAL_TABLET | ORAL | Status: DC | PRN

## 2022-10-07 MED ORDER — POVIDONE-IODINE 7.5 % EX SOLN
Freq: Once | CUTANEOUS | Status: DC
  Filled 2022-10-07: qty 118

## 2022-10-07 MED ORDER — HYDROCODONE-ACETAMINOPHEN 5-325 MG PO TABS
1.0000 | ORAL_TABLET | ORAL | Status: DC | PRN
  Administered 2022-10-07: 1 via ORAL
  Filled 2022-10-07: qty 1

## 2022-10-07 MED ORDER — DEXAMETHASONE SODIUM PHOSPHATE 10 MG/ML IJ SOLN
INTRAMUSCULAR | Status: DC | PRN
  Administered 2022-10-07: 10 mg via INTRAVENOUS

## 2022-10-07 MED ORDER — FENTANYL CITRATE (PF) 100 MCG/2ML IJ SOLN
INTRAMUSCULAR | Status: AC
  Filled 2022-10-07: qty 2

## 2022-10-07 MED ORDER — KETAMINE HCL 10 MG/ML IJ SOLN
INTRAMUSCULAR | Status: DC | PRN
  Administered 2022-10-07: 30 mg via INTRAVENOUS
  Administered 2022-10-07 (×2): 10 mg via INTRAVENOUS

## 2022-10-07 MED ORDER — GABAPENTIN 300 MG PO CAPS
600.0000 mg | ORAL_CAPSULE | Freq: Two times a day (BID) | ORAL | Status: DC
  Administered 2022-10-07 – 2022-10-08 (×2): 600 mg via ORAL
  Filled 2022-10-07 (×2): qty 2

## 2022-10-07 MED ORDER — ONDANSETRON HCL 4 MG/2ML IJ SOLN
INTRAMUSCULAR | Status: DC | PRN
  Administered 2022-10-07: 4 mg via INTRAVENOUS

## 2022-10-07 MED ORDER — CLONIDINE HCL (ANALGESIA) 100 MCG/ML EP SOLN
EPIDURAL | Status: AC
  Filled 2022-10-07: qty 10

## 2022-10-07 MED ORDER — GABAPENTIN 300 MG PO CAPS
900.0000 mg | ORAL_CAPSULE | Freq: Every day | ORAL | Status: DC
  Administered 2022-10-07: 900 mg via ORAL
  Filled 2022-10-07: qty 3

## 2022-10-07 MED ORDER — ALBUTEROL SULFATE HFA 108 (90 BASE) MCG/ACT IN AERS: 2.0000 | INHALATION_SPRAY | Freq: Four times a day (QID) | RESPIRATORY_TRACT | Status: DC | PRN

## 2022-10-07 MED ORDER — ROCURONIUM BROMIDE 10 MG/ML (PF) SYRINGE
PREFILLED_SYRINGE | INTRAVENOUS | Status: DC | PRN
  Administered 2022-10-07: 50 mg via INTRAVENOUS
  Administered 2022-10-07: 20 mg via INTRAVENOUS

## 2022-10-07 MED ORDER — PHENYLEPHRINE HCL (PRESSORS) 10 MG/ML IV SOLN
INTRAVENOUS | Status: DC | PRN
  Administered 2022-10-07 (×2): 80 ug via INTRAVENOUS
  Administered 2022-10-07: 160 ug via INTRAVENOUS

## 2022-10-07 MED ORDER — POVIDONE-IODINE 10 % EX SWAB
2.0000 | Freq: Once | CUTANEOUS | Status: DC
  Administered 2022-10-07: 2 via TOPICAL

## 2022-10-07 MED ORDER — BUPIVACAINE HCL (PF) 0.25 % IJ SOLN
INTRAMUSCULAR | Status: AC
  Filled 2022-10-07: qty 30

## 2022-10-07 MED ORDER — ONDANSETRON HCL 4 MG/2ML IJ SOLN: 4.0000 mg | Freq: Four times a day (QID) | INTRAMUSCULAR | Status: DC | PRN

## 2022-10-07 MED ORDER — PROPOFOL 10 MG/ML IV BOLUS
INTRAVENOUS | Status: DC | PRN
  Administered 2022-10-07: 120 mg via INTRAVENOUS

## 2022-10-07 MED ORDER — PROPOFOL 10 MG/ML IV BOLUS
INTRAVENOUS | Status: AC
  Filled 2022-10-07: qty 20

## 2022-10-07 MED ORDER — TRANEXAMIC ACID 1000 MG/10ML IV SOLN
INTRAVENOUS | Status: DC | PRN
  Administered 2022-10-07: 2000 mg via TOPICAL

## 2022-10-07 MED ORDER — DOCUSATE SODIUM 100 MG PO CAPS
100.0000 mg | ORAL_CAPSULE | Freq: Two times a day (BID) | ORAL | Status: DC
  Administered 2022-10-07 – 2022-10-08 (×3): 100 mg via ORAL
  Filled 2022-10-07 (×3): qty 1

## 2022-10-07 MED ORDER — ACETAMINOPHEN 500 MG PO TABS
500.0000 mg | ORAL_TABLET | Freq: Four times a day (QID) | ORAL | Status: AC
  Administered 2022-10-07 – 2022-10-08 (×4): 500 mg via ORAL
  Filled 2022-10-07 (×4): qty 1

## 2022-10-07 MED ORDER — METOCLOPRAMIDE HCL 5 MG PO TABS: 5.0000 mg | ORAL_TABLET | Freq: Three times a day (TID) | ORAL | Status: DC | PRN

## 2022-10-07 MED ORDER — VANCOMYCIN HCL 1000 MG IV SOLR
INTRAVENOUS | Status: DC | PRN
  Administered 2022-10-07: 1000 mg via TOPICAL

## 2022-10-07 MED ORDER — MIDAZOLAM HCL 2 MG/2ML IJ SOLN
INTRAMUSCULAR | Status: DC | PRN
  Administered 2022-10-07: 2 mg via INTRAVENOUS

## 2022-10-07 MED ORDER — PHENYLEPHRINE HCL-NACL 20-0.9 MG/250ML-% IV SOLN
INTRAVENOUS | Status: DC | PRN
  Administered 2022-10-07: 50 ug/min via INTRAVENOUS

## 2022-10-07 MED ORDER — FENTANYL CITRATE (PF) 250 MCG/5ML IJ SOLN
INTRAMUSCULAR | Status: DC | PRN
  Administered 2022-10-07: 100 ug via INTRAVENOUS

## 2022-10-07 MED ORDER — MORPHINE SULFATE 4 MG/ML IJ SOLN
INTRAMUSCULAR | Status: DC | PRN
  Administered 2022-10-07: 13 mL

## 2022-10-07 MED ORDER — ALBUTEROL SULFATE (2.5 MG/3ML) 0.083% IN NEBU: 2.5000 mg | INHALATION_SOLUTION | RESPIRATORY_TRACT | Status: DC | PRN

## 2022-10-07 MED ORDER — CEFAZOLIN SODIUM-DEXTROSE 1-4 GM/50ML-% IV SOLN
1.0000 g | Freq: Three times a day (TID) | INTRAVENOUS | Status: AC
  Administered 2022-10-07 (×2): 1 g via INTRAVENOUS
  Filled 2022-10-07 (×2): qty 50

## 2022-10-07 MED ORDER — LACTATED RINGERS IV SOLN: INTRAVENOUS | Status: DC

## 2022-10-07 MED ORDER — SODIUM CHLORIDE 0.9 % IV SOLN: INTRAVENOUS | Status: AC

## 2022-10-07 MED ORDER — DEXAMETHASONE SODIUM PHOSPHATE 10 MG/ML IJ SOLN
INTRAMUSCULAR | Status: DC | PRN
  Administered 2022-10-07: 5 mg

## 2022-10-07 MED ORDER — MENTHOL 3 MG MT LOZG: 1.0000 | LOZENGE | OROMUCOSAL | Status: DC | PRN

## 2022-10-07 MED ORDER — CHLORHEXIDINE GLUCONATE 0.12 % MT SOLN
15.0000 mL | Freq: Once | OROMUCOSAL | Status: AC
  Administered 2022-10-07: 15 mL via OROMUCOSAL

## 2022-10-07 MED ORDER — ORAL CARE MOUTH RINSE: 15.0000 mL | Freq: Once | OROMUCOSAL | Status: AC

## 2022-10-07 MED ORDER — MIDAZOLAM HCL 2 MG/2ML IJ SOLN
INTRAMUSCULAR | Status: AC
  Filled 2022-10-07: qty 2

## 2022-10-07 MED ORDER — PHENOL 1.4 % MT LIQD: 1.0000 | OROMUCOSAL | Status: DC | PRN

## 2022-10-07 MED ORDER — MORPHINE SULFATE (PF) 4 MG/ML IV SOLN
INTRAVENOUS | Status: AC
  Filled 2022-10-07: qty 2

## 2022-10-07 MED ORDER — ROPIVACAINE HCL 5 MG/ML IJ SOLN
INTRAMUSCULAR | Status: DC | PRN
  Administered 2022-10-07: 20 mL via PERINEURAL

## 2022-10-07 MED ORDER — METOCLOPRAMIDE HCL 5 MG/ML IJ SOLN: 5.0000 mg | Freq: Three times a day (TID) | INTRAMUSCULAR | Status: DC | PRN

## 2022-10-07 MED ORDER — CEFAZOLIN SODIUM-DEXTROSE 2-4 GM/100ML-% IV SOLN
2.0000 g | INTRAVENOUS | Status: AC
  Administered 2022-10-07: 2 g via INTRAVENOUS
  Filled 2022-10-07: qty 100

## 2022-10-07 MED ORDER — DULOXETINE HCL 20 MG PO CPEP
20.0000 mg | ORAL_CAPSULE | Freq: Every day | ORAL | Status: DC
  Administered 2022-10-08: 20 mg via ORAL
  Filled 2022-10-07: qty 1

## 2022-10-07 MED ORDER — NAPROXEN 250 MG PO TABS
250.0000 mg | ORAL_TABLET | Freq: Two times a day (BID) | ORAL | Status: DC
  Administered 2022-10-07 – 2022-10-08 (×2): 250 mg via ORAL
  Filled 2022-10-07 (×2): qty 1

## 2022-10-07 MED ORDER — SODIUM CHLORIDE (PF) 0.9 % IJ SOLN
INTRAMUSCULAR | Status: DC | PRN
  Administered 2022-10-07: 60 mL

## 2022-10-07 MED ORDER — MORPHINE SULFATE (PF) 2 MG/ML IV SOLN
0.5000 mg | INTRAVENOUS | Status: DC | PRN
  Administered 2022-10-07 – 2022-10-08 (×5): 0.5 mg via INTRAVENOUS
  Filled 2022-10-07 (×6): qty 1

## 2022-10-07 MED ORDER — SODIUM CHLORIDE 0.9 % IR SOLN
Status: DC | PRN
  Administered 2022-10-07: 3000 mL

## 2022-10-07 MED ORDER — ACETAMINOPHEN 500 MG PO TABS
1000.0000 mg | ORAL_TABLET | Freq: Once | ORAL | Status: AC
  Administered 2022-10-07: 1000 mg via ORAL
  Filled 2022-10-07: qty 2

## 2022-10-07 MED ORDER — 0.9 % SODIUM CHLORIDE (POUR BTL) OPTIME
TOPICAL | Status: DC | PRN
  Administered 2022-10-07 (×4): 1000 mL

## 2022-10-07 MED ORDER — OXYCODONE HCL 5 MG PO TABS
5.0000 mg | ORAL_TABLET | ORAL | Status: DC | PRN
  Administered 2022-10-08: 10 mg via ORAL
  Administered 2022-10-08: 5 mg via ORAL
  Filled 2022-10-07: qty 1
  Filled 2022-10-07: qty 2

## 2022-10-07 MED ORDER — TRANEXAMIC ACID-NACL 1000-0.7 MG/100ML-% IV SOLN
1000.0000 mg | INTRAVENOUS | Status: AC
  Administered 2022-10-07: 1000 mg via INTRAVENOUS
  Filled 2022-10-07: qty 100

## 2022-10-07 MED ORDER — FENTANYL CITRATE (PF) 100 MCG/2ML IJ SOLN
25.0000 ug | INTRAMUSCULAR | Status: DC | PRN
  Administered 2022-10-07 (×2): 25 ug via INTRAVENOUS

## 2022-10-07 MED ORDER — ASPIRIN 81 MG PO CHEW
81.0000 mg | CHEWABLE_TABLET | Freq: Two times a day (BID) | ORAL | Status: DC
  Administered 2022-10-07 – 2022-10-08 (×2): 81 mg via ORAL
  Filled 2022-10-07 (×2): qty 1

## 2022-10-07 MED ORDER — SCOPOLAMINE 1 MG/3DAYS TD PT72
MEDICATED_PATCH | TRANSDERMAL | Status: AC
  Filled 2022-10-07: qty 1

## 2022-10-07 MED ORDER — VANCOMYCIN HCL 1000 MG IV SOLR
INTRAVENOUS | Status: AC
  Filled 2022-10-07: qty 20

## 2022-10-07 MED ORDER — ONDANSETRON HCL 4 MG PO TABS: 4.0000 mg | ORAL_TABLET | Freq: Four times a day (QID) | ORAL | Status: DC | PRN

## 2022-10-07 MED ORDER — FENTANYL CITRATE (PF) 250 MCG/5ML IJ SOLN
INTRAMUSCULAR | Status: AC
  Filled 2022-10-07: qty 5

## 2022-10-07 MED ORDER — METHOCARBAMOL 1000 MG/10ML IJ SOLN: 500.0000 mg | Freq: Four times a day (QID) | INTRAVENOUS | Status: DC | PRN

## 2022-10-07 MED ORDER — KETAMINE HCL 50 MG/5ML IJ SOSY
PREFILLED_SYRINGE | INTRAMUSCULAR | Status: AC
  Filled 2022-10-07: qty 5

## 2022-10-07 SURGICAL SUPPLY — 99 items
BAG COUNTER SPONGE SURGICOUNT (BAG) ×2 IMPLANT
BAG DECANTER FOR FLEXI CONT (MISCELLANEOUS) ×2 IMPLANT
BAG SPNG CNTER NS LX DISP (BAG) ×2
BANDAGE ESMARK 6X9 LF (GAUZE/BANDAGES/DRESSINGS) ×2 IMPLANT
BLADE SAG 18X100X1.27 (BLADE) ×2 IMPLANT
BLADE SAGITTAL (BLADE) ×1
BLADE SAW THK.89X75X18XSGTL (BLADE) ×2 IMPLANT
BNDG CMPR 5X6 CHSV STRCH STRL (GAUZE/BANDAGES/DRESSINGS) ×1
BNDG CMPR 9X6 STRL LF SNTH (GAUZE/BANDAGES/DRESSINGS) ×1
BNDG CMPR MED 15X6 ELC VLCR LF (GAUZE/BANDAGES/DRESSINGS) ×1
BNDG COHESIVE 6X5 TAN ST LF (GAUZE/BANDAGES/DRESSINGS) ×2 IMPLANT
BNDG ELASTIC 6X15 VLCR STRL LF (GAUZE/BANDAGES/DRESSINGS) ×2 IMPLANT
BNDG ESMARK 6X9 LF (GAUZE/BANDAGES/DRESSINGS) ×1
BOWL SMART MIX CTS (DISPOSABLE) IMPLANT
CEMENT BONE SIMPLEX SPEEDSET (Cement) IMPLANT
CNTNR URN SCR LID CUP LEK RST (MISCELLANEOUS) ×2 IMPLANT
COMP FEM CEMT PERSONA STD SZ7 (Knees) ×1 IMPLANT
COMP TIB PS KNEE E 0D LT (Joint) ×1 IMPLANT
COMPONENT FEM CMT PRSN STD SZ7 (Knees) IMPLANT
COMPONET TIB PS KNEE E 0D LT (Joint) IMPLANT
CONT SPEC 4OZ STRL OR WHT (MISCELLANEOUS) ×1
COOLER ICEMAN CLASSIC (MISCELLANEOUS) IMPLANT
COVER SURGICAL LIGHT HANDLE (MISCELLANEOUS) ×2 IMPLANT
CUFF TOURN SGL QUICK 24 (TOURNIQUET CUFF)
CUFF TOURN SGL QUICK 34 (TOURNIQUET CUFF) ×1
CUFF TOURN SGL QUICK 42 (TOURNIQUET CUFF) IMPLANT
CUFF TRNQT CYL 24X4X40X1 (TOURNIQUET CUFF) IMPLANT
CUFF TRNQT CYL 34X4.125X (TOURNIQUET CUFF) ×2 IMPLANT
DRAPE IMP U-DRAPE 54X76 (DRAPES) IMPLANT
DRAPE INCISE IOBAN 66X45 STRL (DRAPES) IMPLANT
DRAPE ORTHO SPLIT 77X108 STRL (DRAPES) ×3
DRAPE SURG ORHT 6 SPLT 77X108 (DRAPES) ×6 IMPLANT
DRAPE U-SHAPE 47X51 STRL (DRAPES) ×2 IMPLANT
DRSG AQUACEL AG ADV 3.5X14 (GAUZE/BANDAGES/DRESSINGS) IMPLANT
DURAPREP 26ML APPLICATOR (WOUND CARE) ×4 IMPLANT
ELECT BLADE 4.0 EZ CLEAN MEGAD (MISCELLANEOUS) ×1
ELECT CAUTERY BLADE 6.4 (BLADE) ×2 IMPLANT
ELECT REM PT RETURN 9FT ADLT (ELECTROSURGICAL) ×1
ELECTRODE BLDE 4.0 EZ CLN MEGD (MISCELLANEOUS) IMPLANT
ELECTRODE REM PT RTRN 9FT ADLT (ELECTROSURGICAL) ×2 IMPLANT
GAUZE SPONGE 4X4 12PLY STRL (GAUZE/BANDAGES/DRESSINGS) ×2 IMPLANT
GLOVE BIOGEL PI IND STRL 7.0 (GLOVE) ×2 IMPLANT
GLOVE BIOGEL PI IND STRL 8 (GLOVE) ×2 IMPLANT
GLOVE ECLIPSE 7.0 STRL STRAW (GLOVE) ×2 IMPLANT
GLOVE ECLIPSE 8.0 STRL XLNG CF (GLOVE) ×2 IMPLANT
GLOVE SURG ENC MOIS LTX SZ6.5 (GLOVE) ×6 IMPLANT
GOWN STRL REUS W/ TWL LRG LVL3 (GOWN DISPOSABLE) ×6 IMPLANT
GOWN STRL REUS W/TWL LRG LVL3 (GOWN DISPOSABLE) ×3
HANDPIECE INTERPULSE COAX TIP (DISPOSABLE) ×1
HDLS TROCR DRIL PIN KNEE 75 (PIN) ×1
HOOD PEEL AWAY T7 (MISCELLANEOUS) ×6 IMPLANT
IMMOBILIZER KNEE 20 (SOFTGOODS)
IMMOBILIZER KNEE 20 THIGH 36 (SOFTGOODS) IMPLANT
IMMOBILIZER KNEE 22 UNIV (SOFTGOODS) IMPLANT
IMMOBILIZER KNEE 24 THIGH 36 (MISCELLANEOUS) IMPLANT
IMMOBILIZER KNEE 24 UNIV (MISCELLANEOUS)
KIT BASIN OR (CUSTOM PROCEDURE TRAY) ×2 IMPLANT
KIT TURNOVER KIT B (KITS) ×2 IMPLANT
MANIFOLD NEPTUNE II (INSTRUMENTS) ×2 IMPLANT
NDL 18GX1X1/2 (RX/OR ONLY) (NEEDLE) IMPLANT
NDL 22X1.5 STRL (OR ONLY) (MISCELLANEOUS) ×4 IMPLANT
NDL SPNL 18GX3.5 QUINCKE PK (NEEDLE) ×2 IMPLANT
NEEDLE 18GX1X1/2 (RX/OR ONLY) (NEEDLE) ×1 IMPLANT
NEEDLE 22X1.5 STRL (OR ONLY) (MISCELLANEOUS) ×2 IMPLANT
NEEDLE SPNL 18GX3.5 QUINCKE PK (NEEDLE) ×1 IMPLANT
NS IRRIG 1000ML POUR BTL (IV SOLUTION) ×4 IMPLANT
PACK TOTAL JOINT (CUSTOM PROCEDURE TRAY) ×2 IMPLANT
PAD ARMBOARD 7.5X6 YLW CONV (MISCELLANEOUS) ×4 IMPLANT
PAD CAST 4YDX4 CTTN HI CHSV (CAST SUPPLIES) ×2 IMPLANT
PAD COLD SHLDR WRAP-ON (PAD) IMPLANT
PADDING CAST COTTON 4X4 STRL (CAST SUPPLIES)
PADDING CAST COTTON 6X4 STRL (CAST SUPPLIES) ×2 IMPLANT
PIN DRILL HDLS TROCAR 75 4PK (PIN) IMPLANT
SCREW FEMALE HEX FIX 25X2.5 (ORTHOPEDIC DISPOSABLE SUPPLIES) IMPLANT
SET HNDPC FAN SPRY TIP SCT (DISPOSABLE) ×2 IMPLANT
SPIKE FLUID TRANSFER (MISCELLANEOUS) ×2 IMPLANT
SPONGE T-LAP 18X18 ~~LOC~~+RFID (SPONGE) IMPLANT
STEM MED AS PERS SZ6-7 11 (Stem) IMPLANT
STEM POLY PAT PLY 35M KNEE (Knees) IMPLANT
STRIP CLOSURE SKIN 1/2X4 (GAUZE/BANDAGES/DRESSINGS) ×4 IMPLANT
SUCTION FRAZIER HANDLE 10FR (MISCELLANEOUS) ×1
SUCTION TUBE FRAZIER 10FR DISP (MISCELLANEOUS) ×2 IMPLANT
SUT MNCRL AB 3-0 PS2 18 (SUTURE) ×2 IMPLANT
SUT VIC AB 0 CT1 27 (SUTURE) ×3
SUT VIC AB 0 CT1 27XBRD ANBCTR (SUTURE) ×6 IMPLANT
SUT VIC AB 0 CT1 36 (SUTURE) IMPLANT
SUT VIC AB 1 CT1 27 (SUTURE) ×3
SUT VIC AB 1 CT1 27XBRD ANBCTR (SUTURE) IMPLANT
SUT VIC AB 1 CT1 36 (SUTURE) ×10 IMPLANT
SUT VIC AB 2-0 CT1 27 (SUTURE) ×4
SUT VIC AB 2-0 CT1 TAPERPNT 27 (SUTURE) ×8 IMPLANT
SYR 30ML LL (SYRINGE) ×6 IMPLANT
SYR 5ML LL (SYRINGE) IMPLANT
SYR TB 1ML LUER SLIP (SYRINGE) ×2 IMPLANT
TOWEL GREEN STERILE (TOWEL DISPOSABLE) ×4 IMPLANT
TOWEL GREEN STERILE FF (TOWEL DISPOSABLE) ×4 IMPLANT
TRAY CATH INTERMITTENT SS 16FR (CATHETERS) IMPLANT
WATER STERILE IRR 1000ML POUR (IV SOLUTION) IMPLANT
YANKAUER SUCT BULB TIP NO VENT (SUCTIONS) ×2 IMPLANT

## 2022-10-07 NOTE — Transfer of Care (Signed)
Immediate Anesthesia Transfer of Care Note  Patient: Stephanie Noble  Procedure(s) Performed: LEFT TOTAL KNEE ARTHROPLASTY (Left: Knee)  Patient Location: PACU  Anesthesia Type:General  Level of Consciousness: sedated and drowsy  Airway & Oxygen Therapy: Patient Spontanous Breathing and Patient connected to face mask oxygen  Post-op Assessment: Report given to RN, Post -op Vital signs reviewed and stable, and Patient moving all extremities  Post vital signs: Reviewed and stable  Last Vitals:  Vitals Value Taken Time  BP 108/69 10/07/22 1041  Temp    Pulse 78 10/07/22 1044  Resp 8 10/07/22 1044  SpO2 100 % 10/07/22 1044  Vitals shown include unvalidated device data.  Last Pain:  Vitals:   10/07/22 0628  TempSrc:   PainSc: 9       Patients Stated Pain Goal: 0 (10/07/22 0100)  Complications: No notable events documented.

## 2022-10-07 NOTE — Op Note (Signed)
NAME: Stephanie Noble, Stephanie Noble MEDICAL RECORD NO: [id number] ACCOUNT NO: [id number] DATE OF BIRTH: August 05, 1955 FACILITY: [facility] LOCATION: [facility] PHYSICIAN: Graylin Shiver. August Saucer, MD  Operative Report   DATE OF PROCEDURE: 10/07/2022  PREOPERATIVE DIAGNOSIS:  Left knee arthritis.  POSTOPERATIVE DIAGNOSIS:  Left knee arthritis.  PROCEDURE:  Left total knee replacement using Persona cemented cruciate retaining components, size 7 femur, size E cemented tibial keel and stem, 35 mm x 9 mm thickness, cemented all poly patella with 11 mm medial congruent polyethylene insert.  SURGEON:  Graylin Shiver. August Saucer, MD  ASSISTANT:  Karenann Cai.  INDICATIONS:  This is a 67 year old patient with left knee pain, who presents for operative management after explanation of risks and benefits.  She has failed conservative management.  DESCRIPTION OF PROCEDURE:  The patient was brought to the operating room where general anesthetic was induced.  Preoperative antibiotics administered.  Timeout was called.  Left leg was prescrubbed with alcohol and Betadine, allowed to air dry, prepped  with DuraPrep solution and draped in sterile manner.  Ioban used to cover the operative field.  Leg was elevated and exsanguinated with Esmarch wrap.  Tourniquet was inflated.  Timeout was called.  Anterior approach to the knee was made.  IrriSept  solution utilized.  Median parapatellar arthrotomy was made and marked with #1 Vicryl suture.  Severe tricompartmental arthritis was present.  Fat pad partially excised.  Anterior horn lateral meniscus resected and the ACL was released.  Osteophytes were  removed.  With the leg in extension lateral patellofemoral ligament was released and soft tissue was removed from the anterior distal femur.  Then, using intramedullary alignment, a cut was made 10 mm off the least affected lateral tibial plateau with  the collaterals and posterior neurovascular structures protected.  10 mm cut was then made off the distal  femur cut in 5 degrees of valgus.  A 10 mm spacer fit well with a slight hyperextension.  Next, the femur sized to a size 7 standard, femur was cut  in 3 degrees of external rotation, which gave symmetric flexion and extension gaps.  Anterior, posterior and chamfer cuts were then made.  Tibia was then keel punched with the rotational alignment in line with the medial third of the tibial tubercle.   With trial components in position the patient had full extension, excellent flexion and the patella was then cut down from 19 to 10 mm.  Patella itself was very thin.  3-peg patella trial was placed and the patient had excellent range of motion with good  stability to varus and valgus stress at 0, 30 and 90 degrees and excellent patellar tracking.  Trial components were removed after the tibia was keel punched and final preparations were made on the femur.  Three liters of pulsatile irrigation were  utilized.  TXA sponge was then allowed to sit for 3 minutes in the knee along with IrriSept solution.  Next, the bony surfaces were dried.  Bone plug placed in both the tibia and the femur.  Vancomycin powder placed in the tibial canal.  The components  were then cemented into position with excess cement removed.  Cement hardening was allowed to occur, 11 mm spacer was chosen and that was placed with good extension, good flexion and no liftoff.  Tourniquet was released.  Bleeding points encountered controlled using electrocautery. 3 liters of pouring irrigation utilized at this time.  Next, the arthrotomy was closed using #1 Vicryl suture followed by interrupted inverted 0 Vicryl suture, 2-0  Vicryl suture, and 3-0 Monocryl with Steri-Strips applied.  It should be noted that we did place Marcaine, Exparel and saline solution into the capsule prior to cementing the components and we also placed a solution of Marcaine, morphine and clonidine  into the knee to final arthrotomy closure.  We also irrigated with IrriSept  just before placing the vancomycin powder into the knee joint and that was just before final arthrotomy closure.  Next Aquacel dressing placed along with iceman and knee  immobilizer.  The patient was then transferred to the recovery room in stable condition.  [name]'s assistance was required at all times for retraction, opening, closing, mobilization of tissue.  His assistance was a medical necessity.   PUS D: 10/07/2022 2:22:50 pm T: 10/07/2022 2:40:00 pm  JOB: [id number]/ [id number]

## 2022-10-07 NOTE — Anesthesia Procedure Notes (Signed)
Anesthesia Regional Block: Adductor canal block   Pre-Anesthetic Checklist: , timeout performed,  Correct Patient, Correct Site, Correct Laterality,  Correct Procedure, Correct Position, site marked,  Risks and benefits discussed,  Pre-op evaluation,  At surgeon's request and post-op pain management  Laterality: Left  Prep: Maximum Sterile Barrier Precautions used, chloraprep       Needles:  Injection technique: Single-shot  Needle Type: Echogenic Stimulator Needle     Needle Length: 9cm  Needle Gauge: 21     Additional Needles:   Procedures:,,,, ultrasound used (permanent image in chart),,    Narrative:  Start time: 10/07/2022 7:15 AM End time: 10/07/2022 7:19 AM Injection made incrementally with aspirations every 5 mL. Anesthesiologist: Elmer Picker, MD

## 2022-10-07 NOTE — Anesthesia Procedure Notes (Signed)
Procedure Name: Intubation Date/Time: 10/07/2022 7:50 AM  Performed by: Wendi Snipes, CRNAPre-anesthesia Checklist: Patient identified, Emergency Drugs available, Suction available and Patient being monitored Patient Re-evaluated:Patient Re-evaluated prior to induction Oxygen Delivery Method: Circle System Utilized Preoxygenation: Pre-oxygenation with 100% oxygen Induction Type: IV induction Ventilation: Mask ventilation without difficulty Laryngoscope Size: Mac and 3 Grade View: Grade I Tube type: Oral Tube size: 7.0 mm Number of attempts: 1 Airway Equipment and Method: Stylet and Oral airway Placement Confirmation: ETT inserted through vocal cords under direct vision, positive ETCO2 and breath sounds checked- equal and bilateral Secured at: 22 cm Tube secured with: Tape Dental Injury: Teeth and Oropharynx as per pre-operative assessment

## 2022-10-07 NOTE — Brief Op Note (Signed)
   10/07/2022  2:16 PM  PATIENT:  Stephanie Noble  67 y.o. female  PRE-OPERATIVE DIAGNOSIS:  left knee osteoarthritis  POST-OPERATIVE DIAGNOSIS:  left knee osteoarthritis  PROCEDURE:  Procedure(s): LEFT TOTAL KNEE ARTHROPLASTY  SURGEON:  Surgeon(s): Cammy Copa, MD  ASSISTANT: Karenann Cai, PA  ANESTHESIA:   General  EBL: 75 ml    Total I/O In: 800 [I.V.:800] Out: 20 [Blood:20]  BLOOD ADMINISTERED: none  DRAINS: None  LOCAL MEDICATIONS USED: Marcaine morphine clonidine Exparel vancomycin powder  SPECIMEN:  No Specimen  COUNTS:  YES  TOURNIQUET:   Total Tourniquet Time Documented: Thigh (Left) - 84 minutes Total: Thigh (Left) - 84 minutes   DICTATION: .Other Dictation: Dictation Number [id number]  PLAN OF CARE: Admit for overnight observation  PATIENT DISPOSITION:  PACU - hemodynamically stable

## 2022-10-07 NOTE — H&P (Signed)
TOTAL KNEE ADMISSION H&P  Patient is being admitted for left total knee arthroplasty.  Subjective:  Chief Complaint:left knee pain.  HPI: Stephanie CostainJennifer L Noble, 67 y.o. female, has a history of pain and functional disability in the left knee due to arthritis and has failed non-surgical conservative treatments for greater than 12 weeks to includeNSAID's and/or analgesics, corticosteriod injections, viscosupplementation injections, flexibility and strengthening excercises, and activity modification.  Onset of symptoms was gradual, starting 8 years ago with gradually worsening course since that time. The patient noted prior procedures on the knee to include  arthroscopy and menisectomy on the left knee(s).  Patient currently rates pain in the left knee(s) at 8 out of 10 with activity. Patient has night pain, worsening of pain with activity and weight bearing, pain that interferes with activities of daily living, pain with passive range of motion, crepitus, and joint swelling.  Patient has evidence of subchondral sclerosis and joint space narrowing by imaging studies. This patient has had  a good result from hip replacement . There is no active infection.  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   Hip arthritis 11/09/2018   Lumbar post-laminectomy syndrome 08/12/2017   Pain in thoracic spine 05/13/2017   PTSD (post-traumatic stress disorder) 04/13/2017   Hallux rigidus, left foot 09/15/2016   Hallux  rigidus, right foot 09/15/2016   HNP (herniated nucleus pulposus), lumbar 04/16/2016   Acquired scoliosis 09/07/2015   Cervical spondylosis with radiculopathy 06/22/2015   Dysphagia 04/26/2014   Insomnia 01/04/2013   Past Medical History:  Diagnosis Date   Anxiety    Asthma    uses albuterol as needed "mostly weather induced".ALbuterol and Ventolin inhaler as needed   Chronic back pain    Chronic neck pain    Complication of anesthesia    post-op PEA arrest 04/16/16, thought due to respriatory arrest from sedation, narcotic, hypovolemia   Degenerative disc disease    cervical and lumbar area   Depression    History of bronchitis 2016   History of cervical spinal surgery 12/30/2019   History of lumbar surgery 12/30/2019   Insomnia    takes Ambien nightly   Muscle spasm    takes Robaxin daily    Pneumonia    hx of-2016   PONV (postoperative nausea and vomiting)    Weakness    numbness and tingling in legs    Past Surgical History:  Procedure Laterality Date   ANTERIOR CERVICAL DECOMP/DISCECTOMY FUSION N/A 09/05/2021   Procedure: CERVICAL THREE-FOUR ANTERIOR CERVICAL DECOMPRESSION/DISCECTOMY FUSION WITH PARTIAL PLATE REMOVAL AT CERVICAL FOUR-FIVE;  Surgeon: Bethann Gooawley, Troy C, DO;  Location: [facility] OR;  Service: Neurosurgery;  Laterality: N/A;  3C   BACK SURGERY     x 3 lumbar   CHOLECYSTECTOMY     DILATION AND CURETTAGE OF UTERUS     x 2   JOINT REPLACEMENT     KNEE SURGERY Left    x 2   left knee arthroscopy      x 2  POSTERIOR CERVICAL FUSION/FORAMINOTOMY  05/17/2012   Procedure: POSTERIOR CERVICAL FUSION/FORAMINOTOMY LEVEL 2;  Surgeon: Hewitt Shorts, MD;  Location: [facility];  Service: Neurosurgery;  Laterality: N/A;  cervical six to Thoracic One posterior cervical arthrodesis with instrumentation   POSTERIOR CERVICAL FUSION/FORAMINOTOMY N/A 06/17/2022   Procedure: CERVICAL TWO-THREE, CERVICAL THREE-FOUR, CERVICAL FOUR-FIVE POSTERIOR CERVICAL INSTRUMENTATION AND FUSION  WITH EXTENSION TO CERVICAL SIX-THORACIC ONE FUSION;  Surgeon: Dawley, Alan Mulder, DO;  Location: [facility] OR;  Service: Neurosurgery;  Laterality: N/A;   SHOULDER ARTHROSCOPY Left 10/27/2019   Procedure: left shoulder arthroscopy with arthroscopic vs open distal clavical excision;  Surgeon: Cammy Copa, MD;  Location: [facility] OR;  Service: Orthopedics;  Laterality: Left;   SHOULDER SURGERY     right   SPINE SURGERY     x 5   TOE FUSION Left 09/2016   TOTAL HIP ARTHROPLASTY Left 11/09/2018   Procedure: LEFT TOTAL HIP ARTHROPLASTY DIRECT ANTERIOR APPROACH;  Surgeon: Cammy Copa, MD;  Location: [facility] ORS;  Service: Orthopedics;  Laterality: Left;    Current Facility-Administered Medications  Medication Dose Route Frequency Provider Last Rate Last Admin   acetaminophen (TYLENOL) tablet 1,000 mg  1,000 mg Oral Once Woodrum, Chelsey L, MD       ceFAZolin (ANCEF) IVPB 2g/100 mL premix  2 g Intravenous On Call to OR Magnant, Charles L, PA-C       povidone-iodine (BETADINE) 7.5 % scrub   Topical Once Magnant, Charles L, PA-C       povidone-iodine 10 % swab 2 Application  2 Application Topical Once Magnant, Charles L, PA-C       povidone-iodine 10 % swab 2 Application  2 Application Topical Once Magnant, Charles L, PA-C       tranexamic acid (CYKLOKAPRON) 2,000 mg in sodium chloride 0.9 % 50 mL Topical Application  2,000 mg Topical To OR Magnant, Charles L, PA-C       tranexamic acid (CYKLOKAPRON) IVPB 1,000 mg  1,000 mg Intravenous To OR Magnant, Charles L, PA-C       Allergies  Allergen Reactions   Vistaril [Hydroxyzine Hcl] Other (See Comments)    Pt went into cardiac arrest    Hydromorphone Hcl Hives    Per report from PACU RN, pt had itching on arrival to pacu then developed hives after dilaudid given. Discussed with Dr. Jake Samples and dilaudid allergy being added to record    Dilaudid [Hydromorphone] Rash    Social History   Tobacco Use   Smoking status: Never   Smokeless tobacco: Never   Substance Use Topics   Alcohol use: No    Family History  Problem Relation Age of Onset   Dementia Mother    Other Sister    Breast cancer Sister    Alcohol abuse Other    Breast cancer Paternal Aunt      Review of Systems  Musculoskeletal:  Positive for arthralgias.  All other systems reviewed and are negative.   Objective:  Physical Exam Vitals reviewed.  HENT:     Head: Normocephalic.     Nose: Nose normal.     Mouth/Throat:     Mouth: Mucous membranes are moist.  Eyes:     Pupils: Pupils are equal, round, and reactive to light.  Cardiovascular:     Rate and Rhythm: Normal rate.     Pulses: Normal pulses.  Pulmonary:     Effort: Pulmonary effort is normal.  Abdominal:     General: Abdomen is flat.  Skin:    General: Skin is warm.     Capillary Refill: Capillary refill takes less than 2 seconds.  Neurological:     General: No focal deficit present.     Mental Status: She is alert.  Psychiatric:        Mood and Affect: Mood normal.    Ortho exam demonstrates palpable pedal pulses on the left.  No groin pain on the left with internal/external rotation of the leg.  Extensor mechanism intact.  Collateral crucial ligaments are stable.  Skin is intact in that left knee region.  Rom 5 - 110 Vital signs in last 24 hours: Temp:  [98.4 F (36.9 C)] 98.4 F (36.9 C) (04/09 0621) Pulse Rate:  [65] 65 (04/09 0621) Resp:  [16] 16 (04/09 0621) BP: (115)/(75) 115/75 (04/09 0621) SpO2:  [100 %] 100 % (04/09 0621) Weight:  [65.8 kg] 65.8 kg (04/09 0621)  Labs:   Estimated body mass index is 24.13 kg/m as calculated from the following:   Height as of this encounter: 5\' 5"  (1.651 m).   Weight as of this encounter: 65.8 kg.   Imaging Review Plain radiographs demonstrate severe degenerative joint disease of the left knee(s). The overall alignment ismild varus. The bone quality appears to be good for age and reported activity level.      Assessment/Plan:  End  stage arthritis, left knee   The patient history, physical examination, clinical judgment of the provider and imaging studies are consistent with end stage degenerative joint disease of the left knee(s) and total knee arthroplasty is deemed medically necessary. The treatment options including medical management, injection therapy arthroscopy and arthroplasty were discussed at length. The risks and benefits of total knee arthroplasty were presented and reviewed. The risks due to aseptic loosening, infection, stiffness, patella tracking problems, thromboembolic complications and other imponderables were discussed. The patient acknowledged the explanation, agreed to proceed with the plan and consent was signed. Patient is being admitted for inpatient treatment for surgery, pain control, PT, OT, prophylactic antibiotics, VTE prophylaxis, progressive ambulation and ADL's and discharge planning. The patient is planning to be discharged home with home health services no personal or family history of dvt or pe.     Patient's anticipated LOS is less than 2 midnights, meeting these requirements: - Younger than 25 - Lives within 1 hour of care - Has a competent adult at home to recover with post-op recover - NO history of  - Chronic pain requiring opiods  - Diabetes  - Coronary Artery Disease  - Heart failure  - Heart attack  - Stroke  - DVT/VTE  - Cardiac arrhythmia  - Respiratory Failure/COPD  - Renal failure  - Anemia  - Advanced Liver disease

## 2022-10-07 NOTE — Evaluation (Addendum)
Physical Therapy Evaluation Patient Details Name: Stephanie Noble MRN: [id number] DOB: [birth year] Today's Date: 10/07/2022  History of Present Illness  Patient is a 67 y/o female who presents 4/9 for left TKA. PMH includes asthma, depression and multiple surgeries re: hip, neck, back.  Clinical Impression  Patient presents with pain and post surgical deficits s/p above surgery. Pt lives at home with her spouse and is independent for ADLs/IADLs and walking at baseline. Pt loves to work in the yard and chase around her 10 grandchildren. Today, pt tolerated transfers and gait training with Min guard assist and use of RW for support. Reports impaired sensation LLE so kept KI donned for safety. Reports 10/10 pain despite being given IV morphine before session. RN aware. Education re: knee precautions, zero degree knee, there ex, positioning etc. Will have support of spouse at home. Declines any follow up therapy services. Will plan for stair training, gait training and HEP handout tomorrow prior to d/c home.       Recommendations for follow up therapy are one component of a multi-disciplinary discharge planning process, led by the attending physician.  Recommendations may be updated based on patient status, additional functional criteria and insurance authorization.  Follow Up Recommendations       Assistance Recommended at Discharge PRN  Patient can return home with the following  Help with stairs or ramp for entrance;Assistance with cooking/housework;Assist for transportation;A little help with walking and/or transfers;A little help with bathing/dressing/bathroom    Equipment Recommendations None recommended by PT  Recommendations for Other Services       Functional Status Assessment Patient has had a recent decline in their functional status and demonstrates the ability to make significant improvements in function in a reasonable and predictable amount of time.     Precautions /  Restrictions Precautions Precautions: Knee Precaution Booklet Issued: No Precaution Comments: Reviewed no pillow under knee Required Braces or Orthoses: Knee Immobilizer - Left Restrictions Weight Bearing Restrictions: Yes LLE Weight Bearing: Weight bearing as tolerated      Mobility  Bed Mobility Overal bed mobility: Needs Assistance Bed Mobility: Supine to Sit, Sit to Supine     Supine to sit: Supervision Sit to supine: Supervision   General bed mobility comments: Able to get to EOB and return to supine with supervision, increased time. No dizziness.    Transfers Overall transfer level: Needs assistance Equipment used: Rolling walker (2 wheels) Transfers: Sit to/from Stand Sit to Stand: Min guard           General transfer comment: Originally, pt asking to hook arms with spouse to help with standing but provided hand placement cues and technique and pt able to stand with Min guard and use of RW. Declined transfer to chair until she had further pain meds.    Ambulation/Gait Ambulation/Gait assistance: Min guard Gait Distance (Feet): 75 Feet Assistive device: Rolling walker (2 wheels) Gait Pattern/deviations: Step-through pattern, Decreased stance time - left, Decreased step length - right, Trunk flexed Gait velocity: decreased Gait velocity interpretation: <1.8 ft/sec, indicate of risk for recurrent falls   General Gait Details: Slow, mostly steady gait with use of RW for support. Impaired sensation LLE. No buckling but wearing KI  Stairs            Wheelchair Mobility    Modified Rankin (Stroke Patients Only)       Balance Overall balance assessment: Mild deficits observed, not formally tested  Pertinent Vitals/Pain Pain Assessment Pain Assessment: 0-10 Pain Score: 10-Worst pain ever Pain Location: left knee Pain Descriptors / Indicators: Sore, Operative site guarding, Discomfort Pain  Intervention(s): Monitored during session, Repositioned, Premedicated before session, Patient requesting pain meds-RN notified    Home Living Family/patient expects to be discharged to:: Private residence Living Arrangements: Spouse/significant other Available Help at Discharge: Family;Available 24 hours/day Type of Home: House Home Access: Stairs to enter Entrance Stairs-Rails: Doctor, general practice of Steps: 5   Home Layout: Two level;Able to live on main level with bedroom/bathroom Home Equipment: Rolling Walker (2 wheels);Cane - single point;BSC/3in1;Shower seat      Prior Function Prior Level of Function : Independent/Modified Independent             Mobility Comments: independent, drives, likes to play with 10 grandchildren and garden ADLs Comments: independent     Hand Dominance   Dominant Hand: Right    Extremity/Trunk Assessment   Upper Extremity Assessment Upper Extremity Assessment: Defer to OT evaluation    Lower Extremity Assessment Lower Extremity Assessment: LLE deficits/detail LLE Deficits / Details: Impaired sensation at knee and thigh due to block, KI donned for mobility LLE Sensation: decreased light touch    Cervical / Trunk Assessment Cervical / Trunk Assessment: Normal  Communication   Communication: No difficulties  Cognition Arousal/Alertness: Awake/alert Behavior During Therapy: Anxious Overall Cognitive Status: Within Functional Limits for tasks assessed                                          General Comments General comments (skin integrity, edema, etc.): Spouse present during session.    Exercises Total Joint Exercises Ankle Circles/Pumps: AROM, Both, 10 reps, Supine Quad Sets: AROM, Both, 5 reps, Supine   Assessment/Plan    PT Assessment Patient needs continued PT services  PT Problem List Decreased range of motion;Decreased mobility;Pain;Impaired sensation;Decreased knowledge of use of  DME;Decreased skin integrity;Decreased knowledge of precautions       PT Treatment Interventions Therapeutic activities;Gait training;Therapeutic exercise;Patient/family education;Balance training;Stair training;Functional mobility training    PT Goals (Current goals can be found in the Care Plan section)  Acute Rehab PT Goals Patient Stated Goal: to get back to playing with grandchildren and gardening PT Goal Formulation: With patient Time For Goal Achievement: 10/21/22 Potential to Achieve Goals: Good    Frequency 7X/week     Co-evaluation               AM-PAC PT "6 Clicks" Mobility  Outcome Measure Help needed turning from your back to your side while in a flat bed without using bedrails?: None Help needed moving from lying on your back to sitting on the side of a flat bed without using bedrails?: None Help needed moving to and from a bed to a chair (including a wheelchair)?: A Little Help needed standing up from a chair using your arms (e.g., wheelchair or bedside chair)?: A Little Help needed to walk in hospital room?: A Little Help needed climbing 3-5 steps with a railing? : A Little 6 Click Score: 20    End of Session Equipment Utilized During Treatment: Gait belt Activity Tolerance: Patient tolerated treatment well;Patient limited by pain Patient left: in bed;with call bell/phone within reach;with family/visitor present Nurse Communication: Mobility status PT Visit Diagnosis: Pain Pain - Right/Left: Left Pain - part of body: Knee    Time: 3267-1245 PT Time Calculation (  min) (ACUTE ONLY): 39 min   Charges:   PT Evaluation $PT Eval Low Complexity: 1 Low PT Treatments $Gait Training: 8-22 mins $Therapeutic Activity: 8-22 mins        Vale HavenShauna H, PT, DPT Acute Rehabilitation Services Secure chat preferred Office [phone]     Blake DivineShauna A Lanier EnsignHartshorne 10/07/2022, 3:42 PM

## 2022-10-07 NOTE — Anesthesia Postprocedure Evaluation (Signed)
Anesthesia Post Note  Patient: Stephanie Noble  Procedure(s) Performed: LEFT TOTAL KNEE ARTHROPLASTY (Left: Knee)     Patient location during evaluation: PACU Anesthesia Type: Regional and General Level of consciousness: awake and alert Pain management: pain level controlled Vital Signs Assessment: post-procedure vital signs reviewed and stable Respiratory status: spontaneous breathing, nonlabored ventilation, respiratory function stable and patient connected to nasal cannula oxygen Cardiovascular status: blood pressure returned to baseline and stable Postop Assessment: no apparent nausea or vomiting Anesthetic complications: no  No notable events documented.  Last Vitals:  Vitals:   10/07/22 1145 10/07/22 1208  BP: 106/67 97/68  Pulse: 76 67  Resp: 18 17  Temp: 36.9 C (!) 36.3 C  SpO2: 96% 98%    Last Pain:  Vitals:   10/07/22 1208  TempSrc: Oral  PainSc:                  Angelice Piech L Kameka Whan

## 2022-10-07 NOTE — Progress Notes (Signed)
Orthopedic Tech Progress Note Patient Details:  Stephanie Noble 1955/08/25 [id number]  CPM Left Knee CPM Left Knee: On Left Knee Flexion (Degrees): 10 Left Knee Extension (Degrees): 40  Post Interventions Patient Tolerated: Well Instructions Provided: Care of device Ortho Devices Type of Ortho Device: Bone foam zero knee Ortho Device/Splint Interventions: Ordered   Post Interventions Patient Tolerated: Well Instructions Provided: Care of device  [name] 10/07/2022, 12:06 PM

## 2022-10-08 ENCOUNTER — Other Ambulatory Visit: Payer: Self-pay | Admitting: Internal Medicine

## 2022-10-08 DIAGNOSIS — M1712 Unilateral primary osteoarthritis, left knee: Secondary | ICD-10-CM | POA: Diagnosis not present

## 2022-10-08 DIAGNOSIS — F5101 Primary insomnia: Secondary | ICD-10-CM

## 2022-10-08 MED ORDER — ASPIRIN 81 MG PO CHEW: 81.0000 mg | CHEWABLE_TABLET | Freq: Two times a day (BID) | ORAL | 0 refills | Status: DC

## 2022-10-08 MED ORDER — OXYCODONE HCL 5 MG PO TABS: 5.0000 mg | ORAL_TABLET | ORAL | 0 refills | Status: DC | PRN

## 2022-10-08 MED ORDER — DOCUSATE SODIUM 100 MG PO CAPS: 100.0000 mg | ORAL_CAPSULE | Freq: Two times a day (BID) | ORAL | 0 refills | Status: DC

## 2022-10-08 NOTE — TOC Transition Note (Signed)
Transition of Care [name]) - CM/SW Discharge Note   Patient Details  Name: Stephanie Noble MRN: [id number] Date of Birth: 23-Jan-1956  Transition of Care [facility] CM/SW Contact:  Epifanio Lesches, RN Phone Number: 10/08/2022, 11:29 AM   Clinical Narrative:    Patient will DC to: home  Anticipated DC date: 10/08/2022 Family notified: yes Transport by: car   Per MD patient ready for DC today. RN, patient, patient's husband , and  [facility] notified of D/C. Husband to assist with care once d/c. Pt already with DME, CPM, RW, and 3in1/BSC @ home.Post hospital f/u  noted on AVS. Pt without RX med concerns. Husband to provide transportation to home     RNCM will sign off for now as intervention is no longer needed. Please consult [place] again if new needs arise.   Final next level of care: Home w Home Health Services Barriers to Discharge: No Barriers Identified   Patient Goals and CMS Choice      Discharge Placement                         Discharge Plan and Services Additional resources added to the After Visit Summary for                            [facility] Arranged: PT HH Agency: [facility] Date [facility] Agency Contacted: 10/08/22 Time HH Agency Contacted: [phone] Representative spoke with at [facility] Agency: Tresa Endo  Social Determinants of Health (SDOH) Interventions SDOH Screenings   Food Insecurity: No Food Insecurity (10/07/2022)  Housing: Low Risk  (10/07/2022)  Transportation Needs: No Transportation Needs (10/07/2022)  Utilities: Not At Risk (10/07/2022)  Depression (PHQ2-9): Low Risk  (09/04/2022)  Tobacco Use: Low Risk  (10/07/2022)     Readmission Risk Interventions     No data to display

## 2022-10-08 NOTE — Progress Notes (Signed)
Physical Therapy Treatment Patient Details Name: Stephanie Noble MRN: [id number] DOB: [birth year] Today's Date: 10/08/2022   History of Present Illness Patient is a 67 y/o female who presents 4/9 for left TKA. PMH includes asthma, depression and multiple surgeries re: hip, neck, back.    PT Comments    Excellent progression with acute rehab. Safely ambulating with light support from rolling walker at a supervision level. Safely completed stair training and feels confident with task. No buckling noted with full weight bearing. Provided and reviewed precaution and exercise handout. All questions answered. Adequate for d/c from PT standpoint when medically ready. Will follow until d/c.    Recommendations for follow up therapy are one component of a multi-disciplinary discharge planning process, led by the attending physician.  Recommendations may be updated based on patient status, additional functional criteria and insurance authorization.     Assistance Recommended at Discharge PRN  Patient can return home with the following Help with stairs or ramp for entrance;Assistance with cooking/housework;Assist for transportation;A little help with walking and/or transfers;A little help with bathing/dressing/bathroom   Equipment Recommendations  None recommended by PT       Precautions / Restrictions Precautions Precautions: Knee Precaution Booklet Issued: Yes (comment) Precaution Comments: Reviewed precautions Required Braces or Orthoses: Knee Immobilizer - Left Restrictions Weight Bearing Restrictions: Yes LLE Weight Bearing: Weight bearing as tolerated     Mobility  Bed Mobility               General bed mobility comments: In recliner    Transfers Overall transfer level: Modified independent Equipment used: Rolling walker (2 wheels)               General transfer comment: Stands from various surfaces without assistance. Using RW lightly for support upon rising     Ambulation/Gait Ambulation/Gait assistance: Supervision Gait Distance (Feet): 275 Feet Assistive device: Rolling walker (2 wheels) Gait Pattern/deviations: Step-through pattern, Antalgic Gait velocity: decreased Gait velocity interpretation: 1.31 - 2.62 ft/sec, indicative of limited community ambulator   General Gait Details: Reviewed AD use with rolling walker supervision for safety. Initially with KI, then removed, showing good control of knee without evidence of buckling.   Stairs Stairs: Yes Stairs assistance: Supervision Stair Management: One rail Right Number of Stairs: 5 General stair comments: Stair navigation training with single rail similar to home set-up. Educated on sequencing and had good recall. Performs safely without evidence of instability. Pt feels confident with task.   Wheelchair Mobility    Modified Rankin (Stroke Patients Only)       Balance Overall balance assessment: Mild deficits observed, not formally tested                                          Cognition Arousal/Alertness: Awake/alert Behavior During Therapy: WFL for tasks assessed/performed Overall Cognitive Status: Within Functional Limits for tasks assessed                                          Exercises Total Joint Exercises Ankle Circles/Pumps: AROM, Both, 10 reps, Supine Quad Sets: Both, 5 reps, Supine, Strengthening Short Arc Quad: Strengthening, Left, 5 reps, Seated Heel Slides: AROM, Left, 5 reps, Seated Hip ABduction/ADduction: Strengthening, Both, 5 reps, Seated Straight Leg Raises: Strengthening, Left, 5 reps, Seated Long  Arc Quad: Strengthening, Left, 5 reps, Seated Knee Flexion: AROM, Left, 5 reps, Seated Goniometric ROM: 2-91 degrees left knee flexion    General Comments        Pertinent Vitals/Pain Pain Assessment Pain Assessment: Faces Faces Pain Scale: Hurts little more Pain Location: left knee Pain Descriptors /  Indicators: Sore, Operative site guarding, Discomfort Pain Intervention(s): Monitored during session, Premedicated before session, Repositioned, Ice applied    Home Living                          Prior Function            PT Goals (current goals can now be found in the care plan section) Acute Rehab PT Goals Patient Stated Goal: to get back to playing with grandchildren and gardening PT Goal Formulation: With patient Time For Goal Achievement: 10/21/22 Potential to Achieve Goals: Good Progress towards PT goals: Progressing toward goals    Frequency    7X/week      PT Plan Current plan remains appropriate    Co-evaluation              AM-PAC PT "6 Clicks" Mobility   Outcome Measure  Help needed turning from your back to your side while in a flat bed without using bedrails?: None Help needed moving from lying on your back to sitting on the side of a flat bed without using bedrails?: None Help needed moving to and from a bed to a chair (including a wheelchair)?: None Help needed standing up from a chair using your arms (e.g., wheelchair or bedside chair)?: None Help needed to walk in hospital room?: A Little Help needed climbing 3-5 steps with a railing? : A Little 6 Click Score: 22    End of Session Equipment Utilized During Treatment: Gait belt Activity Tolerance: Patient tolerated treatment well Patient left: with call bell/phone within reach;in chair Nurse Communication: Mobility status PT Visit Diagnosis: Pain Pain - Right/Left: Left Pain - part of body: Knee     Time: [id number] PT Time Calculation (min) (ACUTE ONLY): 28 min  Charges:  $Gait Training: 8-22 mins $Therapeutic Exercise: 8-22 mins                     Kathlyn Sacramento, PT, DPT Physical Therapist Acute Rehabilitation Services [facility]    Berton Mount 10/08/2022, 9:27 AM

## 2022-10-08 NOTE — Plan of Care (Signed)
?  Problem: Activity: ?Goal: Ability to avoid complications of mobility impairment will improve ?Outcome: Progressing ?Goal: Range of joint motion will improve ?Outcome: Progressing ?  ?Problem: Pain Management: ?Goal: Pain level will decrease with appropriate interventions ?Outcome: Progressing ?  ?Problem: Skin Integrity: ?Goal: Will show signs of wound healing ?Outcome: Progressing ?  ?

## 2022-10-09 ENCOUNTER — Telehealth: Payer: Self-pay | Admitting: *Deleted

## 2022-10-09 ENCOUNTER — Encounter (HOSPITAL_COMMUNITY): Payer: Self-pay | Admitting: Orthopedic Surgery

## 2022-10-09 NOTE — Telephone Encounter (Signed)
Ortho bundle call completed. 

## 2022-10-12 NOTE — Discharge Summary (Signed)
Physician Discharge Summary      Patient ID: Stephanie Noble MRN: [id number] DOB/AGE: 03-20-1956 67 y.o.  Admit date: 10/07/2022 Discharge date:/10/24  Admission Diagnoses:  Principal Problem:   S/P total knee replacement, left   Discharge Diagnoses:  Same  Surgeries: Procedure(s): LEFT TOTAL KNEE ARTHROPLASTY on 10/07/2022   Consultants:   Discharged Condition: Stable  Hospital Course: Stephanie Noble is an 67 y.o. female who was admitted 10/07/2022 with a chief complaint of left knee pain, and found to have a diagnosis of left knee osteoarthritis.  They were brought to the operating room on 10/07/2022 and underwent the above named procedures.  Pt awoke from anesthesia without complication and was transferred to the floor. On POD1, patient's pain was overall controlled.  Patient was able to mobilize exceptionally well with physical therapy.  No red flag symptoms throughout her stay.  She was discharged home on POD 1..  Pt will f/u with Dr. August Saucer in clinic in ~2 weeks.   Antibiotics given:  Anti-infectives (From admission, onward)    Start     Dose/Rate Route Frequency Ordered Stop   10/07/22 1400  ceFAZolin (ANCEF) IVPB 1 g/50 mL premix        1 g 100 mL/hr over 30 Minutes Intravenous Every 8 hours 10/07/22 1234 10/07/22 2228   10/07/22 0840  vancomycin (VANCOCIN) powder  Status:  Discontinued          As needed 10/07/22 0840 10/07/22 1035   10/07/22 0615  ceFAZolin (ANCEF) IVPB 2g/100 mL premix        2 g 200 mL/hr over 30 Minutes Intravenous On call to O.R. 10/07/22 5784 10/07/22 0753     .  Recent vital signs:  Vitals:   10/07/22 2017 10/08/22 0933  BP: 102/68 (!) 92/58  Pulse: 61 (!) 53  Resp: 19 18  Temp: 97.9 F (36.6 C) 97.6 F (36.4 C)  SpO2: 100% 100%    Recent laboratory studies:  Results for orders placed or performed during the hospital encounter of 09/25/22  Surgical pcr screen   Specimen: Nasal Mucosa; Nasal Swab  Result Value Ref Range   MRSA,  PCR NEGATIVE NEGATIVE   Staphylococcus aureus NEGATIVE NEGATIVE  Urine Culture (for pregnant, neutropenic or urologic patients or patients with an indwelling urinary catheter)   Specimen: Urine, Clean Catch  Result Value Ref Range   Specimen Description URINE, CLEAN CATCH    Special Requests NONE    Culture      NO GROWTH Performed at [facility], [address] [place], [place] [address]    Report Status 09/26/2022 FINAL   CBC  Result Value Ref Range   WBC 4.0 4.0 - 10.5 K/uL   RBC 4.61 3.87 - 5.11 MIL/uL   Hemoglobin 14.1 12.0 - 15.0 g/dL   HCT 52.8 41.3 - 24.4 %   MCV 92.2 80.0 - 100.0 fL   MCH 30.6 26.0 - 34.0 pg   MCHC 33.2 30.0 - 36.0 g/dL   RDW 01.0 27.2 - 53.6 %   Platelets 221 150 - 400 K/uL   nRBC 0.0 0.0 - 0.2 %  Basic metabolic panel  Result Value Ref Range   Sodium 139 135 - 145 mmol/L   Potassium 4.3 3.5 - 5.1 mmol/L   Chloride 100 98 - 111 mmol/L   CO2 29 22 - 32 mmol/L   Glucose, Bld 82 70 - 99 mg/dL   BUN 13 8 - 23 mg/dL   Creatinine, Ser  0.83 0.44 - 1.00 mg/dL   Calcium 9.6 8.9 - 78.2 mg/dL   GFR, Estimated >95 >62 mL/min   Anion gap 10 5 - 15  Urinalysis, Complete w Microscopic -Urine, Clean Catch  Result Value Ref Range   Color, Urine YELLOW YELLOW   APPearance CLEAR CLEAR   Specific Gravity, Urine 1.008 1.005 - 1.030   pH 6.0 5.0 - 8.0   Glucose, UA NEGATIVE NEGATIVE mg/dL   Hgb urine dipstick SMALL (A) NEGATIVE   Bilirubin Urine NEGATIVE NEGATIVE   Ketones, ur NEGATIVE NEGATIVE mg/dL   Protein, ur NEGATIVE NEGATIVE mg/dL   Nitrite NEGATIVE NEGATIVE   Leukocytes,Ua NEGATIVE NEGATIVE   RBC / HPF 0-5 0 - 5 RBC/hpf   WBC, UA 0-5 0 - 5 WBC/hpf   Bacteria, UA NONE SEEN NONE SEEN   Squamous Epithelial / HPF 0-5 0 - 5 /HPF    Discharge Medications:   Allergies as of 10/08/2022       Reactions   Vistaril [hydroxyzine Hcl] Other (See Comments)   Pt went into cardiac arrest    Hydromorphone Hcl Hives   Per report from PACU RN, pt had  itching on arrival to pacu then developed hives after dilaudid given. Discussed with Dr. Jake Samples and dilaudid allergy being added to record (09/06/21)   Dilaudid [hydromorphone] Rash        Medication List     TAKE these medications    albuterol (2.5 MG/3ML) 0.083% nebulizer solution Commonly known as: PROVENTIL Take 3 mLs (2.5 mg total) by nebulization every 4 (four) hours as needed for wheezing or shortness of breath. What changed: Another medication with the same name was changed. Make sure you understand how and when to take each.   albuterol 108 (90 Base) MCG/ACT inhaler Commonly known as: VENTOLIN HFA Inhale 2 sprays QID prn wheezing What changed:  how much to take how to take this when to take this reasons to take this additional instructions   aspirin 81 MG chewable tablet Chew 1 tablet (81 mg total) by mouth 2 (two) times daily.   docusate sodium 100 MG capsule Commonly known as: COLACE Take 1 capsule (100 mg total) by mouth 2 (two) times daily.   DULoxetine 20 MG capsule Commonly known as: CYMBALTA Take 1 capsule (20 mg total) by mouth daily.   Fish Oil 1200 MG Caps Take 1,200 mg by mouth 2 (two) times a week.   folic acid 1 MG tablet Commonly known as: FOLVITE Take 1 tablet (1 mg total) by mouth daily.   gabapentin 300 MG capsule Commonly known as: NEURONTIN TAKE 2 CAPSULE IN THE MORNING , 2 AT LUNCH AND 3 AT BEDTIME.   meloxicam 7.5 MG tablet Commonly known as: MOBIC TAKE 1 TABLET BY MOUTH EVERY DAY   methocarbamol 750 MG tablet Commonly known as: ROBAXIN TAKE 1 TABLET BY MOUTH THREE TIMES A DAY AS NEEDED   mupirocin ointment 2 % Commonly known as: BACTROBAN Apply 1 Application topically 2 (two) times daily as needed (wound care).   ofloxacin 0.3 % OTIC solution Commonly known as: FLOXIN Place 5 drops into the left ear daily.   oxyCODONE 5 MG immediate release tablet Commonly known as: Oxy IR/ROXICODONE Take 1 tablet (5 mg total) by mouth  every 4 (four) hours as needed for moderate pain or severe pain. What changed:  how much to take when to take this reasons to take this additional instructions   Systane Balance 0.6 % Soln Generic drug: Propylene Glycol  Place 1 drop into both eyes 2 (two) times daily as needed (dry eyes).        Diagnostic Studies: No results found.  Disposition: Discharge disposition: 01-Home or Self Care       Discharge Instructions     Call MD / Call 911   Complete by: As directed    If you experience chest pain or shortness of breath, CALL 911 and be transported to the hospital emergency room.  If you develope a fever above 101 F, pus (white drainage) or increased drainage or redness at the wound, or calf pain, call your surgeon's office.   Constipation Prevention   Complete by: As directed    Drink plenty of fluids.  Prune juice may be helpful.  You may use a stool softener, such as Colace (over the counter) 100 mg twice a day.  Use MiraLax (over the counter) for constipation as needed.   Diet - low sodium heart healthy   Complete by: As directed    Discharge instructions   Complete by: As directed    You may shower, dressing is waterproof.  Do not remove the dressing, we will remove it at your first post-op appointment.  Do not take a bath or soak the knee in a tub or pool.  You may weightbear as you can tolerate on the operative leg with a walker.  Continue using the CPM machine 3 times per day for one hour each time, increasing the degrees of range of motion daily.  Use the blue cradle boot under your heel to work on getting your leg straight.  Do NOT put a pillow under your knee.  You will follow-up with Dr. August Saucer in the clinic in 2 weeks at your given appointment date.    INSTRUCTIONS AFTER JOINT REPLACEMENT   Remove items at home which could result in a fall. This includes throw rugs or furniture in walking pathways ICE to the affected joint every three hours while awake for 30  minutes at a time, for at least the first 3-5 days, and then as needed for pain and swelling.  Continue to use ice for pain and swelling. You may notice swelling that will progress down to the foot and ankle.  This is normal after surgery.  Elevate your leg when you are not up walking on it.   Continue to use the breathing machine you got in the hospital (incentive spirometer) which will help keep your temperature down.  It is common for your temperature to cycle up and down following surgery, especially at night when you are not up moving around and exerting yourself.  The breathing machine keeps your lungs expanded and your temperature down.   DIET:  As you were doing prior to hospitalization, we recommend a well-balanced diet.  DRESSING / WOUND CARE / SHOWERING  Keep the surgical dressing until follow up.  The dressing is water proof, so you can shower without any extra covering.  IF THE DRESSING FALLS OFF or the wound gets wet inside, change the dressing with sterile gauze.  Please use good hand washing techniques before changing the dressing.  Do not use any lotions or creams on the incision until instructed by your surgeon.    ACTIVITY  Increase activity slowly as tolerated, but follow the weight bearing instructions below.   No driving for 6 weeks or until further direction given by your physician.  You cannot drive while taking narcotics.  No lifting or carrying greater than 10  lbs. until further directed by your surgeon. Avoid periods of inactivity such as sitting longer than an hour when not asleep. This helps prevent blood clots.  You may return to work once you are authorized by your doctor.     WEIGHT BEARING   Weight bearing as tolerated with assist device (walker, cane, etc) as directed, use it as long as suggested by your surgeon or therapist, typically at least 4-6 weeks.   EXERCISES  Results after joint replacement surgery are often greatly improved when you follow the  exercise, range of motion and muscle strengthening exercises prescribed by your doctor. Safety measures are also important to protect the joint from further injury. Any time any of these exercises cause you to have increased pain or swelling, decrease what you are doing until you are comfortable again and then slowly increase them. If you have problems or questions, call your caregiver or physical therapist for advice.   Rehabilitation is important following a joint replacement. After just a few days of immobilization, the muscles of the leg can become weakened and shrink (atrophy).  These exercises are designed to build up the tone and strength of the thigh and leg muscles and to improve motion. Often times heat used for twenty to thirty minutes before working out will loosen up your tissues and help with improving the range of motion but do not use heat for the first two weeks following surgery (sometimes heat can increase post-operative swelling).   These exercises can be done on a training (exercise) mat, on the floor, on a table or on a bed. Use whatever works the best and is most comfortable for you.    Use music or television while you are exercising so that the exercises are a pleasant break in your day. This will make your life better with the exercises acting as a break in your routine that you can look forward to.   Perform all exercises about fifteen times, three times per day or as directed.  You should exercise both the operative leg and the other leg as well.  Exercises include:   Quad Sets - Tighten up the muscle on the front of the thigh (Quad) and hold for 5-10 seconds.   Straight Leg Raises - With your knee straight (if you were given a brace, keep it on), lift the leg to 60 degrees, hold for 3 seconds, and slowly lower the leg.  Perform this exercise against resistance later as your leg gets stronger.  Leg Slides: Lying on your back, slowly slide your foot toward your buttocks, bending  your knee up off the floor (only go as far as is comfortable). Then slowly slide your foot back down until your leg is flat on the floor again.  Angel Wings: Lying on your back spread your legs to the side as far apart as you can without causing discomfort.  Hamstring Strength:  Lying on your back, push your heel against the floor with your leg straight by tightening up the muscles of your buttocks.  Repeat, but this time bend your knee to a comfortable angle, and push your heel against the floor.  You may put a pillow under the heel to make it more comfortable if necessary.   A rehabilitation program following joint replacement surgery can speed recovery and prevent re-injury in the future due to weakened muscles. Contact your doctor or a physical therapist for more information on knee rehabilitation.    CONSTIPATION  Constipation is defined medically  as fewer than three stools per week and severe constipation as less than one stool per week.  Even if you have a regular bowel pattern at home, your normal regimen is likely to be disrupted due to multiple reasons following surgery.  Combination of anesthesia, postoperative narcotics, change in appetite and fluid intake all can affect your bowels.   YOU MUST use at least one of the following options; they are listed in order of increasing strength to get the job done.  They are all available over the counter, and you may need to use some, POSSIBLY even all of these options:    Drink plenty of fluids (prune juice may be helpful) and high fiber foods Colace 100 mg by mouth twice a day  Senokot for constipation as directed and as needed Dulcolax (bisacodyl), take with full glass of water  Miralax (polyethylene glycol) once or twice a day as needed.  If you have tried all these things and are unable to have a bowel movement in the first 3-4 days after surgery call either your surgeon or your primary doctor.    If you experience loose stools or  diarrhea, hold the medications until you stool forms back up.  If your symptoms do not get better within 1 week or if they get worse, check with your doctor.  If you experience "the worst abdominal pain ever" or develop nausea or vomiting, please contact the office immediately for further recommendations for treatment.   ITCHING:  If you experience itching with your medications, try taking only a single pain pill, or even half a pain pill at a time.  You can also use Benadryl over the counter for itching or also to help with sleep.   TED HOSE STOCKINGS:  Use stockings on both legs until for at least 2 weeks or as directed by physician office. They may be removed at night for sleeping.  MEDICATIONS:  See your medication summary on the "After Visit Summary" that nursing will review with you.  You may have some home medications which will be placed on hold until you complete the course of blood thinner medication.  It is important for you to complete the blood thinner medication as prescribed.  PRECAUTIONS:  If you experience chest pain or shortness of breath - call 911 immediately for transfer to the hospital emergency department.   If you develop a fever greater that 101 F, purulent drainage from wound, increased redness or drainage from wound, foul odor from the wound/dressing, or calf pain - CONTACT YOUR SURGEON.                                                   FOLLOW-UP APPOINTMENTS:  If you do not already have a post-op appointment, please call the office for an appointment to be seen by your surgeon.  Guidelines for how soon to be seen are listed in your "After Visit Summary", but are typically between 1-4 weeks after surgery.  OTHER INSTRUCTIONS:   Knee Replacement:  Do not place pillow under knee, focus on keeping the knee straight while resting. CPM instructions: 0-90 degrees, 2 hours in the morning, 2 hours in the afternoon, and 2 hours in the evening. Place foam block, curve side up under  heel at all times except when in CPM or when walking.  DO NOT modify,  tear, cut, or change the foam block in any way.  POST-OPERATIVE OPIOID TAPER INSTRUCTIONS: It is important to wean off of your opioid medication as soon as possible. If you do not need pain medication after your surgery it is ok to stop day one. Opioids include: Codeine, Hydrocodone(Norco, Vicodin), Oxycodone(Percocet, oxycontin) and hydromorphone amongst others.  Long term and even short term use of opiods can cause: Increased pain response Dependence Constipation Depression Respiratory depression And more.  Withdrawal symptoms can include Flu like symptoms Nausea, vomiting And more Techniques to manage these symptoms Hydrate well Eat regular healthy meals Stay active Use relaxation techniques(deep breathing, meditating, yoga) Do Not substitute Alcohol to help with tapering If you have been on opioids for less than two weeks and do not have pain than it is ok to stop all together.  Plan to wean off of opioids This plan should start within one week post op of your joint replacement. Maintain the same interval or time between taking each dose and first decrease the dose.  Cut the total daily intake of opioids by one tablet each day Next start to increase the time between doses. The last dose that should be eliminated is the evening dose.   MAKE SURE YOU:  Understand these instructions.  Get help right away if you are not doing well or get worse.    Thank you for letting us be a part of your medical care team.  It is a privilege we respect greatly.  We hope these instructions will help you stay on track for a fast and full recovery!    Dental Antibiotics:  In most cases prophylactic antibiotics for Dental procdeures after total joint surgery are not necessary.  Exceptions are as follows:  1. History of prior total joint infection  2. Severely immunocompromised (Organ Transplant, cancer chemotherapy,  Rheumatoid biologic meds such as Humera)  3. Poorly controlled diabetes (A1C &gt; 8.0, blood glucose over 200)  If you have one of these conditions, contact your surgeon for an antibiotic prescription, prior to your dental procedure.   Increase activity slowly as tolerated   Complete by: As directed    Post-operative opioid taper instructions:   Complete by: As directed    POST-OPERATIVE OPIOID TAPER INSTRUCTIONS: It is important to wean off of your opioid medication as soon as possible. If you do not need pain medication after your surgery it is ok to stop day one. Opioids include: Codeine, Hydrocodone(Norco, Vicodin), Oxycodone(Percocet, oxycontin) and hydromorphone amongst others.  Long term and even short term use of opiods can cause: Increased pain response Dependence Constipation Depression Respiratory depression And more.  Withdrawal symptoms can include Flu like symptoms Nausea, vomiting And more Techniques to manage these symptoms Hydrate well Eat regular healthy meals Stay active Use relaxation techniques(deep breathing, meditating, yoga) Do Not substitute Alcohol to help with tapering If you have been on opioids for less than two weeks and do not have pain than it is ok to stop all together.  Plan to wean off of opioids This plan should start within one week post op of your joint replacement. Maintain the same interval or time between taking each dose and first decrease the dose.  Cut the total daily intake of opioids by one tablet each day Next start to increase the time between doses. The last dose that should be eliminated is the evening dose.           Follow-up Information     Cammy Copa,  MD. Call.   Specialty: Orthopedic Surgery Contact information: [address] [place] [address] [phone]         [facility] Follow up.   Specialty: Home Health Services Why: home health services will be provided by [facility], start of care  within 48 hours post discharge Contact information: [address] [place] [address] [phone]         Anabel Halon, MD Follow up.   Specialty: Internal Medicine Contact information: [address] [address] [place] [address] [phone]                  Signed: Karenann Cai 10/12/2022, 4:22 PM

## 2022-10-13 ENCOUNTER — Ambulatory Visit: Payer: Medicare Other | Admitting: Internal Medicine

## 2022-10-20 ENCOUNTER — Telehealth: Payer: Self-pay | Admitting: Orthopedic Surgery

## 2022-10-20 NOTE — Telephone Encounter (Signed)
Please advise. Patient is status post Left TKA on 10/07/2022.

## 2022-10-20 NOTE — Telephone Encounter (Signed)
Patient requested Oxycodone refill sent to [facility] in [place] please advise

## 2022-10-21 ENCOUNTER — Other Ambulatory Visit: Payer: Self-pay | Admitting: Surgical

## 2022-10-21 MED ORDER — OXYCODONE HCL 5 MG PO TABS: 5.0000 mg | ORAL_TABLET | Freq: Four times a day (QID) | ORAL | 0 refills | Status: DC | PRN

## 2022-10-21 NOTE — Telephone Encounter (Signed)
I called to advise, line is busy.

## 2022-10-21 NOTE — Telephone Encounter (Signed)
I called patient and advised. 

## 2022-10-21 NOTE — Telephone Encounter (Signed)
Sent in

## 2022-10-22 ENCOUNTER — Other Ambulatory Visit (INDEPENDENT_AMBULATORY_CARE_PROVIDER_SITE_OTHER): Payer: Medicare Other

## 2022-10-22 ENCOUNTER — Ambulatory Visit (INDEPENDENT_AMBULATORY_CARE_PROVIDER_SITE_OTHER): Payer: Medicare Other | Admitting: Orthopedic Surgery

## 2022-10-22 DIAGNOSIS — Z96652 Presence of left artificial knee joint: Secondary | ICD-10-CM

## 2022-10-24 ENCOUNTER — Encounter: Payer: Self-pay | Admitting: Orthopedic Surgery

## 2022-10-24 NOTE — Progress Notes (Signed)
Post-Op Visit Note   Patient: Stephanie Noble           Date of Birth: Nov 26, 1955           MRN: [id number] Visit Date: 10/22/2022 PCP: Anabel Halon, MD   Assessment & Plan:  Chief Complaint:  Chief Complaint  Patient presents with   Left Knee - Routine Post Op    Left TKA on 10/07/22   Visit Diagnoses:  1. Status post total left knee replacement     Plan: [name] is a patient is postop left total knee replacement done 10/07/2022.  Doing well.  CPM at 105.  Physical therapy note is reviewed.  She is doing well with strengthening and bends to about 95 on their reports.  On exam the incision looks good.  Has good patella mobility.  Bends easily to 90 to 95 degrees.  Pain medicines refilled yesterday.  She is doing home exercises as well.  Radiographs look good.  Would like for her to start physical therapy just about 1 time a week for 4 weeks and we will see her back in 6 weeks for clinical recheck. No calf tenderness negative Homans on exam Follow-Up Instructions: No follow-ups on file.   Orders:  Orders Placed This Encounter  Procedures   XR Knee 1-2 Views Left   Ambulatory referral to Physical Therapy   No orders of the defined types were placed in this encounter.   Imaging: No results found.  PMFS History: Patient Active Problem List   Diagnosis Date Noted   S/P total knee replacement, left 10/07/2022   Rosacea 09/04/2022   Acute mucoid otitis media of left ear 09/04/2022   Pseudarthrosis after fusion or arthrodesis 06/17/2022   Acute non-recurrent frontal sinusitis 04/08/2022   Oral aphthous ulcer 02/12/2022   Primary osteoarthritis of both knees 02/12/2022   Cervical spondylosis 09/05/2021   Tenosynovitis, de Quervain 08/01/2020   Right hand pain 08/01/2020   Central spinal stenosis 05/01/2020   H/O cardiac arrest 05/01/2020   Asthma 05/01/2020   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   Hip arthritis 11/09/2018   Lumbar post-laminectomy syndrome 08/12/2017    Pain in thoracic spine 05/13/2017   PTSD (post-traumatic stress disorder) 04/13/2017   Hallux rigidus, left foot 09/15/2016   Hallux rigidus, right foot 09/15/2016   HNP (herniated nucleus pulposus), lumbar 04/16/2016   Acquired scoliosis 09/07/2015   Cervical spondylosis with radiculopathy 06/22/2015   Dysphagia 04/26/2014   Insomnia 01/04/2013   Past Medical History:  Diagnosis Date   Anxiety    Asthma    uses albuterol as needed "mostly weather induced".ALbuterol and Ventolin inhaler as needed   Chronic back pain    Chronic neck pain    Complication of anesthesia    post-op PEA arrest 04/16/16, thought due to respriatory arrest from sedation, narcotic, hypovolemia   Degenerative disc disease    cervical and lumbar area   Depression    History of bronchitis 2016   History of cervical spinal surgery 12/30/2019   History of lumbar surgery 12/30/2019   Insomnia    takes Ambien nightly   Muscle spasm    takes Robaxin daily    Pneumonia    hx of-2016   PONV (postoperative nausea and vomiting)    Weakness    numbness and tingling in legs    Family History  Problem Relation Age of Onset   Dementia Mother    Other Sister    Breast cancer Sister  Alcohol abuse Other    Breast cancer Paternal Aunt     Past Surgical History:  Procedure Laterality Date   ANTERIOR CERVICAL DECOMP/DISCECTOMY FUSION N/A 09/05/2021   Procedure: CERVICAL THREE-FOUR ANTERIOR CERVICAL DECOMPRESSION/DISCECTOMY FUSION WITH PARTIAL PLATE REMOVAL AT CERVICAL FOUR-FIVE;  Surgeon: Bethann Goo, DO;  Location: [facility] OR;  Service: Neurosurgery;  Laterality: N/A;  3C   BACK SURGERY     x 3 lumbar   CHOLECYSTECTOMY     DILATION AND CURETTAGE OF UTERUS     x 2   JOINT REPLACEMENT     KNEE SURGERY Left    x 2   left knee arthroscopy      x 2   POSTERIOR CERVICAL FUSION/FORAMINOTOMY  05/17/2012   Procedure: POSTERIOR CERVICAL FUSION/FORAMINOTOMY LEVEL 2;  Surgeon: Hewitt Shorts, MD;  Location: [facility];  Service: Neurosurgery;  Laterality: N/A;  cervical six to Thoracic One posterior cervical arthrodesis with instrumentation   POSTERIOR CERVICAL FUSION/FORAMINOTOMY N/A 06/17/2022   Procedure: CERVICAL TWO-THREE, CERVICAL THREE-FOUR, CERVICAL FOUR-FIVE POSTERIOR CERVICAL INSTRUMENTATION AND FUSION WITH EXTENSION TO CERVICAL SIX-THORACIC ONE FUSION;  Surgeon: Dawley, Alan Mulder, DO;  Location: [facility] OR;  Service: Neurosurgery;  Laterality: N/A;   SHOULDER ARTHROSCOPY Left 10/27/2019   Procedure: left shoulder arthroscopy with arthroscopic vs open distal clavical excision;  Surgeon: Cammy Copa, MD;  Location: [facility] OR;  Service: Orthopedics;  Laterality: Left;   SHOULDER SURGERY     right   SPINE SURGERY     x 5   TOE FUSION Left 09/2016   TOTAL HIP ARTHROPLASTY Left 11/09/2018   Procedure: LEFT TOTAL HIP ARTHROPLASTY DIRECT ANTERIOR APPROACH;  Surgeon: Cammy Copa, MD;  Location: [facility] ORS;  Service: Orthopedics;  Laterality: Left;   TOTAL KNEE ARTHROPLASTY Left 10/07/2022   Procedure: LEFT TOTAL KNEE ARTHROPLASTY;  Surgeon: Cammy Copa, MD;  Location: [facility] OR;  Service: Orthopedics;  Laterality: Left;   Social History   Occupational History   Not on file  Tobacco Use   Smoking status: Never   Smokeless tobacco: Never  Vaping Use   Vaping Use: Never used  Substance and Sexual Activity   Alcohol use: No   Drug use: No   Sexual activity: Yes    Birth control/protection: Post-menopausal

## 2022-10-26 ENCOUNTER — Other Ambulatory Visit: Payer: Self-pay | Admitting: Internal Medicine

## 2022-10-26 DIAGNOSIS — M4800 Spinal stenosis, site unspecified: Secondary | ICD-10-CM

## 2022-10-26 DIAGNOSIS — G8929 Other chronic pain: Secondary | ICD-10-CM

## 2022-10-26 DIAGNOSIS — M1712 Unilateral primary osteoarthritis, left knee: Secondary | ICD-10-CM

## 2022-10-28 ENCOUNTER — Ambulatory Visit (HOSPITAL_COMMUNITY): Payer: Medicare Other | Attending: Orthopedic Surgery | Admitting: Physical Therapy

## 2022-10-28 ENCOUNTER — Encounter (HOSPITAL_COMMUNITY): Payer: Self-pay | Admitting: Physical Therapy

## 2022-10-28 ENCOUNTER — Other Ambulatory Visit: Payer: Self-pay

## 2022-10-28 DIAGNOSIS — M25562 Pain in left knee: Secondary | ICD-10-CM | POA: Diagnosis present

## 2022-10-28 DIAGNOSIS — Z96652 Presence of left artificial knee joint: Secondary | ICD-10-CM | POA: Insufficient documentation

## 2022-10-28 DIAGNOSIS — M6281 Muscle weakness (generalized): Secondary | ICD-10-CM

## 2022-10-28 DIAGNOSIS — R2689 Other abnormalities of gait and mobility: Secondary | ICD-10-CM | POA: Diagnosis present

## 2022-10-28 DIAGNOSIS — R29898 Other symptoms and signs involving the musculoskeletal system: Secondary | ICD-10-CM

## 2022-10-28 NOTE — Therapy (Signed)
OUTPATIENT PHYSICAL THERAPY LOWER EXTREMITY EVALUATION   Patient Name: Stephanie Noble MRN: [id number] DOB:[birth year], 67 y.o., female Today's Date: 10/28/2022  END OF SESSION:  PT End of Session - 10/28/22 0953     Visit Number 1    Number of Visits 4    Date for PT Re-Evaluation 11/25/22    Authorization Type Medicare Secondary [employer]    PT Start Time [phone]    PT Stop Time 1035    PT Time Calculation (min) 42 min    Activity Tolerance Patient tolerated treatment well    Behavior During Therapy WFL for tasks assessed/performed             Past Medical History:  Diagnosis Date   Anxiety    Asthma    uses albuterol as needed "mostly weather induced".ALbuterol and Ventolin inhaler as needed   Chronic back pain    Chronic neck pain    Complication of anesthesia    post-op PEA arrest 04/16/16, thought due to respriatory arrest from sedation, narcotic, hypovolemia   Degenerative disc disease    cervical and lumbar area   Depression    History of bronchitis 2016   History of cervical spinal surgery 12/30/2019   History of lumbar surgery 12/30/2019   Insomnia    takes Ambien nightly   Muscle spasm    takes Robaxin daily    Pneumonia    hx of-2016   PONV (postoperative nausea and vomiting)    Weakness    numbness and tingling in legs   Past Surgical History:  Procedure Laterality Date   ANTERIOR CERVICAL DECOMP/DISCECTOMY FUSION N/A 09/05/2021   Procedure: CERVICAL THREE-FOUR ANTERIOR CERVICAL DECOMPRESSION/DISCECTOMY FUSION WITH PARTIAL PLATE REMOVAL AT CERVICAL FOUR-FIVE;  Surgeon: Bethann Goo, DO;  Location: [facility];  Service: Neurosurgery;  Laterality: N/A;  3C   BACK SURGERY     x 3 lumbar   CHOLECYSTECTOMY     DILATION AND CURETTAGE OF UTERUS     x 2   JOINT REPLACEMENT     KNEE SURGERY Left    x 2   left knee arthroscopy      x 2   POSTERIOR CERVICAL FUSION/FORAMINOTOMY  05/17/2012   Procedure: POSTERIOR CERVICAL FUSION/FORAMINOTOMY LEVEL 2;  Surgeon:  Hewitt Shorts, MD;  Location: [facility];  Service: Neurosurgery;  Laterality: N/A;  cervical six to Thoracic One posterior cervical arthrodesis with instrumentation   POSTERIOR CERVICAL FUSION/FORAMINOTOMY N/A 06/17/2022   Procedure: CERVICAL TWO-THREE, CERVICAL THREE-FOUR, CERVICAL FOUR-FIVE POSTERIOR CERVICAL INSTRUMENTATION AND FUSION WITH EXTENSION TO CERVICAL SIX-THORACIC ONE FUSION;  Surgeon: Dawley, Alan Mulder, DO;  Location: [facility] OR;  Service: Neurosurgery;  Laterality: N/A;   SHOULDER ARTHROSCOPY Left 10/27/2019   Procedure: left shoulder arthroscopy with arthroscopic vs open distal clavical excision;  Surgeon: Cammy Copa, MD;  Location: [facility] OR;  Service: Orthopedics;  Laterality: Left;   SHOULDER SURGERY     right   SPINE SURGERY     x 5   TOE FUSION Left 09/2016   TOTAL HIP ARTHROPLASTY Left 11/09/2018   Procedure: LEFT TOTAL HIP ARTHROPLASTY DIRECT ANTERIOR APPROACH;  Surgeon: Cammy Copa, MD;  Location: [facility] ORS;  Service: Orthopedics;  Laterality: Left;   TOTAL KNEE ARTHROPLASTY Left 10/07/2022   Procedure: LEFT TOTAL KNEE ARTHROPLASTY;  Surgeon: Cammy Copa, MD;  Location: [facility] OR;  Service: Orthopedics;  Laterality: Left;   Patient Active Problem List   Diagnosis Date Noted   Arthritis of left knee  10/26/2022   S/P total knee replacement, left 10/07/2022   Rosacea 09/04/2022   Acute mucoid otitis media of left ear 09/04/2022   Pseudarthrosis after fusion or arthrodesis 06/17/2022   Acute non-recurrent frontal sinusitis 04/08/2022   Oral aphthous ulcer 02/12/2022   Primary osteoarthritis of both knees 02/12/2022   Cervical spondylosis 09/05/2021   Tenosynovitis, de Quervain 08/01/2020   Right hand pain 08/01/2020   Central spinal stenosis 05/01/2020   H/O cardiac arrest 05/01/2020   Asthma 05/01/2020   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   Hip arthritis 11/09/2018   Lumbar post-laminectomy syndrome 08/12/2017   Pain in thoracic spine 05/13/2017   PTSD  (post-traumatic stress disorder) 04/13/2017   Hallux rigidus, left foot 09/15/2016   Hallux rigidus, right foot 09/15/2016   HNP (herniated nucleus pulposus), lumbar 04/16/2016   Acquired scoliosis 09/07/2015   Cervical spondylosis with radiculopathy 06/22/2015   Dysphagia 04/26/2014   Insomnia 01/04/2013    PCP: Trena Platt MD  REFERRING PROVIDER: Cammy Copa, MD  REFERRING DIAG: [phone] (ICD-10-CM) - Status post total left knee replacement  THERAPY DIAG:  Left knee pain, unspecified chronicity  Muscle weakness (generalized)  Other abnormalities of gait and mobility  Other symptoms and signs involving the musculoskeletal system  Rationale for Evaluation and Treatment: Rehabilitation  ONSET DATE: 10/07/22  SUBJECTIVE:   SUBJECTIVE STATEMENT: Patient states everything is going well. She had home health PT with the bending machine following. Went walking yesterday for about .25 miles which was tough for her. Has been walking around yard. Been doing HEP from HHPT. Faithful to do exercises.  PERTINENT HISTORY: 4 cervical and lumbar surgeries fusions, hx several L knee surgeriesL THA PAIN:  Are you having pain? Yes: NPRS scale: 8.5/10 Pain location: L knee Pain description: dull stabbing Aggravating factors: walking, bending Relieving factors: pain meds  PRECAUTIONS: None  WEIGHT BEARING RESTRICTIONS: No  FALLS:  Has patient fallen in last 6 months? No  OCCUPATION: Retired  PLOF: Independent  PATIENT GOALS: get knee feeling better  NEXT MD VISIT: May 2024  OBJECTIVE:   PATIENT SURVEYS: FOTO time limited complete another session  COGNITION: Overall cognitive status: Within functional limits for tasks assessed     SENSATION: WFL  EDEMA: mild edema in L knee   POSTURE: No Significant postural limitations  PALPATION: Minimally L knee grossly   LOWER EXTREMITY ROM:  Active ROM Right eval Left eval  Hip flexion    Hip extension    Hip  abduction    Hip adduction    Hip internal rotation    Hip external rotation    Knee flexion 146 100  Knee extension 0 0  Ankle dorsiflexion    Ankle plantarflexion    Ankle inversion    Ankle eversion     (Blank rows = not tested)  LOWER EXTREMITY MMT:  MMT Right eval Left eval  Hip flexion 5 5  Hip extension    Hip abduction    Hip adduction    Hip internal rotation    Hip external rotation    Knee flexion 5 5  Knee extension 5 4+  Ankle dorsiflexion 5 4+  Ankle plantarflexion    Ankle inversion    Ankle eversion     (Blank rows = not tested)    FUNCTIONAL TESTS:  Transfer:: L knee extended slightly, relies RLE, labored Stairs: able to complete with alternating on 7 inch ascending but lacking flexion and concentric strength, step too descending due to apprehension/ROM/strength deficits on  L   GAIT: Distance walked: 100 feet Assistive device utilized: None Level of assistance: Modified independence Comments: limited L knee flexion/ extension   TODAY'S TREATMENT:                                                                                                                              DATE:  10/28/22 Eval and HEP education and review    PATIENT EDUCATION:  Education details: Patient educated on exam findings, POC, scope of PT, HEP, and gradual increases in activity. Person educated: Patient Education method: Explanation, Demonstration, and Handouts Education comprehension: verbalized understanding, returned demonstration, verbal cues required, and tactile cues required  HOME EXERCISE PROGRAM: Access Code: [email or web address] URL: [email or web address] Date: 10/28/2022 - Supine Quadricep Sets  - 2 x daily - 7 x weekly - 10 reps - 10 second hold - Active Straight Leg Raise with Quad Set (Mirrored)  - 2 x daily - 7 x weekly - 2 sets - 10 reps - Seated Long Arc Quad (Mirrored)  - 2 x daily - 7 x weekly - 1-2 sets - 10 reps - 5 second  hold - Supine Heel  Slide with Strap  - 2 x daily - 7 x weekly - 2 sets - 10 reps - 5-10 second hold - Mini Squat with Counter Support  - 2 x daily - 7 x weekly - 2 sets - 10 reps  ASSESSMENT:  CLINICAL IMPRESSION: Patient a 67 y.o. y.o. female who was seen today for physical therapy evaluation and treatment for s/p L TKA on 10/07/22. Patient presents with pain limited deficits in L knee strength, ROM, endurance, activity tolerance, gait, balance,  and functional mobility with ADL. Patient is having to modify and restrict ADL as indicated by outcome measure score as well as subjective information and objective measures which is affecting overall participation. Patient wanting to perform minimal rehab visits as she is motivated to complete HEP but educated on importance of follow ups to progress adequately for return to full function. Patient will benefit from skilled physical therapy in order to improve function and reduce impairment.  OBJECTIVE IMPAIRMENTS: Abnormal gait, decreased activity tolerance, decreased balance, decreased endurance, decreased mobility, difficulty walking, decreased ROM, decreased strength, improper body mechanics, and pain.   ACTIVITY LIMITATIONS: carrying, lifting, bending, standing, squatting, stairs, transfers, locomotion level, and caring for others  PARTICIPATION LIMITATIONS: meal prep, cleaning, laundry, shopping, community activity, and yard work  PERSONAL FACTORS: Past/current experiences, Time since onset of injury/illness/exacerbation, and 1-2 comorbidities: 4 lumbar and 4 cervical surgeries with chronic back pain, several knee surgeries, L THA  are also affecting patient's functional outcome.   REHAB POTENTIAL: Good  CLINICAL DECISION MAKING: Stable/uncomplicated  EVALUATION COMPLEXITY: Low   GOALS: Goals reviewed with patient? Yes  SHORT TERM GOALS: Target date: 11/11/2022    Patient will be independent with HEP in order to improve functional outcomes. Baseline: Goal  status: INITIAL  2.  Patient will report at  least 25% improvement in symptoms for improved quality of life. Baseline: Goal status: INITIAL    LONG TERM GOALS: Target date: 11/25/2022    Patient will report at least 75% improvement in symptoms for improved quality of life. Baseline:  Goal status: INITIAL  2.  Patient will improve FOTO score to predicted outcomes in order to indicate improved tolerance to activity. Baseline:  Goal status: INITIAL  3.  Patient will be able to navigate stairs with reciprocal pattern without compensation in order to demonstrate improved LE strength. Baseline: see above Goal status: INITIAL  4.  Patient will demonstrate grade of 5/5 MMT grade in all tested musculature as evidence of improved strength to assist with stair ambulation and gait.   Baseline: see above Goal status: INITIAL  5.  Patient will be able squat without without weight shift off LLE to demonstrate improved strength and knee flexion ROM.  Baseline: weight shift off LLE Goal status: INITIAL  6.  Patient will improve ROM for L  knee extension/flexion to 0-120 degrees to improve squatting, and other functional mobility. Baseline: 0-100 Goal status: INITIAL   PLAN:  PT FREQUENCY: 1x/week  PT DURATION: 4 weeks  PLANNED INTERVENTIONS: Therapeutic exercises, Therapeutic activity, Neuromuscular re-education, Balance training, Gait training, Patient/Family education, Joint manipulation, Joint mobilization, Stair training, Orthotic/Fit training, DME instructions, Aquatic Therapy, Dry Needling, Electrical stimulation, Spinal manipulation, Spinal mobilization, Cryotherapy, Moist heat, Compression bandaging, scar mobilization, Splintting, Taping, Traction, Ultrasound, Ionotophoresis 4mg /ml Dexamethasone, and Manual therapy  PLAN FOR NEXT SESSION: f/u with HEP, progress mobility and strength as able, f/u with squat/ stair mechanics   Reola Mosher Hayven Croy, PT 10/28/2022, 11:15 AM

## 2022-10-29 ENCOUNTER — Other Ambulatory Visit: Payer: Self-pay | Admitting: Surgical

## 2022-10-29 DIAGNOSIS — M180 Bilateral primary osteoarthritis of first carpometacarpal joints: Secondary | ICD-10-CM

## 2022-11-10 ENCOUNTER — Encounter (HOSPITAL_COMMUNITY): Payer: Self-pay

## 2022-11-10 ENCOUNTER — Ambulatory Visit (HOSPITAL_COMMUNITY): Payer: Medicare Other | Attending: Orthopedic Surgery | Admitting: Physical Therapy

## 2022-11-17 ENCOUNTER — Encounter: Payer: Self-pay | Admitting: Orthopedic Surgery

## 2022-11-17 ENCOUNTER — Ambulatory Visit (INDEPENDENT_AMBULATORY_CARE_PROVIDER_SITE_OTHER): Payer: Medicare Other | Admitting: Surgical

## 2022-11-17 ENCOUNTER — Telehealth: Payer: Self-pay | Admitting: *Deleted

## 2022-11-17 DIAGNOSIS — Z96652 Presence of left artificial knee joint: Secondary | ICD-10-CM

## 2022-11-17 NOTE — Telephone Encounter (Signed)
Ortho bundle in office visit completed. 

## 2022-11-23 ENCOUNTER — Encounter: Payer: Self-pay | Admitting: Surgical

## 2022-11-23 NOTE — Progress Notes (Signed)
Post-Op Visit Note   Patient: Stephanie Noble           Date of Birth: 05-03-1956           MRN: [id number] Visit Date: 11/17/2022 PCP: Anabel Halon, MD   Assessment & Plan:  Chief Complaint:  Chief Complaint  Patient presents with   Left Knee - Follow-up    Left total knee arthroplasty 10/07/2022   Visit Diagnoses: No diagnosis found.  Plan: Patient is a 67 year old female who presents s/p left total knee arthroplasty on 10/07/2022.  She is about 6 weeks postop at this point.  She feels she is doing very well.  She really only went to 1 physical therapy visit but she has been very diligently forming home exercise program today and in the out.  Taking oxycodone only as needed.  On exam, patient has 0 degrees extension and 120 degrees knee flexion.  Excellent quad strength rated 5/5.  No calf tenderness.  Intact ankle dorsiflexion plantarflexion.  Palpable DP pulse.  She has incision that is well-healed without evidence of infection or dehiscence.  No knee effusion noted.  Plan is to continue with her physical therapy exercises and follow-up for final check in 6 weeks with Dr. August Saucer.  Follow-Up Instructions: No follow-ups on file.   Orders:  No orders of the defined types were placed in this encounter.  No orders of the defined types were placed in this encounter.   Imaging: No results found.  PMFS History: Patient Active Problem List   Diagnosis Date Noted   Arthritis of left knee 10/26/2022   S/P total knee replacement, left 10/07/2022   Rosacea 09/04/2022   Acute mucoid otitis media of left ear 09/04/2022   Pseudarthrosis after fusion or arthrodesis 06/17/2022   Acute non-recurrent frontal sinusitis 04/08/2022   Oral aphthous ulcer 02/12/2022   Primary osteoarthritis of both knees 02/12/2022   Cervical spondylosis 09/05/2021   Tenosynovitis, de Quervain 08/01/2020   Right hand pain 08/01/2020   Central spinal stenosis 05/01/2020   H/O cardiac arrest 05/01/2020    Asthma 05/01/2020   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   Hip arthritis 11/09/2018   Lumbar post-laminectomy syndrome 08/12/2017   Pain in thoracic spine 05/13/2017   PTSD (post-traumatic stress disorder) 04/13/2017   Hallux rigidus, left foot 09/15/2016   Hallux rigidus, right foot 09/15/2016   HNP (herniated nucleus pulposus), lumbar 04/16/2016   Acquired scoliosis 09/07/2015   Cervical spondylosis with radiculopathy 06/22/2015   Dysphagia 04/26/2014   Insomnia 01/04/2013   Past Medical History:  Diagnosis Date   Anxiety    Asthma    uses albuterol as needed "mostly weather induced".ALbuterol and Ventolin inhaler as needed   Chronic back pain    Chronic neck pain    Complication of anesthesia    post-op PEA arrest 04/16/16, thought due to respriatory arrest from sedation, narcotic, hypovolemia   Degenerative disc disease    cervical and lumbar area   Depression    History of bronchitis 2016   History of cervical spinal surgery 12/30/2019   History of lumbar surgery 12/30/2019   Insomnia    takes Ambien nightly   Muscle spasm    takes Robaxin daily    Pneumonia    hx of-2016   PONV (postoperative nausea and vomiting)    Weakness    numbness and tingling in legs    Family History  Problem Relation Age of Onset   Dementia Mother    Other Sister  Breast cancer Sister    Alcohol abuse Other    Breast cancer Paternal Aunt     Past Surgical History:  Procedure Laterality Date   ANTERIOR CERVICAL DECOMP/DISCECTOMY FUSION N/A 09/05/2021   Procedure: CERVICAL THREE-FOUR ANTERIOR CERVICAL DECOMPRESSION/DISCECTOMY FUSION WITH PARTIAL PLATE REMOVAL AT CERVICAL FOUR-FIVE;  Surgeon: Bethann Goo, DO;  Location: [facility] OR;  Service: Neurosurgery;  Laterality: N/A;  3C   BACK SURGERY     x 3 lumbar   CHOLECYSTECTOMY     DILATION AND CURETTAGE OF UTERUS     x 2   JOINT REPLACEMENT     KNEE SURGERY Left    x 2   left knee arthroscopy      x 2   POSTERIOR CERVICAL FUSION/FORAMINOTOMY   05/17/2012   Procedure: POSTERIOR CERVICAL FUSION/FORAMINOTOMY LEVEL 2;  Surgeon: Hewitt Shorts, MD;  Location: [facility];  Service: Neurosurgery;  Laterality: N/A;  cervical six to Thoracic One posterior cervical arthrodesis with instrumentation   POSTERIOR CERVICAL FUSION/FORAMINOTOMY N/A 06/17/2022   Procedure: CERVICAL TWO-THREE, CERVICAL THREE-FOUR, CERVICAL FOUR-FIVE POSTERIOR CERVICAL INSTRUMENTATION AND FUSION WITH EXTENSION TO CERVICAL SIX-THORACIC ONE FUSION;  Surgeon: Dawley, Alan Mulder, DO;  Location: [facility] OR;  Service: Neurosurgery;  Laterality: N/A;   SHOULDER ARTHROSCOPY Left 10/27/2019   Procedure: left shoulder arthroscopy with arthroscopic vs open distal clavical excision;  Surgeon: Cammy Copa, MD;  Location: [facility] OR;  Service: Orthopedics;  Laterality: Left;   SHOULDER SURGERY     right   SPINE SURGERY     x 5   TOE FUSION Left 09/2016   TOTAL HIP ARTHROPLASTY Left 11/09/2018   Procedure: LEFT TOTAL HIP ARTHROPLASTY DIRECT ANTERIOR APPROACH;  Surgeon: Cammy Copa, MD;  Location: [facility] ORS;  Service: Orthopedics;  Laterality: Left;   TOTAL KNEE ARTHROPLASTY Left 10/07/2022   Procedure: LEFT TOTAL KNEE ARTHROPLASTY;  Surgeon: Cammy Copa, MD;  Location: [facility] OR;  Service: Orthopedics;  Laterality: Left;   Social History   Occupational History   Not on file  Tobacco Use   Smoking status: Never   Smokeless tobacco: Never  Vaping Use   Vaping Use: Never used  Substance and Sexual Activity   Alcohol use: No   Drug use: No   Sexual activity: Yes    Birth control/protection: Post-menopausal

## 2022-12-02 ENCOUNTER — Encounter: Payer: Self-pay | Admitting: Internal Medicine

## 2022-12-02 ENCOUNTER — Ambulatory Visit (INDEPENDENT_AMBULATORY_CARE_PROVIDER_SITE_OTHER): Payer: Medicare Other | Admitting: Internal Medicine

## 2022-12-02 VITALS — BP 135/84 | HR 69 | Temp 98.6°F | Resp 16 | Ht 65.5 in | Wt 146.0 lb

## 2022-12-02 DIAGNOSIS — R5383 Other fatigue: Secondary | ICD-10-CM | POA: Insufficient documentation

## 2022-12-02 DIAGNOSIS — W57XXXA Bitten or stung by nonvenomous insect and other nonvenomous arthropods, initial encounter: Secondary | ICD-10-CM | POA: Diagnosis not present

## 2022-12-02 DIAGNOSIS — J302 Other seasonal allergic rhinitis: Secondary | ICD-10-CM

## 2022-12-02 MED ORDER — DOXYCYCLINE HYCLATE 100 MG PO TABS: 200.0000 mg | ORAL_TABLET | Freq: Once | ORAL | 0 refills | Status: AC

## 2022-12-02 MED ORDER — FLUTICASONE PROPIONATE 50 MCG/ACT NA SUSP: 1.0000 | Freq: Every day | NASAL | 6 refills | Status: DC

## 2022-12-02 NOTE — Assessment & Plan Note (Signed)
Patient removed tick off her 2 days ago. Unsure how long tick was on her. Will treat with prophylactic Doxycyline 200 mg once.

## 2022-12-02 NOTE — Assessment & Plan Note (Addendum)
Broad differential for fatigue.  This could be related to her sleeping only 6 hours per night,but patient stressed she has been sleeping very little most of her life and not having this feeling. This also could be related to patient stress. PHQ9 was a 6 which doesn't suggest depression as cause, but when talking with patient I suspect her stressors could be playing some part in fatigue.  Will test for Mono and Covid. Check CBC to rule out anemia and neoplastic causes.   Starting treatment for allergies today.  Further recommendations based on workup. May need to further address sleep

## 2022-12-02 NOTE — Assessment & Plan Note (Signed)
Patient has allergy listed to hydroxyzine and reports cardiac arrest with medication.  Start Flonase and recommend nasal rinses before use.

## 2022-12-02 NOTE — Patient Instructions (Addendum)
Thank you, Ms.Tiara L Zynda for allowing [place] to provide your care today.   We will test for mono and Covid today.  I prescribed Flonase for allergies.  Use a saline nasal rinse for your sinuses before using Flonase.  This helps the medication work better and also helps with congestion.  I prescribed 200 mg doxycycline to take once for prophylactic treatment of tick bite.  I will follow-up with the results of your CBC    Thurmon Fair, M.D.

## 2022-12-02 NOTE — Progress Notes (Signed)
HPI:[name] is a 67 y.o. female who presents for evaluation of Tick Removal (Removed about a dozen ticks over the past few couple months. Has been very fatigued over the past 2 weeks. Usually has a lot of energy so this is not like her ) Patient started having abnormal fatigue 3 weeks ago. She first noticed she was feeling more tired than usual and then it was also noticed by her husband. She works in the yard often and has not had energy to go back out after lunch. She has removed multiple ticks, but no rash. She is unsure if she had one on her for 72 hours or greater.  Occasional cough, but she has asthma and this has not been unusual. Her grandchild has had Mono.  She has chronic insomnia. Sleeping about 6 hours a night with Ambien. Most of her life she has been able to function with this amount of sleep. Duloxetine, Ambien, and Gabapentin are chronic medications and never has experienced a tired feeling while taking them. She has had 2 surgeries in the past 6 months. She has a opioid pain medication but has taken this medication once a week at most. She has had increased stress with family dynamics. Her children have been going through a divorce. She has history of asthma and seasonal allergies. Not on a allergy medication at this time. She has had some congestion and rhinorrhea. No bleeding. No fever, night sweats, or weight loss. No joint pain. No rash. No orthopnea or PND. No lower extremity swelling.   Physical Exam: Vitals:   12/02/22 0930  BP: 135/84  Pulse: 69  Resp: 16  Temp: 98.6 F (37 C)  TempSrc: Oral  SpO2: 97%  Weight: 146 lb (66.2 kg)  Height: 5' 5.5" (1.664 m)     Physical Exam HENT:     Right Ear: Tympanic membrane is injected. Tympanic membrane is not bulging.     Left Ear: Tympanic membrane normal.     Nose: Congestion present.  Eyes:     Conjunctiva/sclera: Conjunctivae normal.  Cardiovascular:     Rate and Rhythm: Normal rate and regular rhythm.      Heart sounds: No murmur heard. Pulmonary:     Effort: Pulmonary effort is normal. No respiratory distress.     Breath sounds: No wheezing or rales.  Musculoskeletal:     Right lower leg: No edema.     Left lower leg: No edema.  Psychiatric:        Mood and Affect: Mood and affect normal.        Speech: Speech normal.        Behavior: Behavior normal.      Assessment & Plan:   Stephanie Noble was seen today for tick removal.  Fatigue, unspecified type Assessment & Plan: Broad differential for fatigue.  This could be related to her sleeping only 6 hours per night,but patient stressed she has been sleeping very little most of her life and not having this feeling. This also could be related to patient stress. PHQ9 was a 6 which doesn't suggest depression as cause, but when talking with patient I suspect her stressors could be playing some part in fatigue.  Will test for Mono and Covid. Check CBC to rule out anemia and neoplastic causes.   Starting treatment for allergies today.  Further recommendations based on workup. May need to further address sleep  Orders: -     CBC with Differential/Platelet -     EPSTEIN-BARR  VIRUS (EBV) Antibody Profile -     Novel Coronavirus, NAA ([facility])  Seasonal allergies Assessment & Plan: Patient has allergy listed to hydroxyzine and reports cardiac arrest with medication.  Start Flonase and recommend nasal rinses before use.    Tick bite, unspecified site, initial encounter Assessment & Plan: Patient removed tick off her 2 days ago. Unsure how long tick was on her. Will treat with prophylactic Doxycyline 200 mg once.    Other orders -     Doxycycline Hyclate; Take 2 tablets (200 mg total) by mouth once for 1 dose. 1 po bid  Dispense: 2 tablet; Refill: 0 -     Fluticasone Propionate; Place 1 spray into both nostrils daily.  Dispense: 15.8 mL; Refill: 6      Milus Banister, MD

## 2022-12-03 LAB — CBC WITH DIFFERENTIAL/PLATELET
Basophils Absolute: 0 10*3/uL (ref 0.0–0.2)
Basos: 1 %
EOS (ABSOLUTE): 0 10*3/uL (ref 0.0–0.4)
Eos: 1 %
Hematocrit: 43.5 % (ref 34.0–46.6)
Hemoglobin: 13.7 g/dL (ref 11.1–15.9)
Immature Grans (Abs): 0 10*3/uL (ref 0.0–0.1)
Immature Granulocytes: 0 %
Lymphocytes Absolute: 1.1 10*3/uL (ref 0.7–3.1)
Lymphs: 31 %
MCH: 29.8 pg (ref 26.6–33.0)
MCHC: 31.5 g/dL (ref 31.5–35.7)
MCV: 95 fL (ref 79–97)
Monocytes Absolute: 0.3 10*3/uL (ref 0.1–0.9)
Monocytes: 9 %
Neutrophils Absolute: 2 10*3/uL (ref 1.4–7.0)
Neutrophils: 58 %
Platelets: 250 10*3/uL (ref 150–450)
RBC: 4.6 x10E6/uL (ref 3.77–5.28)
RDW: 12.7 % (ref 11.7–15.4)
WBC: 3.4 10*3/uL (ref 3.4–10.8)

## 2022-12-04 LAB — NOVEL CORONAVIRUS, NAA: SARS-CoV-2, NAA: NOT DETECTED

## 2022-12-04 LAB — EBV VCA/EA AB, IGG
EBV Early Antigen Ab, IgG: 49.5 U/mL — ABNORMAL HIGH (ref 0.0–8.9)
EBV VCA IgG: 116 U/mL — ABNORMAL HIGH (ref 0.0–17.9)

## 2022-12-10 ENCOUNTER — Encounter: Payer: Self-pay | Admitting: Internal Medicine

## 2022-12-10 ENCOUNTER — Ambulatory Visit: Payer: Medicare Other | Admitting: Internal Medicine

## 2022-12-16 ENCOUNTER — Other Ambulatory Visit: Payer: Self-pay | Admitting: Internal Medicine

## 2022-12-16 DIAGNOSIS — M4722 Other spondylosis with radiculopathy, cervical region: Secondary | ICD-10-CM

## 2022-12-16 DIAGNOSIS — Z9889 Other specified postprocedural states: Secondary | ICD-10-CM

## 2022-12-25 ENCOUNTER — Other Ambulatory Visit: Payer: Self-pay | Admitting: Orthopedic Surgery

## 2022-12-25 MED ORDER — OXYCODONE HCL 5 MG PO TABS: 5.0000 mg | ORAL_TABLET | Freq: Four times a day (QID) | ORAL | 0 refills | Status: DC | PRN

## 2022-12-28 ENCOUNTER — Other Ambulatory Visit: Payer: Self-pay | Admitting: Surgical

## 2022-12-28 DIAGNOSIS — M180 Bilateral primary osteoarthritis of first carpometacarpal joints: Secondary | ICD-10-CM

## 2022-12-29 ENCOUNTER — Encounter: Payer: Self-pay | Admitting: Internal Medicine

## 2022-12-29 ENCOUNTER — Ambulatory Visit (INDEPENDENT_AMBULATORY_CARE_PROVIDER_SITE_OTHER): Payer: Medicare Other | Admitting: Internal Medicine

## 2022-12-29 VITALS — BP 119/81 | HR 73 | Ht 64.0 in | Wt 145.0 lb

## 2022-12-29 DIAGNOSIS — S90452A Superficial foreign body, left great toe, initial encounter: Secondary | ICD-10-CM | POA: Diagnosis not present

## 2022-12-29 NOTE — Patient Instructions (Signed)
It should take a few days for the skin to heal where the incision was made. If it becomes red , hot , or swollen then let me know and I will send an antibiotic. You must have gotten most of the splinter out. The skin will grow and you can keep it soften with Vaseline or corn remover Band-Aid.   I recommend following up with Dr. Margo Aye to have the area on top of your toe shaved.

## 2022-12-29 NOTE — Progress Notes (Signed)
   HPI:Stephanie Noble is a 67 y.o. female who presents for evaluation of foreign object on left big toe. Patient goes bear footed often and had a splinter 2 months ago. She has had some tenderness and thickened skin on plantar surface. Her husband has tried to removed and possibly had incomplete removal.   Her She also has a lesion on top of her toe which was previously shaved by Dr.Hall, dermatologist. Appears to be digital mucous cyst. We do not have shave biopsy blade supplies , so patient is going to follow up with Dr.Hall.     Physical Exam: Vitals:   12/29/22 1043  BP: 119/81  Pulse: 73  SpO2: 98%  Weight: 145 lb 0.6 oz (65.8 kg)  Height: 5\' 4"  (1.626 m)     Physical Exam Constitutional:      General: She is not in acute distress.    Appearance: She is not ill-appearing.  Skin:    Comments: Pinpoint brown discoloration in center of left great toe. Dime size area of hardened skin surrounding discoloration. No erythema. Mild TTP.        Assessment & Plan:   Lichelle was seen today for follow-up.  Foreign body of skin of left great toe Assessment & Plan: Acute, uncomplicated problem Patient had pinpoint discoloration of great toe. Patient verbally consented for incision and removal of foreign body in left great toe. The skin overlying the splinter was cleaned with povidone iodine solution and infiltrated with 1 ml of 1% lidocaine.  A #11 scalpel blade was used to incise the skin along the length of the splinter exposing it.  There was no deep splinter that extended below the discoloration in the dermis. Patient had minimal bleeding. Sterile bandage applied. Patient tolerated procedure well without complications.   Recommendation is to keep area clean and let incision heal. The fragments will grow out of dermis. Patient can soften skin with corn remover or Vaseline at night. Follow up if area becomes red, hot , or swollen.    Orders: -     Lidocaine HCl  (PF)      Milus Banister, MD

## 2022-12-30 DIAGNOSIS — S90452A Superficial foreign body, left great toe, initial encounter: Secondary | ICD-10-CM | POA: Insufficient documentation

## 2022-12-30 MED ORDER — LIDOCAINE HCL (PF) 1 % IJ SOLN
1.0000 mL | Freq: Once | INTRAMUSCULAR | Status: AC
Start: 2022-12-30 — End: ?

## 2022-12-30 NOTE — Assessment & Plan Note (Signed)
Acute, uncomplicated problem Patient had pinpoint discoloration of great toe. Patient verbally consented for incision and removal of foreign body in left great toe. The skin overlying the splinter was cleaned with povidone iodine solution and infiltrated with 1 ml of 1% lidocaine.  A #11 scalpel blade was used to incise the skin along the length of the splinter exposing it.  There was no deep splinter that extended below the discoloration in the dermis. Patient had minimal bleeding. Sterile bandage applied. Patient tolerated procedure well without complications.   Recommendation is to keep area clean and let incision heal. The fragments will grow out of dermis. Patient can soften skin with corn remover or Vaseline at night. Follow up if area becomes red, hot , or swollen.

## 2023-01-13 ENCOUNTER — Encounter: Payer: Self-pay | Admitting: Internal Medicine

## 2023-01-13 ENCOUNTER — Ambulatory Visit (INDEPENDENT_AMBULATORY_CARE_PROVIDER_SITE_OTHER): Payer: Medicare Other | Admitting: Internal Medicine

## 2023-01-13 VITALS — BP 100/62 | HR 77 | Ht 64.0 in | Wt 146.0 lb

## 2023-01-13 DIAGNOSIS — J4520 Mild intermittent asthma, uncomplicated: Secondary | ICD-10-CM

## 2023-01-13 DIAGNOSIS — F5101 Primary insomnia: Secondary | ICD-10-CM | POA: Diagnosis not present

## 2023-01-13 DIAGNOSIS — M170 Bilateral primary osteoarthritis of knee: Secondary | ICD-10-CM

## 2023-01-13 DIAGNOSIS — Z9889 Other specified postprocedural states: Secondary | ICD-10-CM | POA: Diagnosis not present

## 2023-01-13 DIAGNOSIS — M5126 Other intervertebral disc displacement, lumbar region: Secondary | ICD-10-CM

## 2023-01-13 DIAGNOSIS — M4722 Other spondylosis with radiculopathy, cervical region: Secondary | ICD-10-CM | POA: Diagnosis not present

## 2023-01-13 DIAGNOSIS — M26609 Unspecified temporomandibular joint disorder, unspecified side: Secondary | ICD-10-CM | POA: Insufficient documentation

## 2023-01-13 DIAGNOSIS — Z96652 Presence of left artificial knee joint: Secondary | ICD-10-CM

## 2023-01-13 MED ORDER — ZOLPIDEM TARTRATE ER 12.5 MG PO TBCR
12.5000 mg | EXTENDED_RELEASE_TABLET | Freq: Every evening | ORAL | 0 refills | Status: DC | PRN
Start: 2023-01-13 — End: 2023-03-04

## 2023-01-13 MED ORDER — DULOXETINE HCL 20 MG PO CPEP
20.0000 mg | ORAL_CAPSULE | Freq: Two times a day (BID) | ORAL | 1 refills | Status: DC
Start: 2023-01-13 — End: 2023-07-20

## 2023-01-13 MED ORDER — CELECOXIB 100 MG PO CAPS
100.0000 mg | ORAL_CAPSULE | Freq: Two times a day (BID) | ORAL | 3 refills | Status: DC
Start: 2023-01-13 — End: 2023-04-27

## 2023-01-13 NOTE — Assessment & Plan Note (Signed)
Had ENT evaluation for ear pain, was told of TMJ dysfunction Needs to follow-up with dental surgeon/oral surgeon

## 2023-01-13 NOTE — Assessment & Plan Note (Signed)
For OA of left knee Has been doing exercises at home Has chronic knee, neck and back pain -meloxicam ineffective, check switched to Celebrex

## 2023-01-13 NOTE — Assessment & Plan Note (Addendum)
Uncontrolled with Ambien 10 mg qHS Started Ambien CR 12.5 mg qHS PDMP reviewed, refilled Ambien

## 2023-01-13 NOTE — Assessment & Plan Note (Signed)
Tylenol as needed for knee pain Still has episodes of severe hip and knee pain -meloxicam ineffective, check switched to Celebrex Followed by orthopedic surgeon

## 2023-01-13 NOTE — Patient Instructions (Signed)
Please take Celecoxib up to twice daily as needed for knee pain. Please alternate with Tylenol arthritis. Please stop taking Meloxicam.  Please start taking Cymbalta 20 mg twice daily.  Please start taking Ambien 12.5 mg at bedtime instead of 10 mg.  Please continue to take other medications as prescribed.  Please continue to follow low salt diet and perform moderate exercise/walking at least 150 mins/week.

## 2023-01-13 NOTE — Progress Notes (Signed)
Established Patient Office Visit  Subjective:  Patient ID: Stephanie Noble, female    DOB: 29-Jan-1956  Age: 67 y.o. MRN: [id number]  CC:  Chief Complaint  Patient presents with   Insomnia    Patient is taking Remus Loffler and is not sleeping great    Arthritis    Patient is on meloxicam and is not getting any relief     HPI Stephanie Noble is a 67 y.o. 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  Patient has a longstanding history of insomnia, for which she has been taking Ambien 10 mg qHS.  She still complains of insomnia and is able to maintain sleep for up to 6 hours.  She denies any anhedonia, recent weight or appetite changes or suicidal ideation.  Patient has been having chronic neck pain, which is constant, variable in intensity, worse with movement and is associated with numbness and weakness in bilateral upper extremities.  Patient has had fusion surgery in 2013 and 2014. Patient also complains of lower back pain, which refers to the buttocks area.  Patient takes Robaxin for muscle spasms.  Patient takes gabapentin and Cymbalta for neuropathy pain.  Patient used to see pain specialist in the past.  She also complains of chronic knee pain and swelling, for which she takes Mobic as needed, but has not been helping her. She sees Dr August Saucer for OA of knee and had left TKA in 04/24.     Past Medical History:  Diagnosis Date   Anxiety    Asthma    uses albuterol as needed "mostly weather induced".ALbuterol and Ventolin inhaler as needed   Chronic back pain    Chronic neck pain    Complication of anesthesia    post-op PEA arrest 04/16/16, thought due to respriatory arrest from sedation, narcotic, hypovolemia   Degenerative disc disease    cervical and lumbar area   Depression    History of  bronchitis 2016   History of cervical spinal surgery 12/30/2019   History of lumbar surgery 12/30/2019   Insomnia    takes Ambien nightly   Muscle spasm    takes Robaxin daily    Pneumonia    hx of-2016   PONV (postoperative nausea and vomiting)    Weakness    numbness and tingling in legs    Past Surgical History:  Procedure Laterality Date   ANTERIOR CERVICAL DECOMP/DISCECTOMY FUSION N/A 09/05/2021   Procedure: CERVICAL THREE-FOUR ANTERIOR CERVICAL DECOMPRESSION/DISCECTOMY FUSION WITH PARTIAL PLATE REMOVAL AT CERVICAL FOUR-FIVE;  Surgeon: Bethann Goo, DO;  Location: [facility] OR;  Service: Neurosurgery;  Laterality: N/A;  3C   BACK SURGERY     x 3 lumbar   CHOLECYSTECTOMY     DILATION AND CURETTAGE OF UTERUS     x 2   JOINT REPLACEMENT     KNEE SURGERY Left    x 2   left knee arthroscopy      x 2   POSTERIOR CERVICAL FUSION/FORAMINOTOMY  05/17/2012   Procedure: POSTERIOR CERVICAL FUSION/FORAMINOTOMY LEVEL 2;  Surgeon: Hewitt Shorts, MD;  Location: [facility];  Service: Neurosurgery;  Laterality: N/A;  cervical six to Thoracic One posterior cervical arthrodesis with instrumentation   POSTERIOR CERVICAL FUSION/FORAMINOTOMY N/A 06/17/2022   Procedure: CERVICAL TWO-THREE, CERVICAL THREE-FOUR, CERVICAL FOUR-FIVE POSTERIOR CERVICAL INSTRUMENTATION AND FUSION WITH  EXTENSION TO CERVICAL SIX-THORACIC ONE FUSION;  Surgeon: Dawley, Alan Mulder, DO;  Location: [facility] OR;  Service: Neurosurgery;  Laterality: N/A;   SHOULDER ARTHROSCOPY Left 10/27/2019   Procedure: left shoulder arthroscopy with arthroscopic vs open distal clavical excision;  Surgeon: Cammy Copa, MD;  Location: [facility] OR;  Service: Orthopedics;  Laterality: Left;   SHOULDER SURGERY     right   SPINE SURGERY     x 5   TOE FUSION Left 09/2016   TOTAL HIP ARTHROPLASTY Left 11/09/2018   Procedure: LEFT TOTAL HIP ARTHROPLASTY DIRECT ANTERIOR APPROACH;  Surgeon: Cammy Copa, MD;  Location: [facility] ORS;  Service: Orthopedics;   Laterality: Left;   TOTAL KNEE ARTHROPLASTY Left 10/07/2022   Procedure: LEFT TOTAL KNEE ARTHROPLASTY;  Surgeon: Cammy Copa, MD;  Location: [facility] OR;  Service: Orthopedics;  Laterality: Left;    Family History  Problem Relation Age of Onset   Dementia Mother    Other Sister    Breast cancer Sister    Alcohol abuse Other    Breast cancer Paternal Aunt     Social History   Socioeconomic History   Marital status: Married    Spouse name: Not on file   Number of children: Not on file   Years of education: Not on file   Highest education level: Not on file  Occupational History   Not on file  Tobacco Use   Smoking status: Never   Smokeless tobacco: Never  Vaping Use   Vaping status: Never Used  Substance and Sexual Activity   Alcohol use: No   Drug use: No   Sexual activity: Yes    Birth control/protection: Post-menopausal  Other Topics Concern   Not on file  Social History Narrative   Not on file   Social Determinants of Health   Financial Resource Strain: Not on file  Food Insecurity: No Food Insecurity (10/07/2022)   Hunger Vital Sign    Worried About Running Out of Food in the Last Year: Never true    Ran Out of Food in the Last Year: Never true  Transportation Needs: No Transportation Needs (10/07/2022)   PRAPARE - Administrator, Civil Service (Medical): No    Lack of Transportation (Non-Medical): No  Physical Activity: Not on file  Stress: No Stress Concern Present (07/13/2019)   Received from [facility] - Occupational Stress Questionnaire    Feeling of Stress : Not at all  Social Connections: Not on file  Intimate Partner Violence: Not At Risk (10/07/2022)   Humiliation, Afraid, Rape, and Kick questionnaire    Fear of Current or Ex-Partner: No    Emotionally Abused: No    Physically Abused: No    Sexually Abused: No    Outpatient Medications Prior to Visit  Medication Sig  Dispense Refill   albuterol (PROVENTIL) (2.5 MG/3ML) 0.083% nebulizer solution Take 3 mLs (2.5 mg total) by nebulization every 4 (four) hours as needed for wheezing or shortness of breath. 150 mL 5   albuterol (VENTOLIN HFA) 108 (90 Base) MCG/ACT inhaler Inhale 2 sprays QID prn wheezing (Patient taking differently: Inhale 2 puffs into the lungs 4 (four) times daily as needed for wheezing or shortness of breath.) 18 g 0   aspirin 81 MG chewable tablet Chew 1 tablet (81 mg total) by mouth 2 (two) times daily. 60 tablet 0   docusate sodium (COLACE) 100 MG capsule Take 1 capsule (  100 mg total) by mouth 2 (two) times daily. 10 capsule 0   fluticasone (FLONASE) 50 MCG/ACT nasal spray Place 1 spray into both nostrils daily. 15.8 mL 6   folic acid (FOLVITE) 1 MG tablet Take 1 tablet (1 mg total) by mouth daily. 90 tablet 3   gabapentin (NEURONTIN) 300 MG capsule TAKE 2 CAPSULE IN THE MORNING , 2 AT LUNCH AND 3 AT BEDTIME. 210 capsule 2   methocarbamol (ROBAXIN) 750 MG tablet TAKE 1 TABLET BY MOUTH THREE TIMES A DAY AS NEEDED 90 tablet 2   mupirocin ointment (BACTROBAN) 2 % Apply 1 Application topically 2 (two) times daily as needed (wound care).     Omega-3 Fatty Acids (FISH OIL) 1200 MG CAPS Take 1,200 mg by mouth 2 (two) times a week.     oxyCODONE (OXY IR/ROXICODONE) 5 MG immediate release tablet Take 1 tablet (5 mg total) by mouth every 6 (six) hours as needed for severe pain. 30 tablet 0   Propylene Glycol (SYSTANE BALANCE) 0.6 % SOLN Place 1 drop into both eyes 2 (two) times daily as needed (dry eyes).     DULoxetine (CYMBALTA) 20 MG capsule Take 1 capsule (20 mg total) by mouth daily. 90 capsule 1   meloxicam (MOBIC) 7.5 MG tablet TAKE 1 TABLET BY MOUTH EVERY DAY 30 tablet 1   ofloxacin (FLOXIN) 0.3 % OTIC solution Place 5 drops into the left ear daily. 5 mL 0   zolpidem (AMBIEN) 10 MG tablet TAKE 1 TABLET BY MOUTH AT BEDTIME AS NEEDED FOR SLEEP. 30 tablet 3   Facility-Administered Medications Prior  to Visit  Medication Dose Route Frequency Provider Last Rate Last Admin   lidocaine (PF) (XYLOCAINE) 1 % injection 1 mL  1 mL Intradermal Once Gardenia Phlegm, MD        Allergies  Allergen Reactions   Vistaril [Hydroxyzine Hcl] Other (See Comments)    Pt went into cardiac arrest    Hydromorphone Hcl Hives    Per report from PACU RN, pt had itching on arrival to pacu then developed hives after dilaudid given. Discussed with Dr. Jake Samples and dilaudid allergy being added to record (09/06/21)   Dilaudid [Hydromorphone] Rash    ROS Review of Systems  Constitutional:  Negative for chills and fever.  HENT:  Positive for ear pain (Left). Negative for congestion, sinus pressure, sinus pain and sore throat.   Eyes:  Negative for pain and discharge.  Respiratory:  Negative for cough and shortness of breath.   Cardiovascular:  Negative for chest pain and palpitations.  Gastrointestinal:  Negative for abdominal pain, constipation, diarrhea, nausea and vomiting.  Endocrine: Negative for polydipsia and polyuria.  Genitourinary:  Negative for dysuria and hematuria.  Musculoskeletal:  Positive for arthralgias, back pain and neck pain. Negative for neck stiffness.       B/l hand pain Knee pain  Skin:  Negative for rash.  Neurological:  Positive for weakness (B/l UE) and numbness (B/l UE). Negative for dizziness and headaches.  Psychiatric/Behavioral:  Negative for agitation and behavioral problems.       Objective:    Physical Exam Vitals reviewed.  Constitutional:      General: She is not in acute distress.    Appearance: She is not diaphoretic.  HENT:     Head: Normocephalic and atraumatic.     Nose: Nose normal.     Mouth/Throat:     Mouth: Mucous membranes are dry. Oral lesions present.  Eyes:  General: No scleral icterus.    Extraocular Movements: Extraocular movements intact.     Pupils: Pupils are equal, round, and reactive to light.  Neck:     Comments: Well-healed scar in the  cervical (neck) area Cardiovascular:     Rate and Rhythm: Normal rate and regular rhythm.     Pulses: Normal pulses.     Heart sounds: Normal heart sounds. No murmur heard.    No friction rub. No gallop.  Pulmonary:     Breath sounds: Normal breath sounds. No wheezing or rales.  Musculoskeletal:     Cervical back: Neck supple. Tenderness present.     Right lower leg: No edema.     Left lower leg: No edema.  Skin:    General: Skin is warm.     Findings: Erythema (Over nose) present. No rash.  Neurological:     General: No focal deficit present.     Mental Status: She is alert and oriented to person, place, and time.     Sensory: No sensory deficit.     Motor: No weakness.  Psychiatric:        Mood and Affect: Mood normal.        Behavior: Behavior normal.     BP 100/62 (BP Location: Left Arm, Patient Position: Sitting, Cuff Size: Normal)   Pulse 77   Ht 5\' 4"  (1.626 m)   Wt 146 lb (66.2 kg)   SpO2 96%   BMI 25.06 kg/m  Wt Readings from Last 3 Encounters:  01/13/23 146 lb (66.2 kg)  12/29/22 145 lb 0.6 oz (65.8 kg)  12/02/22 146 lb (66.2 kg)    Lab Results  Component Value Date   TSH 4.000 06/26/2017   Lab Results  Component Value Date   WBC 3.4 12/02/2022   HGB 13.7 12/02/2022   HCT 43.5 12/02/2022   MCV 95 12/02/2022   PLT 250 12/02/2022   Lab Results  Component Value Date   NA 139 09/25/2022   K 4.3 09/25/2022   CO2 29 09/25/2022   GLUCOSE 82 09/25/2022   BUN 13 09/25/2022   CREATININE 0.83 09/25/2022   BILITOT 0.5 04/17/2016   ALKPHOS 39 04/17/2016   AST 87 (H) 04/17/2016   ALT 71 (H) 04/17/2016   PROT 5.6 (L) 04/17/2016   ALBUMIN 3.2 (L) 04/17/2016   CALCIUM 9.6 09/25/2022   ANIONGAP 10 09/25/2022   No results found for: "CHOL" No results found for: "HDL" No results found for: "LDLCALC" No results found for: "TRIG" No results found for: "CHOLHDL" No results found for: "HGBA1C"    Assessment & Plan:   Problem List Items Addressed This  Visit       Respiratory   Asthma    Uses Albuterol inhaler PRN, uses it occasionally Continue Flovent inhaler - advised to rinse mouth after use for proper oral care        Nervous and Auditory   Cervical spondylosis with radiculopathy    S/p C3-C4 decompression with fusion surgery Has persistent neck pain On gabapentin, Cymbalta and Robaxin Increased dose of Cymbalta to 20 mg BID Followed by Spine Surgery      Relevant Medications   zolpidem (AMBIEN CR) 12.5 MG CR tablet   DULoxetine (CYMBALTA) 20 MG capsule   celecoxib (CELEBREX) 100 MG capsule     Musculoskeletal and Integument   HNP (herniated nucleus pulposus), lumbar - Primary    S/p lumbar spine surgery Has sciatica pain, referral to Spine surgery was provided in  the past Continue Robaxin for muscle spasms On gabapentin 600 mg QAM and at lunch, and 900 mg nightly      Primary osteoarthritis of both knees    Tylenol as needed for knee pain Still has episodes of severe hip and knee pain -meloxicam ineffective, check switched to Celebrex Followed by orthopedic surgeon      Relevant Medications   celecoxib (CELEBREX) 100 MG capsule   TMJ dysfunction    Had ENT evaluation for ear pain, was told of TMJ dysfunction Needs to follow-up with dental surgeon/oral surgeon        Other   Insomnia    Uncontrolled with Ambien 10 mg qHS Started Ambien CR 12.5 mg qHS PDMP reviewed, refilled Ambien      Relevant Medications   zolpidem (AMBIEN CR) 12.5 MG CR tablet   S/P total knee replacement, left    For OA of left knee Has been doing exercises at home Has chronic knee, neck and back pain -meloxicam ineffective, check switched to Celebrex      Other Visit Diagnoses     History of cervical spinal surgery       Relevant Medications   DULoxetine (CYMBALTA) 20 MG capsule       Meds ordered this encounter  Medications   zolpidem (AMBIEN CR) 12.5 MG CR tablet    Sig: Take 1 tablet (12.5 mg total) by mouth at  bedtime as needed for sleep.    Dispense:  30 tablet    Refill:  0   DULoxetine (CYMBALTA) 20 MG capsule    Sig: Take 1 capsule (20 mg total) by mouth 2 (two) times daily.    Dispense:  180 capsule    Refill:  1   celecoxib (CELEBREX) 100 MG capsule    Sig: Take 1 capsule (100 mg total) by mouth 2 (two) times daily.    Dispense:  60 capsule    Refill:  3    Follow-up: Return in about 4 months (around 05/16/2023) for Insomnia and knee OA.    Anabel Halon, MD

## 2023-01-13 NOTE — Assessment & Plan Note (Addendum)
S/p C3-C4 decompression with fusion surgery Has persistent neck pain On gabapentin, Cymbalta and Robaxin Increased dose of Cymbalta to 20 mg BID Followed by Spine Surgery

## 2023-01-13 NOTE — Assessment & Plan Note (Addendum)
S/p lumbar spine surgery Has sciatica pain, referral to Spine surgery was provided in the past Continue Robaxin for muscle spasms On gabapentin 600 mg QAM and at lunch, and 900 mg nightly

## 2023-01-13 NOTE — Assessment & Plan Note (Signed)
Uses Albuterol inhaler PRN, uses it occasionally Continue Flovent inhaler - advised to rinse mouth after use for proper oral care

## 2023-01-23 ENCOUNTER — Other Ambulatory Visit: Payer: Self-pay | Admitting: Internal Medicine

## 2023-01-23 DIAGNOSIS — M4800 Spinal stenosis, site unspecified: Secondary | ICD-10-CM

## 2023-01-23 DIAGNOSIS — G8929 Other chronic pain: Secondary | ICD-10-CM

## 2023-03-01 ENCOUNTER — Other Ambulatory Visit: Payer: Self-pay | Admitting: Surgical

## 2023-03-01 DIAGNOSIS — M180 Bilateral primary osteoarthritis of first carpometacarpal joints: Secondary | ICD-10-CM

## 2023-03-04 ENCOUNTER — Other Ambulatory Visit: Payer: Self-pay | Admitting: Internal Medicine

## 2023-03-04 DIAGNOSIS — F5101 Primary insomnia: Secondary | ICD-10-CM

## 2023-03-23 ENCOUNTER — Other Ambulatory Visit: Payer: Self-pay | Admitting: Internal Medicine

## 2023-03-23 DIAGNOSIS — Z9889 Other specified postprocedural states: Secondary | ICD-10-CM

## 2023-03-23 DIAGNOSIS — M4722 Other spondylosis with radiculopathy, cervical region: Secondary | ICD-10-CM

## 2023-04-24 ENCOUNTER — Other Ambulatory Visit: Payer: Self-pay | Admitting: Internal Medicine

## 2023-04-24 DIAGNOSIS — G8929 Other chronic pain: Secondary | ICD-10-CM

## 2023-04-24 DIAGNOSIS — M170 Bilateral primary osteoarthritis of knee: Secondary | ICD-10-CM

## 2023-04-24 DIAGNOSIS — M4800 Spinal stenosis, site unspecified: Secondary | ICD-10-CM

## 2023-05-19 ENCOUNTER — Ambulatory Visit (INDEPENDENT_AMBULATORY_CARE_PROVIDER_SITE_OTHER): Payer: Medicare Other | Admitting: Internal Medicine

## 2023-05-19 ENCOUNTER — Encounter: Payer: Self-pay | Admitting: Internal Medicine

## 2023-05-19 VITALS — BP 127/76 | HR 65 | Ht 65.0 in | Wt 148.6 lb

## 2023-05-19 DIAGNOSIS — F5104 Psychophysiologic insomnia: Secondary | ICD-10-CM

## 2023-05-19 DIAGNOSIS — F4310 Post-traumatic stress disorder, unspecified: Secondary | ICD-10-CM | POA: Diagnosis not present

## 2023-05-19 DIAGNOSIS — Z780 Asymptomatic menopausal state: Secondary | ICD-10-CM

## 2023-05-19 DIAGNOSIS — J4520 Mild intermittent asthma, uncomplicated: Secondary | ICD-10-CM

## 2023-05-19 DIAGNOSIS — M4722 Other spondylosis with radiculopathy, cervical region: Secondary | ICD-10-CM

## 2023-05-19 DIAGNOSIS — J309 Allergic rhinitis, unspecified: Secondary | ICD-10-CM | POA: Insufficient documentation

## 2023-05-19 DIAGNOSIS — Z13820 Encounter for screening for osteoporosis: Secondary | ICD-10-CM | POA: Diagnosis not present

## 2023-05-19 DIAGNOSIS — Z23 Encounter for immunization: Secondary | ICD-10-CM | POA: Diagnosis not present

## 2023-05-19 DIAGNOSIS — R5382 Chronic fatigue, unspecified: Secondary | ICD-10-CM

## 2023-05-19 NOTE — Assessment & Plan Note (Addendum)
Well-controlled with Ambien CR 12.5 mg at bedtime Was uncontrolled with Ambien 10 mg at bedtime PDMP reviewed, refilled Ambien

## 2023-05-19 NOTE — Assessment & Plan Note (Signed)
DEXA scan ordered after discussing with the patient.

## 2023-05-19 NOTE — Patient Instructions (Addendum)
Please continue to take medications as prescribed.  Please continue to follow heart healthy diet and perform moderate exercise/walking as tolerated.  Please consider getting Shingrix and Tdap vaccine at local pharmacy.

## 2023-05-19 NOTE — Progress Notes (Addendum)
Established Patient Office Visit  Subjective:  Patient ID: Stephanie Noble, female    DOB: 08-13-1955  Age: 67 y.o. MRN: [id number]  CC:  Chief Complaint  Patient presents with   Fatigue    Four month follow up    Nasal Congestion    HPI Stephanie Noble is a 67 y.o. 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  Patient has a longstanding history of insomnia, for which she has been taking Ambien 12.5 mg qHS.  She has noticed improvement in insomnia and is able to maintain sleep for up to 6 hours.  She denies any anhedonia, recent weight or appetite changes or suicidal ideation.  Patient has been having chronic neck pain, which is constant, variable in intensity, worse with movement and is associated with numbness and weakness in bilateral upper extremities.  Patient has had fusion surgery in 2013, 2014 and 2023. Patient also complains of lower back pain, which refers to the buttocks area.  Patient takes Robaxin for muscle spasms.  Patient takes gabapentin and Cymbalta for neuropathy pain.  Patient used to see pain specialist in the past.  She also complains of chronic knee pain and swelling, for which she takes Celebrex as needed with some relief. She sees Dr August Saucer for OA of knee and had left TKA in 04/24. She has Oxycodone 5 mg PRN for severe pain.  She reports nasal congestion and thick nasal drainage for the last 3 days.  Denies any fever, chills, dyspnea or wheezing currently.  She uses albuterol as needed for asthma.    Past Medical History:  Diagnosis Date   Anxiety    Asthma    uses albuterol as needed "mostly weather induced".ALbuterol and Ventolin inhaler as needed   Chronic back pain    Chronic neck pain    Complication of anesthesia    post-op PEA arrest 04/16/16, thought due to  respriatory arrest from sedation, narcotic, hypovolemia   Degenerative disc disease    cervical and lumbar area   Depression    History of bronchitis 2016   History of cervical spinal surgery 12/30/2019   History of lumbar surgery 12/30/2019   Insomnia    takes Ambien nightly   Muscle spasm    takes Robaxin daily    Pneumonia    hx of-2016   PONV (postoperative nausea and vomiting)    Weakness    numbness and tingling in legs    Past Surgical History:  Procedure Laterality Date   ANTERIOR CERVICAL DECOMP/DISCECTOMY FUSION N/A 09/05/2021   Procedure: CERVICAL THREE-FOUR ANTERIOR CERVICAL DECOMPRESSION/DISCECTOMY FUSION WITH PARTIAL PLATE REMOVAL AT CERVICAL FOUR-FIVE;  Surgeon: Bethann Goo, DO;  Location: [facility] OR;  Service: Neurosurgery;  Laterality: N/A;  3C   BACK SURGERY     x 3 lumbar   CHOLECYSTECTOMY     DILATION AND CURETTAGE OF UTERUS     x 2   JOINT REPLACEMENT     KNEE SURGERY Left    x 2   left knee arthroscopy      x 2   POSTERIOR CERVICAL FUSION/FORAMINOTOMY  05/17/2012   Procedure: POSTERIOR CERVICAL FUSION/FORAMINOTOMY LEVEL 2;  Surgeon: Hewitt Shorts, MD;  Location: [facility];  Service: Neurosurgery;  Laterality: N/A;  cervical six to Thoracic One posterior cervical arthrodesis with instrumentation  POSTERIOR CERVICAL FUSION/FORAMINOTOMY N/A 06/17/2022   Procedure: CERVICAL TWO-THREE, CERVICAL THREE-FOUR, CERVICAL FOUR-FIVE POSTERIOR CERVICAL INSTRUMENTATION AND FUSION WITH EXTENSION TO CERVICAL SIX-THORACIC ONE FUSION;  Surgeon: Dawley, Alan Mulder, DO;  Location: [facility] OR;  Service: Neurosurgery;  Laterality: N/A;   SHOULDER ARTHROSCOPY Left 10/27/2019   Procedure: left shoulder arthroscopy with arthroscopic vs open distal clavical excision;  Surgeon: Cammy Copa, MD;  Location: [facility] OR;  Service: Orthopedics;  Laterality: Left;   SHOULDER SURGERY     right   SPINE SURGERY     x 5   TOE FUSION Left 09/2016   TOTAL HIP ARTHROPLASTY Left 11/09/2018    Procedure: LEFT TOTAL HIP ARTHROPLASTY DIRECT ANTERIOR APPROACH;  Surgeon: Cammy Copa, MD;  Location: [facility] ORS;  Service: Orthopedics;  Laterality: Left;   TOTAL KNEE ARTHROPLASTY Left 10/07/2022   Procedure: LEFT TOTAL KNEE ARTHROPLASTY;  Surgeon: Cammy Copa, MD;  Location: [facility] OR;  Service: Orthopedics;  Laterality: Left;    Family History  Problem Relation Age of Onset   Dementia Mother    Other Sister    Breast cancer Sister    Alcohol abuse Other    Breast cancer Paternal Aunt     Social History   Socioeconomic History   Marital status: Married    Spouse name: Not on file   Number of children: Not on file   Years of education: Not on file   Highest education level: Not on file  Occupational History   Not on file  Tobacco Use   Smoking status: Never   Smokeless tobacco: Never  Vaping Use   Vaping status: Never Used  Substance and Sexual Activity   Alcohol use: No   Drug use: No   Sexual activity: Yes    Birth control/protection: Post-menopausal  Other Topics Concern   Not on file  Social History Narrative   Not on file   Social Drivers of Health   Financial Resource Strain: Not on file  Food Insecurity: Low Risk  (12/30/2022)   Received from [facility]   Hunger Vital Sign    Worried About Running Out of Food in the Last Year: Never true    Ran Out of Food in the Last Year: Never true  Transportation Needs: Not on file (12/30/2022)  Physical Activity: Not on file  Stress: No Stress Concern Present (07/13/2019)   Received from [facility], [facility]   [employer] of [facility]ational [facility] - Occupational Stress Questionnaire    Feeling of Stress : Not at all  Social Connections: Not on file  Intimate Partner Violence: Not At Risk (03/05/2023)   Received from [facility]   Humiliation, Afraid, Rape, and Kick questionnaire    Fear of Current or Ex-Partner: No    Emotionally Abused: No    Physically Abused: No    Sexually Abused: No     Outpatient Medications Prior to Visit  Medication Sig Dispense Refill   albuterol (PROVENTIL) (2.5 MG/3ML) 0.083% nebulizer solution Take 3 mLs (2.5 mg total) by nebulization every 4 (four) hours as needed for wheezing or shortness of breath. 150 mL 5   albuterol (VENTOLIN HFA) 108 (90 Base) MCG/ACT inhaler Inhale 2 sprays QID prn wheezing (Patient taking differently: Inhale 2 puffs into the lungs 4 (four) times daily as needed for wheezing or shortness of breath.) 18 g 0   aspirin 81 MG chewable tablet Chew 1 tablet (81 mg total) by mouth 2 (two) times daily. 60 tablet  0   celecoxib (CELEBREX) 100 MG capsule TAKE 1 CAPSULE BY MOUTH TWICE A DAY 60 capsule 3   docusate sodium (COLACE) 100 MG capsule Take 1 capsule (100 mg total) by mouth 2 (two) times daily. 10 capsule 0   fluticasone (FLONASE) 50 MCG/ACT nasal spray Place 1 spray into both nostrils daily. 15.8 mL 6   folic acid (FOLVITE) 1 MG tablet Take 1 tablet (1 mg total) by mouth daily. 90 tablet 3   mupirocin ointment (BACTROBAN) 2 % Apply 1 Application topically 2 (two) times daily as needed (wound care).     Omega-3 Fatty Acids (FISH OIL) 1200 MG CAPS Take 1,200 mg by mouth 2 (two) times a week.     oxyCODONE (OXY IR/ROXICODONE) 5 MG immediate release tablet Take 1 tablet (5 mg total) by mouth every 6 (six) hours as needed for severe pain. 30 tablet 0   Propylene Glycol (SYSTANE BALANCE) 0.6 % SOLN Place 1 drop into both eyes 2 (two) times daily as needed (dry eyes).     DULoxetine (CYMBALTA) 20 MG capsule Take 1 capsule (20 mg total) by mouth 2 (two) times daily. 180 capsule 1   gabapentin (NEURONTIN) 300 MG capsule TAKE 2 CAPSULES BY MOUTH IN THE MORNING, 2 CAPSULES AT LUNCH, AND 3 CAPSULES AT BEDTIME 210 capsule 2   methocarbamol (ROBAXIN) 750 MG tablet TAKE 1 TABLET BY MOUTH THREE TIMES A DAY AS NEEDED 90 tablet 2   zolpidem (AMBIEN CR) 12.5 MG CR tablet TAKE 1 TABLET BY MOUTH AT BEDTIME AS NEEDED FOR SLEEP. 30 tablet 3    Facility-Administered Medications Prior to Visit  Medication Dose Route Frequency Provider Last Rate Last Admin   lidocaine (PF) (XYLOCAINE) 1 % injection 1 mL  1 mL Intradermal Once Gardenia Phlegm, MD        Allergies  Allergen Reactions   Vistaril [Hydroxyzine Hcl] Other (See Comments)    Pt went into cardiac arrest    Hydromorphone Hcl Hives    Per report from PACU RN, pt had itching on arrival to pacu then developed hives after dilaudid given. Discussed with Dr. Jake Samples and dilaudid allergy being added to record (09/06/21)   Dilaudid [Hydromorphone] Rash    ROS Review of Systems  Constitutional:  Negative for chills and fever.  HENT:  Positive for congestion. Negative for sinus pressure, sinus pain and sore throat.   Eyes:  Negative for pain and discharge.  Respiratory:  Negative for cough and shortness of breath.   Cardiovascular:  Negative for chest pain and palpitations.  Gastrointestinal:  Negative for abdominal pain, constipation, diarrhea, nausea and vomiting.  Endocrine: Negative for polydipsia and polyuria.  Genitourinary:  Negative for dysuria and hematuria.  Musculoskeletal:  Positive for arthralgias, back pain and neck pain. Negative for neck stiffness.       B/l hand pain Knee pain  Skin:  Negative for rash.  Neurological:  Positive for weakness (B/l UE) and numbness (B/l UE). Negative for dizziness and headaches.  Psychiatric/Behavioral:  Negative for agitation and behavioral problems.       Objective:    Physical Exam Vitals reviewed.  Constitutional:      General: She is not in acute distress.    Appearance: She is not diaphoretic.  HENT:     Head: Normocephalic and atraumatic.     Nose: Congestion present.     Mouth/Throat:     Mouth: Mucous membranes are dry. Oral lesions present.  Eyes:     General:  No scleral icterus.    Extraocular Movements: Extraocular movements intact.     Pupils: Pupils are equal, round, and reactive to light.  Neck:      Comments: Well-healed scar in the cervical (neck) area Cardiovascular:     Rate and Rhythm: Normal rate and regular rhythm.     Pulses: Normal pulses.     Heart sounds: Normal heart sounds. No murmur heard.    No friction rub. No gallop.  Pulmonary:     Breath sounds: Normal breath sounds. No wheezing or rales.  Musculoskeletal:     Cervical back: Neck supple. Tenderness present.     Right lower leg: No edema.     Left lower leg: No edema.  Skin:    General: Skin is warm.     Findings: Erythema (Over nose) present. No rash.  Neurological:     General: No focal deficit present.     Mental Status: She is alert and oriented to person, place, and time.     Sensory: No sensory deficit.     Motor: No weakness.  Psychiatric:        Mood and Affect: Mood normal.        Behavior: Behavior normal.     BP 127/76 (BP Location: Right Arm, Patient Position: Sitting, Cuff Size: Normal)   Pulse 65   Ht 5\' 5"  (1.651 m)   Wt 148 lb 9.6 oz (67.4 kg)   SpO2 96%   BMI 24.73 kg/m  Wt Readings from Last 3 Encounters:  05/19/23 148 lb 9.6 oz (67.4 kg)  01/13/23 146 lb (66.2 kg)  12/29/22 145 lb 0.6 oz (65.8 kg)    Lab Results  Component Value Date   TSH 4.000 06/26/2017   Lab Results  Component Value Date   WBC 3.4 12/02/2022   HGB 13.7 12/02/2022   HCT 43.5 12/02/2022   MCV 95 12/02/2022   PLT 250 12/02/2022   Lab Results  Component Value Date   NA 139 09/25/2022   K 4.3 09/25/2022   CO2 29 09/25/2022   GLUCOSE 82 09/25/2022   BUN 13 09/25/2022   CREATININE 0.83 09/25/2022   BILITOT 0.5 04/17/2016   ALKPHOS 39 04/17/2016   AST 87 (H) 04/17/2016   ALT 71 (H) 04/17/2016   PROT 5.6 (L) 04/17/2016   ALBUMIN 3.2 (L) 04/17/2016   CALCIUM 9.6 09/25/2022   ANIONGAP 10 09/25/2022   No results found for: "CHOL" No results found for: "HDL" No results found for: "LDLCALC" No results found for: "TRIG" No results found for: "CHOLHDL" No results found for: "HGBA1C"    Assessment  & Plan:   Problem List Items Addressed This Visit       Respiratory   Asthma   Uses Albuterol inhaler PRN, uses it occasionally Used to use Flovent inhaler as maintenance inhaler      Allergic rhinitis   Advised to use Flonase Continue Zyrtec or Xyzal for allergies        Nervous and Auditory   Cervical spondylosis with radiculopathy   S/p C3-C4 decompression with fusion surgery Has persistent neck pain On gabapentin, Cymbalta and Robaxin Increased dose of Cymbalta to 20 mg BID in the last visit Followed by Spine Surgery        Other   Insomnia - Primary   Well-controlled with Ambien CR 12.5 mg at bedtime Was uncontrolled with Ambien 10 mg at bedtime PDMP reviewed, refilled Ambien      PTSD (post-traumatic stress disorder)  Stable Has insomnia, takes Ambien      Fatigue   Chronic fatigue, recently improved Chronic neck, low back and knee pain also contributing to fatigue On Cymbalta 20 mg BID currently for neuropathy pain      Osteoporosis screening   DEXA scan ordered after discussing with the patient.      Relevant Orders   DG Bone Density (Completed)   Other Visit Diagnoses       Postmenopausal       Relevant Orders   DG Bone Density (Completed)     Encounter for immunization       Relevant Orders   Pneumococcal conjugate vaccine 20-valent (Completed)        No orders of the defined types were placed in this encounter.   Follow-up: Return in about 6 months (around 11/16/2023) for OA and insomnia.    Anabel Halon, MD

## 2023-05-19 NOTE — Assessment & Plan Note (Signed)
Stable Has insomnia, takes Ambien 

## 2023-05-19 NOTE — Assessment & Plan Note (Signed)
Uses Albuterol inhaler PRN, uses it occasionally Used to use Flovent inhaler as maintenance inhaler

## 2023-05-19 NOTE — Assessment & Plan Note (Signed)
Advised to use Flonase Continue Zyrtec or Xyzal for allergies

## 2023-05-19 NOTE — Assessment & Plan Note (Signed)
S/p C3-C4 decompression with fusion surgery Has persistent neck pain On gabapentin, Cymbalta and Robaxin Increased dose of Cymbalta to 20 mg BID in the last visit Followed by Spine Surgery

## 2023-05-19 NOTE — Assessment & Plan Note (Addendum)
Chronic fatigue, recently improved Chronic neck, low back and knee pain also contributing to fatigue On Cymbalta 20 mg BID currently for neuropathy pain

## 2023-05-20 ENCOUNTER — Ambulatory Visit: Payer: Self-pay | Admitting: Internal Medicine

## 2023-05-25 ENCOUNTER — Ambulatory Visit (HOSPITAL_COMMUNITY)
Admission: RE | Admit: 2023-05-25 | Discharge: 2023-05-25 | Disposition: A | Discharge status: Home / Self Care | Payer: Medicare Other | Source: Ambulatory Visit | Attending: Internal Medicine | Admitting: Internal Medicine

## 2023-05-25 DIAGNOSIS — Z780 Asymptomatic menopausal state: Secondary | ICD-10-CM | POA: Insufficient documentation

## 2023-05-25 DIAGNOSIS — Z13820 Encounter for screening for osteoporosis: Secondary | ICD-10-CM | POA: Insufficient documentation

## 2023-06-18 ENCOUNTER — Other Ambulatory Visit: Payer: Self-pay | Admitting: Internal Medicine

## 2023-06-18 DIAGNOSIS — M4722 Other spondylosis with radiculopathy, cervical region: Secondary | ICD-10-CM

## 2023-06-18 DIAGNOSIS — Z9889 Other specified postprocedural states: Secondary | ICD-10-CM

## 2023-07-19 ENCOUNTER — Other Ambulatory Visit: Payer: Self-pay | Admitting: Internal Medicine

## 2023-07-19 DIAGNOSIS — M4722 Other spondylosis with radiculopathy, cervical region: Secondary | ICD-10-CM

## 2023-07-19 DIAGNOSIS — Z9889 Other specified postprocedural states: Secondary | ICD-10-CM

## 2023-07-19 DIAGNOSIS — F5101 Primary insomnia: Secondary | ICD-10-CM

## 2023-08-07 ENCOUNTER — Other Ambulatory Visit: Payer: Self-pay | Admitting: Internal Medicine

## 2023-08-07 DIAGNOSIS — G8929 Other chronic pain: Secondary | ICD-10-CM

## 2023-08-07 DIAGNOSIS — M4722 Other spondylosis with radiculopathy, cervical region: Secondary | ICD-10-CM

## 2023-08-07 DIAGNOSIS — M4800 Spinal stenosis, site unspecified: Secondary | ICD-10-CM

## 2023-08-07 DIAGNOSIS — Z9889 Other specified postprocedural states: Secondary | ICD-10-CM

## 2023-08-11 ENCOUNTER — Encounter: Payer: Self-pay | Admitting: Internal Medicine

## 2023-08-19 ENCOUNTER — Other Ambulatory Visit: Payer: Self-pay | Admitting: Internal Medicine

## 2023-08-19 DIAGNOSIS — F5104 Psychophysiologic insomnia: Secondary | ICD-10-CM

## 2023-08-19 MED ORDER — ZOLPIDEM TARTRATE 10 MG PO TABS
10.0000 mg | ORAL_TABLET | Freq: Every evening | ORAL | 4 refills | Status: DC | PRN
Start: 2023-08-19 — End: 2024-01-04

## 2023-08-19 NOTE — Telephone Encounter (Signed)
Insurance not covering zolpidem 12.5mg . Wants it reduced back to the 10mg  for coverage. Please advise

## 2023-08-19 NOTE — Telephone Encounter (Unsigned)
Copied from CRM [id number]. Topic: Clinical - Prescription Issue >> Aug 19, 2023 10:28 AM Fonda Kinder J wrote: Reason for CRM: Pts husband wants to have the pt's medication,  Zolpidem 12.5mg  be switched back to 10 mg so that her insurance can cover it

## 2023-09-08 ENCOUNTER — Other Ambulatory Visit: Payer: Self-pay | Admitting: Internal Medicine

## 2023-09-08 DIAGNOSIS — M170 Bilateral primary osteoarthritis of knee: Secondary | ICD-10-CM

## 2023-10-10 ENCOUNTER — Other Ambulatory Visit: Payer: Self-pay | Admitting: Internal Medicine

## 2023-10-10 DIAGNOSIS — M4800 Spinal stenosis, site unspecified: Secondary | ICD-10-CM

## 2023-10-10 DIAGNOSIS — G8929 Other chronic pain: Secondary | ICD-10-CM

## 2023-10-17 ENCOUNTER — Other Ambulatory Visit: Payer: Self-pay | Admitting: Internal Medicine

## 2023-10-17 DIAGNOSIS — Z9889 Other specified postprocedural states: Secondary | ICD-10-CM

## 2023-10-17 DIAGNOSIS — M4722 Other spondylosis with radiculopathy, cervical region: Secondary | ICD-10-CM

## 2023-10-28 ENCOUNTER — Ambulatory Visit (INDEPENDENT_AMBULATORY_CARE_PROVIDER_SITE_OTHER): Payer: Self-pay

## 2023-10-28 VITALS — BP 119/80 | HR 72 | Ht 65.0 in | Wt 149.0 lb

## 2023-10-28 DIAGNOSIS — W57XXXA Bitten or stung by nonvenomous insect and other nonvenomous arthropods, initial encounter: Secondary | ICD-10-CM | POA: Diagnosis not present

## 2023-10-28 DIAGNOSIS — R5383 Other fatigue: Secondary | ICD-10-CM | POA: Diagnosis not present

## 2023-10-28 MED ORDER — DOXYCYCLINE HYCLATE 100 MG PO TABS
100.0000 mg | ORAL_TABLET | Freq: Two times a day (BID) | ORAL | 0 refills | Status: AC
Start: 2023-10-28 — End: 2023-11-02

## 2023-10-28 MED ORDER — MUPIROCIN 2 % EX OINT: 1.0000 | TOPICAL_OINTMENT | Freq: Two times a day (BID) | CUTANEOUS | 2 refills | Status: DC

## 2023-10-28 NOTE — Progress Notes (Signed)
 Acute Office Visit  Subjective:     Patient ID: Stephanie Noble, female    DOB: 20-Oct-1955, 68 y.o.   MRN: [id number]  Chief Complaint  Patient presents with   Medical Management of Chronic Issues    Pt states "Fatigue and headaches, been diagnosed with Mono and has had pulled almost a dozen ticks off her and don't know if its a mono flare up or because if the ticks"    HPI Patient is in today for Fatigue: Patient complains of fatigue. Symptoms began several weeks ago. Sentinal symptom the patient feels fatigue began with: symptoms of arthritis and headaches . Symptoms of her fatigue have been general malaise, headaches, and lack of interest in usual activities. Patient describes the following psychologic symptoms: none.  Patient denies fever, unusual rashes, and witnessed or suspected sleep apnea. Symptoms have gradually worsened. Severity has been struggles to carry out day to day responsibilities.. Previous visits for this problem: none.    ROS      Objective:    BP 119/80   Pulse 72   Ht 5\' 5"  (1.651 m)   Wt 149 lb 0.6 oz (67.6 kg)   SpO2 96%   BMI 24.80 kg/m  BP Readings from Last 3 Encounters:  10/28/23 119/80  05/19/23 127/76  01/13/23 100/62   Wt Readings from Last 3 Encounters:  10/28/23 149 lb 0.6 oz (67.6 kg)  05/19/23 148 lb 9.6 oz (67.4 kg)  01/13/23 146 lb (66.2 kg)      Physical Exam Vitals and nursing note reviewed.  Constitutional:      Appearance: Normal appearance.  HENT:     Head: Normocephalic.     Right Ear: Tympanic membrane, ear canal and external ear normal.     Left Ear: Tympanic membrane, ear canal and external ear normal.     Nose: Nose normal.     Mouth/Throat:     Mouth: Mucous membranes are moist.     Pharynx: Oropharynx is clear.  Eyes:     Extraocular Movements: Extraocular movements intact.     Pupils: Pupils are equal, round, and reactive to light.  Cardiovascular:     Rate and Rhythm: Normal rate and regular rhythm.   Pulmonary:     Effort: Pulmonary effort is normal.     Breath sounds: Normal breath sounds.  Musculoskeletal:     Cervical back: Normal range of motion and neck supple.  Skin:    General: Skin is warm and dry.  Neurological:     Mental Status: She is alert and oriented to person, place, and time.  Psychiatric:        Mood and Affect: Mood normal.        Thought Content: Thought content normal.     No results found for any visits on 10/28/23.      Assessment & Plan:   Problem List Items Addressed This Visit       Other   Fatigue - Primary   Relevant Orders   B. burgdorfi antibodies   Parvovirus B19 IgM   Lyme Disease Serology w/Reflex   Other Visit Diagnoses       Tick bite, unspecified site, initial encounter       add doxycycline  for prophylactic treatment of known multiple tick bites.   Relevant Medications   doxycycline  (VIBRA -TABS) 100 MG tablet   Other Relevant Orders   Lyme Disease Serology w/Reflex       Meds ordered this encounter  Medications  doxycycline  (VIBRA -TABS) 100 MG tablet    Sig: Take 1 tablet (100 mg total) by mouth 2 (two) times daily for 5 days.    Dispense:  10 tablet    Refill:  0   mupirocin  ointment (BACTROBAN ) 2 %    Sig: Apply 1 Application topically 2 (two) times daily.    Dispense:  15 g    Refill:  2    No follow-ups on file.  Alison Irvine, FNP

## 2023-10-30 LAB — PARVOVIRUS B19 IGM: Parvovirus B19 IgM: 0.1 {index} (ref 0.0–0.8)

## 2023-10-30 LAB — LYME DISEASE SEROLOGY W/REFLEX: Lyme Total Antibody EIA: NEGATIVE

## 2023-11-03 ENCOUNTER — Other Ambulatory Visit: Payer: Self-pay

## 2023-11-03 DIAGNOSIS — R5383 Other fatigue: Secondary | ICD-10-CM

## 2023-11-04 NOTE — Telephone Encounter (Signed)
Noted, will let pt know.

## 2023-11-04 NOTE — Telephone Encounter (Signed)
 Pt advised with verbal understanding

## 2023-11-07 LAB — CBC WITH DIFFERENTIAL/PLATELET
Basophils Absolute: 0 10*3/uL (ref 0.0–0.2)
Basos: 1 %
EOS (ABSOLUTE): 0.1 10*3/uL (ref 0.0–0.4)
Eos: 3 %
Hematocrit: 41.4 % (ref 34.0–46.6)
Hemoglobin: 13.8 g/dL (ref 11.1–15.9)
Immature Grans (Abs): 0 10*3/uL (ref 0.0–0.1)
Immature Granulocytes: 0 %
Lymphocytes Absolute: 1.1 10*3/uL (ref 0.7–3.1)
Lymphs: 32 %
MCH: 31.3 pg (ref 26.6–33.0)
MCHC: 33.3 g/dL (ref 31.5–35.7)
MCV: 94 fL (ref 79–97)
Monocytes Absolute: 0.3 10*3/uL (ref 0.1–0.9)
Monocytes: 10 %
Neutrophils Absolute: 1.8 10*3/uL (ref 1.4–7.0)
Neutrophils: 54 %
Platelets: 222 10*3/uL (ref 150–450)
RBC: 4.41 x10E6/uL (ref 3.77–5.28)
RDW: 12 % (ref 11.7–15.4)
WBC: 3.4 10*3/uL (ref 3.4–10.8)

## 2023-11-13 ENCOUNTER — Other Ambulatory Visit: Payer: Self-pay

## 2023-11-13 DIAGNOSIS — R5383 Other fatigue: Secondary | ICD-10-CM

## 2023-11-25 ENCOUNTER — Ambulatory Visit: Payer: Self-pay

## 2023-11-26 ENCOUNTER — Ambulatory Visit: Payer: Self-pay

## 2023-11-26 ENCOUNTER — Encounter: Payer: Self-pay | Admitting: Internal Medicine

## 2023-11-26 ENCOUNTER — Ambulatory Visit (INDEPENDENT_AMBULATORY_CARE_PROVIDER_SITE_OTHER): Admitting: Internal Medicine

## 2023-11-26 VITALS — BP 106/68 | HR 69 | Ht 65.0 in | Wt 149.2 lb

## 2023-11-26 DIAGNOSIS — M4722 Other spondylosis with radiculopathy, cervical region: Secondary | ICD-10-CM | POA: Diagnosis not present

## 2023-11-26 DIAGNOSIS — W260XXA Contact with knife, initial encounter: Secondary | ICD-10-CM

## 2023-11-26 DIAGNOSIS — Z9889 Other specified postprocedural states: Secondary | ICD-10-CM

## 2023-11-26 DIAGNOSIS — S61211A Laceration without foreign body of left index finger without damage to nail, initial encounter: Secondary | ICD-10-CM

## 2023-11-26 DIAGNOSIS — G5603 Carpal tunnel syndrome, bilateral upper limbs: Secondary | ICD-10-CM

## 2023-11-26 DIAGNOSIS — Z23 Encounter for immunization: Secondary | ICD-10-CM | POA: Diagnosis not present

## 2023-11-26 DIAGNOSIS — F4310 Post-traumatic stress disorder, unspecified: Secondary | ICD-10-CM

## 2023-11-26 DIAGNOSIS — Z8619 Personal history of other infectious and parasitic diseases: Secondary | ICD-10-CM

## 2023-11-26 DIAGNOSIS — R5382 Chronic fatigue, unspecified: Secondary | ICD-10-CM

## 2023-11-26 MED ORDER — DULOXETINE HCL 60 MG PO CPEP: 60.0000 mg | ORAL_CAPSULE | Freq: Every day | ORAL | 1 refills | Status: DC

## 2023-11-26 NOTE — Patient Instructions (Addendum)
 Please start taking Cymbalta  60 mg once daily.  Please continue to take medications as prescribed.  Please continue to follow low salt diet and perform moderate exercise/walking as tolerated.

## 2023-11-26 NOTE — Assessment & Plan Note (Addendum)
 S/p C3-C4 decompression with fusion surgery Has persistent neck pain, recent worsening On gabapentin , Cymbalta  and Robaxin  Increased dose of Cymbalta  to 60 mg once daily Continue Celebrex  100 mg twice daily as needed Followed by Spine Surgery

## 2023-11-26 NOTE — Telephone Encounter (Signed)
 Copied from CRM [id number]. Topic: Clinical - Red Word Triage >> Nov 26, 2023 10:46 AM Oddis Bench wrote: Red Word that prompted transfer to Nurse Triage: Patient is stating that she is waking up from tingling and pain in her hands.   Chief Complaint: Pain and tingling  Symptoms: bilateral hand and wrist pain Frequency: constant Pertinent Negatives: Patient denies swelling Disposition: [] ED /[] Urgent Care (no appt availability in office) / [x] Appointment(In office/virtual)/ []  [facility]/ [] Home Care/ [] Refused Recommended Disposition /[] [facility]/ []  Follow-up with PCP Additional Notes: Pain and tingling in both hands x several months, getting worse. States that the pain was moreso at during the day but now she is waking up out of sleep with this pain.   Reason for Disposition  [1] Weakness or numbness in hand or fingers AND [2] present > 2 weeks  Answer Assessment - Initial Assessment Questions 1. ONSET: "When did the pain start?"     Has been going on for months  2. LOCATION: "Where is the pain located?"     Both hands and wrists, radiates to elbow sometimes.   3. PAIN: "How bad is the pain?" (Scale 1-10; or mild, moderate, severe)   - MILD (1-3): doesn't interfere with normal activities   - MODERATE (4-7): interferes with normal activities (e.g., work or school) or awakens from sleep   - SEVERE (8-10): excruciating pain, unable to use hand at all     Moderate to severe  4. WORK OR EXERCISE: "Has there been any recent work or exercise that involved this part (i.e., hand or wrist) of the body?"     No  5. CAUSE: "What do you think is causing the pain?"     Unsure of exact cause, but has history neck surgeries and sever arthritis  6. AGGRAVATING FACTORS: "What makes the pain worse?" (e.g., using computer)     No  7. OTHER SYMPTOMS: "Do you have any other symptoms?" (e.g., neck pain, swelling, rash, numbness, fever)     No  8. PREGNANCY: "Is there any  chance you are pregnant?" "When was your last menstrual period?"     no  Protocols used: Hand and Wrist Pain-A-AH
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
## 2023-11-26 NOTE — Assessment & Plan Note (Signed)
 She had EBV IgG positive in 2024, likely COVID infection or exposure Check EBV IgM due to her persistent concern for recurrent infectious mononucleosis

## 2023-11-26 NOTE — Assessment & Plan Note (Signed)
 Bilateral hand numbness and tingling with wrist pain Concern for bilateral carpal tunnel syndrome She is already on gabapentin  and Cymbalta  Referred to hand surgery

## 2023-11-26 NOTE — Assessment & Plan Note (Signed)
Stable Has insomnia, takes Ambien 
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
## 2023-11-26 NOTE — Assessment & Plan Note (Addendum)
 Chronic fatigue, recently worse Chronic neck, low back and knee pain also contributing to fatigue On Cymbalta  40 mg QD currently for neuropathy pain, increased dose to 60 mg QD

## 2023-11-27 ENCOUNTER — Ambulatory Visit: Payer: Self-pay | Admitting: Internal Medicine

## 2023-11-28 LAB — CMP14+EGFR
ALT: 18 IU/L (ref 0–32)
AST: 36 IU/L (ref 0–40)
Albumin: 4.3 g/dL (ref 3.9–4.9)
Alkaline Phosphatase: 84 IU/L (ref 44–121)
BUN/Creatinine Ratio: 14 (ref 12–28)
BUN: 17 mg/dL (ref 8–27)
Bilirubin Total: 0.4 mg/dL (ref 0.0–1.2)
CO2: 26 mmol/L (ref 20–29)
Calcium: 9.3 mg/dL (ref 8.7–10.3)
Chloride: 99 mmol/L (ref 96–106)
Creatinine, Ser: 1.21 mg/dL — ABNORMAL HIGH (ref 0.57–1.00)
Globulin, Total: 2.6 g/dL (ref 1.5–4.5)
Glucose: 71 mg/dL (ref 70–99)
Potassium: 4.7 mmol/L (ref 3.5–5.2)
Sodium: 139 mmol/L (ref 134–144)
Total Protein: 6.9 g/dL (ref 6.0–8.5)
eGFR: 49 mL/min/{1.73_m2} — ABNORMAL LOW (ref >59)

## 2023-11-28 LAB — CBC WITH DIFFERENTIAL/PLATELET
Basophils Absolute: 0 10*3/uL (ref 0.0–0.2)
Basos: 1 %
EOS (ABSOLUTE): 0.1 10*3/uL (ref 0.0–0.4)
Eos: 2 %
Hematocrit: 43.3 % (ref 34.0–46.6)
Hemoglobin: 14.1 g/dL (ref 11.1–15.9)
Immature Grans (Abs): 0 10*3/uL (ref 0.0–0.1)
Immature Granulocytes: 0 %
Lymphocytes Absolute: 1.3 10*3/uL (ref 0.7–3.1)
Lymphs: 25 %
MCH: 30.6 pg (ref 26.6–33.0)
MCHC: 32.6 g/dL (ref 31.5–35.7)
MCV: 94 fL (ref 79–97)
Monocytes Absolute: 0.4 10*3/uL (ref 0.1–0.9)
Monocytes: 8 %
Neutrophils Absolute: 3.2 10*3/uL (ref 1.4–7.0)
Neutrophils: 64 %
Platelets: 250 10*3/uL (ref 150–450)
RBC: 4.61 x10E6/uL (ref 3.77–5.28)
RDW: 12 % (ref 11.7–15.4)
WBC: 5 10*3/uL (ref 3.4–10.8)

## 2023-11-28 LAB — TSH+FREE T4
Free T4: 0.88 ng/dL (ref 0.82–1.77)
TSH: 2.5 u[IU]/mL (ref 0.450–4.500)

## 2023-11-28 LAB — EBV AB TO VIRAL CAPSID AG PNL, IGG+IGM
EBV VCA IgG: 161 U/mL — ABNORMAL HIGH (ref 0.0–17.9)
EBV VCA IgM: <36 U/mL (ref 0.0–35.9)

## 2023-12-03 ENCOUNTER — Encounter: Payer: Self-pay | Admitting: Orthopedic Surgery

## 2023-12-03 ENCOUNTER — Other Ambulatory Visit: Payer: Self-pay | Admitting: Orthopedic Surgery

## 2023-12-03 ENCOUNTER — Other Ambulatory Visit (INDEPENDENT_AMBULATORY_CARE_PROVIDER_SITE_OTHER): Payer: Self-pay

## 2023-12-03 ENCOUNTER — Ambulatory Visit: Admitting: Orthopedic Surgery

## 2023-12-03 DIAGNOSIS — M25531 Pain in right wrist: Secondary | ICD-10-CM

## 2023-12-03 DIAGNOSIS — M25532 Pain in left wrist: Secondary | ICD-10-CM

## 2023-12-03 DIAGNOSIS — R200 Anesthesia of skin: Secondary | ICD-10-CM | POA: Diagnosis not present

## 2023-12-03 DIAGNOSIS — R202 Paresthesia of skin: Secondary | ICD-10-CM

## 2023-12-03 MED ORDER — MELOXICAM 7.5 MG PO TABS: 7.5000 mg | ORAL_TABLET | Freq: Every day | ORAL | 0 refills | Status: DC
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
## 2023-12-04 ENCOUNTER — Encounter: Payer: Self-pay | Admitting: Internal Medicine

## 2023-12-14 ENCOUNTER — Ambulatory Visit: Admitting: Orthopedic Surgery

## 2023-12-15 ENCOUNTER — Other Ambulatory Visit: Payer: Self-pay | Admitting: Internal Medicine

## 2023-12-15 DIAGNOSIS — M170 Bilateral primary osteoarthritis of knee: Secondary | ICD-10-CM

## 2023-12-18 ENCOUNTER — Telehealth: Payer: Self-pay

## 2023-12-18 NOTE — Telephone Encounter (Signed)
 EMG is scheduled for 6/25-LVM for Dr Merlinda Starling f/u

## 2023-12-23 ENCOUNTER — Ambulatory Visit (INDEPENDENT_AMBULATORY_CARE_PROVIDER_SITE_OTHER): Admitting: Physical Medicine and Rehabilitation

## 2023-12-23 DIAGNOSIS — M25531 Pain in right wrist: Secondary | ICD-10-CM

## 2023-12-23 DIAGNOSIS — M25532 Pain in left wrist: Secondary | ICD-10-CM

## 2023-12-23 DIAGNOSIS — M79641 Pain in right hand: Secondary | ICD-10-CM

## 2023-12-23 DIAGNOSIS — R202 Paresthesia of skin: Secondary | ICD-10-CM

## 2023-12-23 DIAGNOSIS — M79642 Pain in left hand: Secondary | ICD-10-CM

## 2023-12-23 NOTE — Progress Notes (Signed)
 Pain Scale   Average Pain 5 Patient advising she is Right hand dominate Patient advising she has bilateral hand numbness/tingling         +Driver, -BT, -Dye Allergies.
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
## 2023-12-25 NOTE — Procedures (Unsigned)
 EMG & NCV Findings: Evaluation of the left median (across palm) sensory and the right median (across palm) sensory nerves showed prolonged distal peak latency (Wrist, L4.2, R4.3 ms) and prolonged distal peak latency (Palm, L2.3, R2.6 ms).  All remaining nerves (as indicated in the following tables) were within normal limits.  All left vs. right side differences were within normal limits.    All examined muscles (as indicated in the following table) showed no evidence of electrical instability.    Impression: The above electrodiagnostic study is ABNORMAL and reveals evidence of a mild bilateral median nerve entrapment at the wrist (carpal tunnel syndrome) affecting sensory components. There is no significant electrodiagnostic evidence of any other focal nerve entrapment, brachial plexopathy or cervical radiculopathy.   Recommendations: 1.  Follow-up with referring physician. 2.  Continue current management of symptoms. 3.  Continue use of resting splint at night-time and as needed during the day.  ___________________________ Stephanie Noble Board Certified, American Board of Physical Medicine and Rehabilitation    Nerve Conduction Studies Anti Sensory Summary Table   Stim Site NR Peak (ms) Norm Peak (ms) P-T Amp (V) Norm P-T Amp Site1 Site2 Delta-P (ms) Dist (cm) Vel (m/s) Norm Vel (m/s)  Left Median Acr Palm Anti Sensory (2nd Digit)  29.9C  Wrist    *4.2 <3.6 30.3 >10 Wrist Palm 1.9 0.0    Palm    *2.3 <2.0 13.0         Right Median Acr Palm Anti Sensory (2nd Digit)  29.9C  Wrist    *4.3 <3.6 31.2 >10 Wrist Palm 1.7 0.0    Palm    *2.6 <2.0 8.1         Left Radial Anti Sensory (Base 1st Digit)  29.2C  Wrist    2.4 <3.1 17.6  Wrist Base 1st Digit 2.4 0.0    Right Radial Anti Sensory (Base 1st Digit)  29.8C  Wrist    2.3 <3.1 27.2  Wrist Base 1st Digit 2.3 0.0    Left Ulnar Anti Sensory (5th Digit)  29.8C  Wrist    3.6 <3.7 20.7 >15.0 Wrist 5th Digit 3.6 14.0 39 >38  Right  Ulnar Anti Sensory (5th Digit)  30.2C  Wrist    3.6 <3.7 22.7 >15.0 Wrist 5th Digit 3.6 14.0 39 >38   Motor Summary Table   Stim Site NR Onset (ms) Norm Onset (ms) O-P Amp (mV) Norm O-P Amp Site1 Site2 Delta-0 (ms) Dist (cm) Vel (m/s) Norm Vel (m/s)  Left Median Motor (Abd Poll Brev)  29.4C  Wrist    3.8 <4.2 8.8 >5 Elbow Wrist 3.9 21.0 54 >50  Elbow    7.7  8.4         Right Median Motor (Abd Poll Brev)  29.9C  Wrist    3.8 <4.2 8.1 >5 Elbow Wrist 4.0 21.5 54 >50  Elbow    7.8  7.7         Left Ulnar Motor (Abd Dig Min)  29.4C  Wrist    3.2 <4.2 10.6 >3 B Elbow Wrist 3.2 0.0  >53  B Elbow    6.4  10.7  A Elbow B Elbow 1.5 0.0  >53  A Elbow    7.9  10.2         Right Ulnar Motor (Abd Dig Min)  29.9C  Wrist    3.0 <4.2 10.0 >3 B Elbow Wrist 3.3 19.0 58 >53  B Elbow    6.3  10.4  A Elbow B Elbow 1.2 10.0 83 >53  A Elbow    7.5  10.6          EMG   Side Muscle Nerve Root Ins Act Fibs Psw Amp Dur Poly Recrt Int Bruna Comment  Right Abd Poll Brev Median C8-T1 Nml Nml Nml Nml Nml 0 Nml Nml   Right 1stDorInt Ulnar C8-T1 Nml Nml Nml Nml Nml 0 Nml Nml   Right PronatorTeres Median C6-7 Nml Nml Nml Nml Nml 0 Nml Nml   Right Biceps Musculocut C5-6 Nml Nml Nml Nml Nml 0 Nml Nml   Right Deltoid Axillary C5-6 Nml Nml Nml Nml Nml 0 Nml Nml     Nerve Conduction Studies Anti Sensory Left/Right Comparison   Stim Site L Lat (ms) R Lat (ms) L-R Lat (ms) L Amp (V) R Amp (V) L-R Amp (%) Site1 Site2 L Vel (m/s) R Vel (m/s) L-R Vel (m/s)  Median Acr Palm Anti Sensory (2nd Digit)  29.9C  Wrist *4.2 *4.3 0.1 30.3 31.2 2.9 Wrist Palm     Palm *2.3 *2.6 0.3 13.0 8.1 37.7       Radial Anti Sensory (Base 1st Digit)  29.2C  Wrist 2.4 2.3 0.1 17.6 27.2 35.3 Wrist Base 1st Digit     Ulnar Anti Sensory (5th Digit)  29.8C  Wrist 3.6 3.6 0.0 20.7 22.7 8.8 Wrist 5th Digit 39 39 0   Motor Left/Right Comparison   Stim Site L Lat (ms) R Lat (ms) L-R Lat (ms) L Amp (mV) R Amp (mV) L-R Amp (%) Site1 Site2  L Vel (m/s) R Vel (m/s) L-R Vel (m/s)  Median Motor (Abd Poll Brev)  29.4C  Wrist 3.8 3.8 0.0 8.8 8.1 8.0 Elbow Wrist 54 54 0  Elbow 7.7 7.8 0.1 8.4 7.7 8.3       Ulnar Motor (Abd Dig Min)  29.4C  Wrist 3.2 3.0 0.2 10.6 10.0 5.7 B Elbow Wrist  58   B Elbow 6.4 6.3 0.1 10.7 10.4 2.8 A Elbow B Elbow  83   A Elbow 7.9 7.5 0.4 10.2 10.6 3.8          Waveforms:

## 2023-12-28 ENCOUNTER — Encounter: Payer: Self-pay | Admitting: Physical Medicine and Rehabilitation

## 2024-01-02 ENCOUNTER — Other Ambulatory Visit: Payer: Self-pay | Admitting: Internal Medicine

## 2024-01-02 ENCOUNTER — Other Ambulatory Visit: Payer: Self-pay

## 2024-01-02 DIAGNOSIS — F5104 Psychophysiologic insomnia: Secondary | ICD-10-CM

## 2024-01-03 ENCOUNTER — Other Ambulatory Visit: Payer: Self-pay | Admitting: Orthopedic Surgery

## 2024-01-08 ENCOUNTER — Telehealth: Payer: Self-pay | Admitting: *Deleted

## 2024-01-08 NOTE — Telephone Encounter (Signed)
 1 year Ortho bundle post op call completed.

## 2024-01-16 ENCOUNTER — Other Ambulatory Visit: Payer: Self-pay | Admitting: Internal Medicine

## 2024-01-16 DIAGNOSIS — M4800 Spinal stenosis, site unspecified: Secondary | ICD-10-CM

## 2024-01-16 DIAGNOSIS — G8929 Other chronic pain: Secondary | ICD-10-CM

## 2024-01-16 DIAGNOSIS — Z9889 Other specified postprocedural states: Secondary | ICD-10-CM

## 2024-01-16 DIAGNOSIS — M4722 Other spondylosis with radiculopathy, cervical region: Secondary | ICD-10-CM

## 2024-01-18 ENCOUNTER — Ambulatory Visit (INDEPENDENT_AMBULATORY_CARE_PROVIDER_SITE_OTHER): Admitting: Orthopedic Surgery

## 2024-01-18 DIAGNOSIS — G5603 Carpal tunnel syndrome, bilateral upper limbs: Secondary | ICD-10-CM | POA: Diagnosis not present
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
## 2024-01-19 ENCOUNTER — Other Ambulatory Visit: Payer: Self-pay | Admitting: Orthopedic Surgery

## 2024-01-24 ENCOUNTER — Encounter: Payer: Self-pay | Admitting: Orthopedic Surgery

## 2024-01-29 ENCOUNTER — Encounter: Payer: Self-pay | Admitting: Orthopedic Surgery

## 2024-01-29 ENCOUNTER — Telehealth: Payer: Self-pay

## 2024-01-29 NOTE — Telephone Encounter (Signed)
 Patient left a voicemail stating that she would like to schedule in office surgery for CT.  She is asking for a return call at [phone]

## 2024-01-29 NOTE — Telephone Encounter (Signed)
 FYI: I spoke with the patient and advised her I do not have a surgery sheet on her to schedule surgery. The last communication doctor had with her was thru My Chart and patient was discussing conservation treatment. Possible injections over surgery. Patient states she had forgotten that, and will follow up with doctor thru My Chart with further questions in regards to injections.

## 2024-02-01 NOTE — Telephone Encounter (Signed)
Called pt and made her a appt

## 2024-02-01 NOTE — Telephone Encounter (Signed)
 Do you want to see her back or ok to give apri sx sheet?

## 2024-02-09 ENCOUNTER — Ambulatory Visit: Admitting: Orthopedic Surgery

## 2024-02-23 ENCOUNTER — Ambulatory Visit (INDEPENDENT_AMBULATORY_CARE_PROVIDER_SITE_OTHER): Admitting: Orthopedic Surgery

## 2024-02-23 ENCOUNTER — Other Ambulatory Visit: Payer: Self-pay

## 2024-02-23 DIAGNOSIS — G5603 Carpal tunnel syndrome, bilateral upper limbs: Secondary | ICD-10-CM | POA: Diagnosis not present

## 2024-02-23 DIAGNOSIS — M25531 Pain in right wrist: Secondary | ICD-10-CM | POA: Diagnosis not present

## 2024-02-23 DIAGNOSIS — M19131 Post-traumatic osteoarthritis, right wrist: Secondary | ICD-10-CM | POA: Diagnosis not present

## 2024-02-23 DIAGNOSIS — M180 Bilateral primary osteoarthritis of first carpometacarpal joints: Secondary | ICD-10-CM | POA: Diagnosis not present

## 2024-02-23 MED ORDER — LIDOCAINE HCL 1 % IJ SOLN
1.0000 mL | INTRAMUSCULAR | Status: AC | PRN
  Administered 2024-02-23: 1 mL

## 2024-02-23 MED ORDER — BETAMETHASONE SOD PHOS & ACET 6 (3-3) MG/ML IJ SUSP
6.0000 mg | INTRAMUSCULAR | Status: AC | PRN
Start: 2024-02-23 — End: 2024-02-23
  Administered 2024-02-23: 6 mg via INTRA_ARTICULAR
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
## 2024-03-08 ENCOUNTER — Ambulatory Visit: Admitting: Orthopedic Surgery

## 2024-03-11 ENCOUNTER — Encounter (HOSPITAL_BASED_OUTPATIENT_CLINIC_OR_DEPARTMENT_OTHER): Payer: Self-pay | Admitting: Orthopedic Surgery

## 2024-03-14 ENCOUNTER — Other Ambulatory Visit: Payer: Self-pay

## 2024-03-14 ENCOUNTER — Encounter (HOSPITAL_BASED_OUTPATIENT_CLINIC_OR_DEPARTMENT_OTHER): Payer: Self-pay | Admitting: Orthopedic Surgery

## 2024-03-17 ENCOUNTER — Encounter (HOSPITAL_BASED_OUTPATIENT_CLINIC_OR_DEPARTMENT_OTHER): Payer: Self-pay | Admitting: Orthopedic Surgery

## 2024-03-17 ENCOUNTER — Other Ambulatory Visit: Payer: Self-pay

## 2024-03-17 DIAGNOSIS — M180 Bilateral primary osteoarthritis of first carpometacarpal joints: Secondary | ICD-10-CM

## 2024-03-17 NOTE — Anesthesia Preprocedure Evaluation (Signed)
 Anesthesia Evaluation  Patient identified by MRN, date of birth, ID band Patient awake  General Assessment Comment:Hx/o respiratory arrest post op due to overmedication  Reviewed: Allergy & Precautions, NPO status , Patient's Chart, lab work & pertinent test results  History of Anesthesia Complications (+) PONV and history of anesthetic complications  Airway Mallampati: II  TM Distance: >3 FB     Dental no notable dental hx. (+) Teeth Intact, Dental Advisory Given, Caps   Pulmonary asthma , pneumonia, resolved   Pulmonary exam normal breath sounds clear to auscultation       Cardiovascular negative cardio ROS Normal cardiovascular exam Rhythm:Regular Rate:Normal     Neuro/Psych  PSYCHIATRIC DISORDERS Anxiety Depression    Left carpal tunnel syndromne  Neuromuscular disease    GI/Hepatic negative GI ROS, Neg liver ROS,,,  Endo/Other  negative endocrine ROS    Renal/GU Renal InsufficiencyRenal disease  negative genitourinary   Musculoskeletal  (+) Arthritis , Osteoarthritis,  DDD lumbar and cervical spine Hx/o scoliosis Post laminectomy syndrome Left carpometacarpal arthrosis   Abdominal   Peds  Hematology negative hematology ROS (+)   Anesthesia Other Findings   Reproductive/Obstetrics                              Anesthesia Physical Anesthesia Plan  ASA: 2  Anesthesia Plan: Regional and MAC   Post-op Pain Management: Regional block* and Minimal or no pain anticipated   Induction: Intravenous  PONV Risk Score and Plan: 4 or greater and Treatment may vary due to age or medical condition, Ondansetron , Dexamethasone  and Propofol  infusion  Airway Management Planned: Natural Airway, Nasal Cannula and Simple Face Mask  Additional Equipment: None  Intra-op Plan:   Post-operative Plan: Extubation in OR  Informed Consent: I have reviewed the patients History and Physical, chart,  labs and discussed the procedure including the risks, benefits and alternatives for the proposed anesthesia with the patient or authorized representative who has indicated his/her understanding and acceptance.     Dental advisory given  Plan Discussed with: Anesthesiologist and CRNA  Anesthesia Plan Comments:          Anesthesia Quick Evaluation

## 2024-03-18 ENCOUNTER — Ambulatory Visit (HOSPITAL_BASED_OUTPATIENT_CLINIC_OR_DEPARTMENT_OTHER)
Admission: RE | Admit: 2024-03-18 | Discharge: 2024-03-18 | Disposition: A | Discharge status: Home / Self Care | Attending: Orthopedic Surgery | Admitting: Orthopedic Surgery

## 2024-03-18 ENCOUNTER — Encounter (HOSPITAL_BASED_OUTPATIENT_CLINIC_OR_DEPARTMENT_OTHER): Payer: Self-pay | Admitting: Orthopedic Surgery

## 2024-03-18 ENCOUNTER — Encounter (HOSPITAL_BASED_OUTPATIENT_CLINIC_OR_DEPARTMENT_OTHER)
Admission: RE | Disposition: A | Discharge status: Home / Self Care | Payer: Self-pay | Source: Home / Self Care | Attending: Orthopedic Surgery

## 2024-03-18 ENCOUNTER — Ambulatory Visit (HOSPITAL_BASED_OUTPATIENT_CLINIC_OR_DEPARTMENT_OTHER)

## 2024-03-18 ENCOUNTER — Encounter (HOSPITAL_BASED_OUTPATIENT_CLINIC_OR_DEPARTMENT_OTHER): Payer: Self-pay | Admitting: Anesthesiology

## 2024-03-18 ENCOUNTER — Other Ambulatory Visit: Payer: Self-pay

## 2024-03-18 ENCOUNTER — Ambulatory Visit (HOSPITAL_BASED_OUTPATIENT_CLINIC_OR_DEPARTMENT_OTHER): Payer: Self-pay | Admitting: Anesthesiology

## 2024-03-18 DIAGNOSIS — M1812 Unilateral primary osteoarthritis of first carpometacarpal joint, left hand: Secondary | ICD-10-CM

## 2024-03-18 DIAGNOSIS — G5602 Carpal tunnel syndrome, left upper limb: Secondary | ICD-10-CM | POA: Diagnosis not present

## 2024-03-18 DIAGNOSIS — G5603 Carpal tunnel syndrome, bilateral upper limbs: Secondary | ICD-10-CM | POA: Insufficient documentation

## 2024-03-18 DIAGNOSIS — M180 Bilateral primary osteoarthritis of first carpometacarpal joints: Secondary | ICD-10-CM | POA: Insufficient documentation

## 2024-03-18 DIAGNOSIS — Z981 Arthrodesis status: Secondary | ICD-10-CM | POA: Diagnosis not present

## 2024-03-18 HISTORY — PX: CARPAL TUNNEL RELEASE: SHX101

## 2024-03-18 HISTORY — PX: FINGER ARTHROPLASTY: SHX5017

## 2024-03-18 SURGERY — CARPAL TUNNEL RELEASE
Anesthesia: Monitor Anesthesia Care | Site: Hand | Laterality: Left

## 2024-03-18 MED ORDER — MIDAZOLAM HCL 2 MG/2ML IJ SOLN
INTRAMUSCULAR | Status: AC
  Filled 2024-03-18: qty 2

## 2024-03-18 MED ORDER — GELATIN ABSORBABLE 100 CM EX MISC
CUTANEOUS | Status: DC | PRN
  Administered 2024-03-18: 1

## 2024-03-18 MED ORDER — PROPOFOL 500 MG/50ML IV EMUL
INTRAVENOUS | Status: AC
  Filled 2024-03-18: qty 50

## 2024-03-18 MED ORDER — DEXAMETHASONE SODIUM PHOSPHATE 4 MG/ML IJ SOLN
INTRAMUSCULAR | Status: DC | PRN
  Administered 2024-03-18: 5 mg via INTRAVENOUS

## 2024-03-18 MED ORDER — OXYCODONE HCL 5 MG PO TABS: 5.0000 mg | ORAL_TABLET | Freq: Four times a day (QID) | ORAL | 0 refills | Status: AC | PRN

## 2024-03-18 MED ORDER — FENTANYL CITRATE (PF) 100 MCG/2ML IJ SOLN
INTRAMUSCULAR | Status: AC
  Filled 2024-03-18: qty 2

## 2024-03-18 MED ORDER — PROPOFOL 10 MG/ML IV BOLUS
INTRAVENOUS | Status: AC
Start: 2024-03-18 — End: 2024-03-18
  Filled 2024-03-18: qty 20

## 2024-03-18 MED ORDER — OXYCODONE HCL 5 MG/5ML PO SOLN: 5.0000 mg | Freq: Once | ORAL | Status: DC | PRN

## 2024-03-18 MED ORDER — DEXAMETHASONE SODIUM PHOSPHATE 10 MG/ML IJ SOLN
INTRAMUSCULAR | Status: DC | PRN
  Administered 2024-03-18: 4 mg via PERINEURAL

## 2024-03-18 MED ORDER — FENTANYL CITRATE (PF) 100 MCG/2ML IJ SOLN: 25.0000 ug | INTRAMUSCULAR | Status: DC | PRN

## 2024-03-18 MED ORDER — ROPIVACAINE HCL 5 MG/ML IJ SOLN
INTRAMUSCULAR | Status: DC | PRN
  Administered 2024-03-18: 30 mL via PERINEURAL

## 2024-03-18 MED ORDER — ONDANSETRON HCL 4 MG/2ML IJ SOLN
INTRAMUSCULAR | Status: AC
  Filled 2024-03-18: qty 2

## 2024-03-18 MED ORDER — DEXAMETHASONE SODIUM PHOSPHATE 10 MG/ML IJ SOLN
INTRAMUSCULAR | Status: AC
  Filled 2024-03-18: qty 1

## 2024-03-18 MED ORDER — LACTATED RINGERS IV SOLN: INTRAVENOUS | Status: DC

## 2024-03-18 MED ORDER — OXYCODONE HCL 5 MG PO TABS: 5.0000 mg | ORAL_TABLET | Freq: Once | ORAL | Status: DC | PRN

## 2024-03-18 MED ORDER — DROPERIDOL 2.5 MG/ML IJ SOLN: 0.6250 mg | Freq: Once | INTRAMUSCULAR | Status: DC | PRN

## 2024-03-18 MED ORDER — SODIUM CHLORIDE 0.9 % IV SOLN: 12.5000 mg | INTRAVENOUS | Status: DC | PRN

## 2024-03-18 MED ORDER — MIDAZOLAM HCL 2 MG/2ML IJ SOLN
2.0000 mg | Freq: Once | INTRAMUSCULAR | Status: AC
  Administered 2024-03-18: 1 mg via INTRAVENOUS

## 2024-03-18 MED ORDER — PROPOFOL 500 MG/50ML IV EMUL
INTRAVENOUS | Status: DC | PRN
  Administered 2024-03-18: 100 ug/kg/min via INTRAVENOUS

## 2024-03-18 MED ORDER — CEFAZOLIN SODIUM-DEXTROSE 2-4 GM/100ML-% IV SOLN
2.0000 g | INTRAVENOUS | Status: AC
  Administered 2024-03-18: 2 g via INTRAVENOUS

## 2024-03-18 MED ORDER — ONDANSETRON HCL 4 MG/2ML IJ SOLN
INTRAMUSCULAR | Status: DC | PRN
  Administered 2024-03-18: 4 mg via INTRAVENOUS

## 2024-03-18 MED ORDER — SODIUM CHLORIDE 0.9 % IR SOLN
Status: DC | PRN
  Administered 2024-03-18: 150 mL

## 2024-03-18 MED ORDER — MIDAZOLAM HCL 5 MG/5ML IJ SOLN
INTRAMUSCULAR | Status: DC | PRN
  Administered 2024-03-18 (×2): 1 mg via INTRAVENOUS

## 2024-03-18 MED ORDER — CEFAZOLIN SODIUM-DEXTROSE 2-4 GM/100ML-% IV SOLN
INTRAVENOUS | Status: AC
  Filled 2024-03-18: qty 100

## 2024-03-18 MED ORDER — FENTANYL CITRATE (PF) 100 MCG/2ML IJ SOLN
100.0000 ug | Freq: Once | INTRAMUSCULAR | Status: AC
  Administered 2024-03-18: 50 ug via INTRAVENOUS

## 2024-03-18 SURGICAL SUPPLY — 62 items
ANCHOR REPAIR HAND WRIST (Anchor) IMPLANT
BLADE MINI RND TIP GREEN BEAV (BLADE) ×3 IMPLANT
BLADE SURG 15 STRL LF DISP TIS (BLADE) ×6 IMPLANT
BNDG COHESIVE 4X5 TAN STRL LF (GAUZE/BANDAGES/DRESSINGS) ×3 IMPLANT
BNDG COMPR ESMARK 4X3 LF (GAUZE/BANDAGES/DRESSINGS) ×3 IMPLANT
BNDG ELASTIC 4INX 5YD STR LF (GAUZE/BANDAGES/DRESSINGS) ×6 IMPLANT
BNDG GAUZE DERMACEA FLUFF 4 (GAUZE/BANDAGES/DRESSINGS) ×3 IMPLANT
BUR EGG 3PK/BX (BURR) IMPLANT
BUR OVAL CARBIDE 4.0 (BURR) IMPLANT
BUR PEAR (BURR) IMPLANT
CHLORAPREP W/TINT 26 (MISCELLANEOUS) ×3 IMPLANT
CORD BIPOLAR FORCEPS 12FT (ELECTRODE) ×3 IMPLANT
COVER BACK TABLE 60X90IN (DRAPES) ×3 IMPLANT
CUFF TOURN SGL QUICK 18X4 (TOURNIQUET CUFF) IMPLANT
CUFF TRNQT CYL 24X4X16.5-23 (TOURNIQUET CUFF) ×3 IMPLANT
DERMABOND ADVANCED .7 DNX12 (GAUZE/BANDAGES/DRESSINGS) IMPLANT
DRAPE HAND 77X146 (DRAPES) ×3 IMPLANT
DRAPE OEC MINIVIEW 54X84 (DRAPES) ×3 IMPLANT
DRAPE SURG 17X23 STRL (DRAPES) ×3 IMPLANT
GAUZE PAD ABD 8X10 STRL (GAUZE/BANDAGES/DRESSINGS) ×3 IMPLANT
GAUZE SPONGE 4X4 12PLY STRL (GAUZE/BANDAGES/DRESSINGS) ×3 IMPLANT
GAUZE STRETCH 2X75IN STRL (MISCELLANEOUS) ×3 IMPLANT
GAUZE XEROFORM 1X8 LF (GAUZE/BANDAGES/DRESSINGS) ×3 IMPLANT
GLOVE BIO SURGEON STRL SZ7.5 (GLOVE) ×6 IMPLANT
GLOVE BIOGEL PI IND STRL 7.5 (GLOVE) ×6 IMPLANT
GOWN STRL REUS W/ TWL LRG LVL3 (GOWN DISPOSABLE) ×6 IMPLANT
GOWN STRL REUS W/TWL XL LVL3 (GOWN DISPOSABLE) ×3 IMPLANT
GOWN STRL SURGICAL XL XLNG (GOWN DISPOSABLE) ×6 IMPLANT
GUIDEWIRE .062X7IN LONG (WIRE) IMPLANT
MANIFOLD NEPTUNE II (INSTRUMENTS) ×3 IMPLANT
NDL HYPO 25X1 1.5 SAFETY (NEEDLE) IMPLANT
NDL HYPO 25X5/8 SAFETYGLIDE (NEEDLE) IMPLANT
NDL KEITH (NEEDLE) IMPLANT
NEEDLE HYPO 25X1 1.5 SAFETY (NEEDLE) IMPLANT
NEEDLE HYPO 25X5/8 SAFETYGLIDE (NEEDLE) IMPLANT
NEEDLE KEITH (NEEDLE) IMPLANT
NS IRRIG 1000ML POUR BTL (IV SOLUTION) IMPLANT
PACK BASIN DAY SURGERY FS (CUSTOM PROCEDURE TRAY) ×3 IMPLANT
PAD CAST 4YDX4 CTTN HI CHSV (CAST SUPPLIES) ×6 IMPLANT
SHEET MEDIUM DRAPE 40X70 STRL (DRAPES) ×3 IMPLANT
SLEEVE SCD COMPRESS KNEE MED (STOCKING) ×3 IMPLANT
SLING ARM FOAM STRAP MED (SOFTGOODS) IMPLANT
SPIKE FLUID TRANSFER (MISCELLANEOUS) IMPLANT
SPLINT FIBERGLASS 4X30 (CAST SUPPLIES) IMPLANT
SPLINT PLASTER CAST XFAST 4X15 (CAST SUPPLIES) ×30 IMPLANT
SPONGE SURGIFOAM ABS GEL 12-7 (HEMOSTASIS) IMPLANT
STOCKINETTE IMPERVIOUS 9X36 MD (GAUZE/BANDAGES/DRESSINGS) ×3 IMPLANT
SUCTION TUBE FRAZIER 10FR DISP (SUCTIONS) IMPLANT
SUT ETHILON 4 0 PS 2 18 (SUTURE) ×3 IMPLANT
SUT MNCRL AB 3-0 PS2 27 (SUTURE) ×3 IMPLANT
SUT MNCRL AB 4-0 PS2 18 (SUTURE) IMPLANT
SUT PDS AB 4-0 RB1 27 (SUTURE) IMPLANT
SUT SILK 4 0 PS 2 (SUTURE) IMPLANT
SUT VIC AB 3-0 FS2 27 (SUTURE) IMPLANT
SUT VIC AB 3-0 SH 27X BRD (SUTURE) IMPLANT
SUT VIC AB 4-0 PS2 18 (SUTURE) IMPLANT
SUTURE 0 FIBERLP 38 BLU TPR ND (SUTURE) IMPLANT
SYR BULB EAR ULCER 3OZ GRN STR (SYRINGE) ×6 IMPLANT
SYR CONTROL 10ML LL (SYRINGE) ×3 IMPLANT
TOWEL GREEN STERILE FF (TOWEL DISPOSABLE) ×6 IMPLANT
TUBE CONNECTING 20X1/4 (TUBING) IMPLANT
UNDERPAD 30X36 HEAVY ABSORB (UNDERPADS AND DIAPERS) ×3 IMPLANT

## 2024-03-18 NOTE — Discharge Instructions (Addendum)
 Hand Surgery Postop Instructions   Dressings: Maintain postoperative dressing until orthopedic follow-up.  Keep operative site clean and dry until orthopedic follow-up.  Wound Care: Keep your hand elevated above the level of your heart.  Do not allow it to dangle by your side. Moving your fingers is advised to stimulate circulation but will depend on the site of your surgery.  If you have a splint applied, your doctor will advise you regarding movement.  Activity: Do not drive or operate machinery until clearance given from physician. No heavy lifting with operative extremity.  Diet:  Drink liquids today or eat a light diet.  You may resume a regular diet tomorrow.    General expectations: Take prescribed medication if given, transition to over-the-counter medication as quickly as possible. Fingers may become slightly swollen.  Call your doctor if any of the following occur: Severe pain not relieved by pain medication. Elevated temperature. Dressing soaked with blood. Inability to move fingers. White or bluish color to fingers.   Per [facility] clinic policy, our goal is ensure optimal postoperative pain control with a multimodal pain management strategy. For all [facility] patients, our goal is to wean post-operative narcotic medications by 6 weeks post-operatively. If this is not possible due to utilization of pain medication prior to surgery, your [facility] doctor will support your acute post-operative pain control for the first 6 weeks postoperatively, with a plan to transition you back to your primary pain team following that. Cyndia Skeeters will work to ensure a Therapist, occupational.  Anshul Trevor Mace, M.D. Hand Surgery [facility] Anesthesia Blocks  1. You may not be able to move or feel the "blocked" extremity after a regional anesthetic block. This may last may last from 3-48 hours after placement, but it will go away. The length of time depends on the  medication injected and your individual response to the medication. As the nerves start to wake up, you may experience tingling as the movement and feeling returns to your extremity. If the numbness and inability to move your extremity has not gone away after 48 hours, please call your surgeon.   2. The extremity that is blocked will need to be protected until the numbness is gone and the strength has returned. Because you cannot feel it, you will need to take extra care to avoid injury. Because it may be weak, you may have difficulty moving it or using it. You may not know what position it is in without looking at it while the block is in effect.  3. For blocks in the legs and feet, returning to weight bearing and walking needs to be done carefully. You will need to wait until the numbness is entirely gone and the strength has returned. You should be able to move your leg and foot normally before you try and bear weight or walk. You will need someone to be with you when you first try to ensure you do not fall and possibly risk injury.  4. Bruising and tenderness at the needle site are common side effects and will resolve in a few days.  5. Persistent numbness or new problems with movement should be communicated to the surgeon or the [facility] [phone] [facility] [phone]).  Post Anesthesia Home Care Instructions  Activity: Get plenty of rest for the remainder of the day. A responsible individual must stay with you for 24 hours following the procedure.  For the next 24 hours, DO NOT: -  Drive a car -Advertising copywriter -Drink alcoholic beverages -Take any medication unless instructed by your physician -Make any legal decisions or sign important papers.  Meals: Start with liquid foods such as gelatin or soup. Progress to regular foods as tolerated. Avoid greasy, spicy, heavy foods. If nausea and/or vomiting occur, drink only clear liquids until the nausea  and/or vomiting subsides. Call your physician if vomiting continues.  Special Instructions/Symptoms: Your throat may feel dry or sore from the anesthesia or the breathing tube placed in your throat during surgery. If this causes discomfort, gargle with warm salt water. The discomfort should disappear within 24 hours.  If you had a scopolamine patch placed behind your ear for the management of post- operative nausea and/or vomiting:  1. The medication in the patch is effective for 72 hours, after which it should be removed.  Wrap patch in a tissue and discard in the trash. Wash hands thoroughly with soap and water. 2. You may remove the patch earlier than 72 hours if you experience unpleasant side effects which may include dry mouth, dizziness or visual disturbances. 3. Avoid touching the patch. Wash your hands with soap and water after contact with the patch.

## 2024-03-18 NOTE — Addendum Note (Signed)
 Addendum  created 03/18/24 1713 by Pam Macario BROCKS, CRNA   Intraprocedure Event edited

## 2024-03-18 NOTE — Anesthesia Procedure Notes (Signed)
 Anesthesia Regional Block: Supraclavicular block   Pre-Anesthetic Checklist: , timeout performed,  Correct Patient, Correct Site, Correct Laterality,  Correct Procedure, Correct Position, site marked,  Risks and benefits discussed,  Surgical consent,  Pre-op evaluation,  At surgeon's request  Laterality: Left  Prep: chloraprep       Needles:  Injection technique: Single-shot  Needle Type: Echogenic Stimulator Needle     Needle Length: 10cm  Needle Gauge: 21   Needle insertion depth: 6 cm   Additional Needles:   Procedures:,,,, ultrasound used (permanent image in chart),,   Motor weakness within 5 minutes.  Narrative:  Start time: 03/18/2024 7:50 AM End time: 03/18/2024 7:55 AM Injection made incrementally with aspirations every 5 mL.  Performed by: Personally  Anesthesiologist: Jerrye Sharper, MD  Additional Notes: Timeout performed. Patient sedated. Relevant anatomy ID'd using US . Incremental 2-5ml injection of LA with frequent aspiration. Patient tolerated procedure well.

## 2024-03-18 NOTE — Anesthesia Postprocedure Evaluation (Signed)
 Anesthesia Post Note  Patient: Stephanie Noble  Procedure(s) Performed: CARPAL TUNNEL RELEASE (Left: Hand) ARTHROPLASTY, FINGER (Left: Finger)     Patient location during evaluation: PACU Anesthesia Type: Regional and MAC Level of consciousness: awake and alert and oriented Pain management: pain level controlled Vital Signs Assessment: post-procedure vital signs reviewed and stable Respiratory status: spontaneous breathing, nonlabored ventilation and respiratory function stable Cardiovascular status: stable and blood pressure returned to baseline Postop Assessment: no apparent nausea or vomiting Anesthetic complications: no   No notable events documented.  Last Vitals:  Vitals:   03/18/24 1015 03/18/24 1028  BP: (!) 148/92   Pulse: 64 69  Resp: 16 (!) 21  Temp:    SpO2: 96% 90%    Last Pain:  Vitals:   03/18/24 1002  TempSrc:   PainSc: 0-No pain                 Aric Jost A.

## 2024-03-18 NOTE — Transfer of Care (Signed)
 Immediate Anesthesia Transfer of Care Note  Patient: Stephanie Noble  Procedure(s) Performed: CARPAL TUNNEL RELEASE (Left: Hand) ARTHROPLASTY, FINGER (Left: Finger)  Patient Location: PACU  Anesthesia Type:MAC and Regional  Level of Consciousness: awake, alert , and oriented  Airway & Oxygen Therapy: Patient Spontanous Breathing and Patient connected to face mask oxygen  Post-op Assessment: Report given to RN  Post vital signs: Reviewed and stable  Last Vitals:  Vitals Value Taken Time  BP    Temp    Pulse 66 03/18/24 10:03  Resp 22 03/18/24 10:03  SpO2 95 % 03/18/24 10:03  Vitals shown include unfiled device data.  Last Pain:  Vitals:   03/18/24 0657  TempSrc: Tympanic  PainSc: 0-No pain      Patients Stated Pain Goal: 6 (03/18/24 0657)  Complications: No notable events documented.

## 2024-03-18 NOTE — Interval H&P Note (Signed)
 History and Physical Interval Note:  03/18/2024 6:53 AM  Stephanie Noble  has presented today for surgery, with the diagnosis of LEFT CARPAL TUNNEL SYNDROME LEFT THUMB CARPOMETACARPAL  ARTHRITIS.  The various methods of treatment have been discussed with the patient and family. After consideration of risks, benefits and other options for treatment, the patient has consented to  Procedure(s) with comments: CARPAL TUNNEL RELEASE (Left) - LEFT OPEN CARPAL TUNNEL RELEASE/ LEFT THUMB CARPOMETACARPAL ARTHROPLASTY WITH INTERNAL BRACE ARTHROPLASTY, FINGER (Left) as a surgical intervention.  The patient's history has been reviewed, patient examined, no change in status, stable for surgery.  I have reviewed the patient's chart and labs.  Questions were answered to the patient's satisfaction.     Sayler Mickiewicz

## 2024-03-18 NOTE — Progress Notes (Signed)
Assisted Dr. Foster with left, supraclavicular, ultrasound guided block. Side rails up, monitors on throughout procedure. See vital signs in flow sheet. Tolerated Procedure well. 

## 2024-03-18 NOTE — Op Note (Signed)
 NAME: Stephanie Noble MEDICAL RECORD NO: [id number] DATE OF BIRTH: 1955/12/19 FACILITY: [facility] LOCATION: [facility] PHYSICIAN: GILDARDO ALDERTON, MD   OPERATIVE REPORT   DATE OF PROCEDURE: 03/18/24    PREOPERATIVE DIAGNOSIS: Left thumb carpometacarpal arthritis with associated carpal tunnel syndrome    POSTOPERATIVE DIAGNOSIS:   Left thumb carpometacarpal arthritis with associated carpal tunnel syndrome   PROCEDURE: Left thumb carpometacarpal arthroplasty Left open carpal tunnel release Left tendon transfer abductor pollicis longus to flexor carpi radialis tendon Left partial trapezoidectomy Left de Quervain's release surgery   SURGEON:  GILDARDO ALDERTON, M.D.   ASSISTANT: Almeda Rummer, PA   ANESTHESIA:  Regional with sedation   INTRAVENOUS FLUIDS:  Per anesthesia flow sheet.   ESTIMATED BLOOD LOSS:  Minimal.   COMPLICATIONS:  None.   SPECIMENS:  none   TOURNIQUET TIME:    Total Tourniquet Time Documented: Upper Arm (Left) - 42 minutes Total: Upper Arm (Left) - 42 minutes    DISPOSITION:  Stable to PACU.   INDICATIONS: 68 year old female with history of clinical and radiographic evidence of left-sided carpal metacarpal arthritis that was refractory to conservative care with associated STT arthritis.  Patient was also found to have clinical electrodiagnostic evidence of left-sided carpal tunnel syndrome that was refractory to conservative care.  Symptoms in the hand for approximately right carpometacarpal arthroplasty with associated carpal tunnel release.  Risks and benefits of surgery were discussed including the risks of infection, bleeding, scarring, stiffness, nerve injury, vascular injury, tendon injury, need for subsequent operation, persistent numbness, subsidence, recurrence.  She voiced understanding of these risks and elected to proceed.  OPERATIVE COURSE: Patient was seen and identified in the preoperative area and marked appropriately.   Surgical consent had been signed. Preoperative IV antibiotic prophylaxis was given. She was transferred to the operating room and placed in supine position with the left upper extremity on an arm board.  Sedation was induced by the anesthesiologist. A regional block had been performed by anesthesia in preoperative holding.    Left upper extremity was prepped and draped in normal sterile orthopedic fashion.  A surgical pause was performed between the surgeons, anesthesia, and operating room staff and all were in agreement as to the patient, procedure, and site of procedure.  Tourniquet was placed and padded appropriately to the left upper arm.   The arm was exsanguinated and tourniquet was inflated to 250 mmHg.  We first began with the left open carpal tunnel release.  Longitudinal incision was designed in the thenar crease in line with the radial border of the ring finger, from intersection of Kaplan's cardinal line down to level of the distal wrist crease. Incision was carried down utilizing 15 blade. Blunt dissection was performed, palmar fascia was identified and incised sharply utilizing a Beaver blade. Careful dissection was performed down, thenar musculature was bluntly elevated and the transcarpal ligament was identified.  A Beaver blade was then utilized to divide the transcarpal ligament in a distal to proximal fashion.  Fat surrounding the palmar arch was encountered to confirm appropriate distal release.  At the level of the wrist crease, skin flaps were elevated to allow for release of the proximal portion of transverse carpal ligament as well as the antebrachial fascia into the forearm.  Significant hourglass deformity of the median nerve was appreciated intraoperatively, indicating ongoing compression.  Appropriate decompression was noted of the median nerve, care was taken to protect the nerve in its entirety throughout.     We then turned our  attention to the left thumb CMC arthroplasty.  A  straight line incision was made over the dorsum of the left thumb CMC joint at the glabrous/nonglabrous junction.  Crossing branches of the radial sensory nerve were identified and carefully protected.  The radial artery was identified and protected.  The first extensor compartment was first released.  The tendons of the first extensor compartment were identified within the incisional site.  The dorsal most aspect of the tendon sheath was then released and carried down over the radial styloid.   Following release of the first extensor compartment, CMC arthrotomy was completed.  Thick capsular flaps were elevated both radially and ulnarly.  The trapezium was identified and confirmed with biplanar fluoroscopy.  This was carefully freed and dissected, then removed utilizing a rongeur.  Care was taken to avoid injury to the underlying flexor carpi radialis tendon.  Following complete trapeziectomy, the scaphoid trapezoid joint was inspected.  There was noted to be cartilage loss and degenerative change of the joint involving the articular surface.   At this point, the Arthrex internal brace was utilized.  Base of the index metacarpal was identified utilizing biplanar fluoroscopy, the K wire was inserted into the nonarticular portion followed by drill and the first suture anchor.  Biplanar fluoroscopy was utilized to confirm appropriate positioning of the wire prior to anchor placement.  Stability was noted to this suture anchor.  Subsequently, the thumb metacarpal base was identified followed by placement of the additional suture anchor and suspension of the internal brace.  Thumb was held in gently abducted position for internal brace placement.  Biplanar fluoroscopy was utilized to confirm appropriate thumb metacarpal positioning, no significant subsidence was noted with gentle stress testing.   At this stage in the procedure, the FCR tendon was identified and the depth of the wound.  The APL tendons were  identified at their distal extent overlying the base the metacarpal.  An 0 FiberWire was utilized to create a tendon transfer between the APL tendon slips and the FCR with traction on the thumb.  Excellent stability was noted.  The void created by the trapeziectomy was filled with a Gelfoam.  The capsular flaps were repaired utilizing 3-0 Vicryl suture in figure-of-eight fashion.  The tourniquet was deflated at 42 minutes.  Fingertips were pink with brisk capillary refill after deflation of tourniquet.  Copious irrigation was performed of both wound sites.  Carpal tunnel incision was closed utilizing 4-0 nylon in horizontal mattress fashion.  Thumb CMC incision was closed utilizing Nation of 3-0 and 4-0 Monocryl for the subcutaneous and skin surface, Dermabond was applied.  Sterile dressings were applied followed by application of a thumb spica splint utilizing plaster.  Sling was also applied.  The operative drapes were broken down.  The patient was awoken from anesthesia safely and taken to PACU in stable condition.   Post-operative plan: The patient will recover in the post-anesthesia care unit and then be discharged home.  The patient will be non weight bearing on the left upper extremity in a spica splint.   I will see the patient back in the office in 2 weeks for postoperative followup.    A surgical assistant was utilized throughout this procedure.  Their presence was helpful in protecting critical neurovascular structures and during the critical portions of the procedure.  The assistant was also critical in minimizing operative time and patient morbidity.   Dorise Gangi, MD Electronically signed, 03/18/24

## 2024-03-18 NOTE — Anesthesia Procedure Notes (Signed)
 Procedure Name: MAC Date/Time: 03/18/2024 8:20 AM  Performed by: Pam Macario BROCKS, CRNAPre-anesthesia Checklist: Timeout performed, Patient being monitored, Suction available, Emergency Drugs available and Patient identified Patient Re-evaluated:Patient Re-evaluated prior to induction Oxygen Delivery Method: Simple face mask Preoxygenation: Pre-oxygenation with 100% oxygen Induction Type: IV induction Placement Confirmation: breath sounds checked- equal and bilateral, CO2 detector and positive ETCO2

## 2024-03-21 ENCOUNTER — Encounter (HOSPITAL_BASED_OUTPATIENT_CLINIC_OR_DEPARTMENT_OTHER): Payer: Self-pay | Admitting: Orthopedic Surgery

## 2024-03-28 ENCOUNTER — Encounter: Payer: Self-pay | Admitting: Orthopedic Surgery

## 2024-03-28 ENCOUNTER — Other Ambulatory Visit: Payer: Self-pay | Admitting: Orthopedic Surgery

## 2024-03-29 ENCOUNTER — Other Ambulatory Visit: Payer: Self-pay | Admitting: Orthopedic Surgery

## 2024-03-29 MED ORDER — OXYCODONE HCL 5 MG PO TABS: 5.0000 mg | ORAL_TABLET | Freq: Four times a day (QID) | ORAL | 0 refills | Status: AC | PRN
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
## 2024-03-31 ENCOUNTER — Encounter: Payer: Self-pay | Admitting: Rehabilitative and Restorative Service Providers"

## 2024-03-31 ENCOUNTER — Ambulatory Visit (INDEPENDENT_AMBULATORY_CARE_PROVIDER_SITE_OTHER): Admitting: Orthopedic Surgery

## 2024-03-31 ENCOUNTER — Ambulatory Visit: Payer: Self-pay

## 2024-03-31 ENCOUNTER — Ambulatory Visit (INDEPENDENT_AMBULATORY_CARE_PROVIDER_SITE_OTHER): Admitting: Rehabilitative and Restorative Service Providers"

## 2024-03-31 DIAGNOSIS — M25532 Pain in left wrist: Secondary | ICD-10-CM | POA: Diagnosis not present

## 2024-03-31 DIAGNOSIS — M180 Bilateral primary osteoarthritis of first carpometacarpal joints: Secondary | ICD-10-CM | POA: Diagnosis not present

## 2024-03-31 DIAGNOSIS — R278 Other lack of coordination: Secondary | ICD-10-CM

## 2024-03-31 DIAGNOSIS — R600 Localized edema: Secondary | ICD-10-CM

## 2024-03-31 DIAGNOSIS — M6281 Muscle weakness (generalized): Secondary | ICD-10-CM

## 2024-03-31 DIAGNOSIS — M25642 Stiffness of left hand, not elsewhere classified: Secondary | ICD-10-CM

## 2024-03-31 DIAGNOSIS — R202 Paresthesia of skin: Secondary | ICD-10-CM

## 2024-03-31 DIAGNOSIS — M79642 Pain in left hand: Secondary | ICD-10-CM

## 2024-03-31 NOTE — Progress Notes (Signed)
 x  Stephanie Noble - 68 y.o. female MRN [id number]  Date of birth: [birth year]  Office Visit Note: Visit Date: 03/31/2024 PCP: Tobie Suzzane POUR, MD Referred by: Tobie Suzzane POUR, MD  Subjective:  HPI: Stephanie Noble is a 68 y.o. female who presents today for follow up 2 weeks status post left thumb carpometacarpal arthroplasty, left open carpal tunnel release.  She is doing quite well postoperatively, pain is controlled at rest.  Has been compliant with splinting as instructed.  Pertinent ROS were reviewed with the patient and found to be negative unless otherwise specified above in HPI.   Assessment & Plan: Visit Diagnoses:  1. Arthritis of carpometacarpal (CMC) joint of both thumbs     Plan: She is doing very well postoperatively.  She will be seen by occupational therapy today for fabrication of a removable orthosis.  Begin range of motion exercises in 2 weeks.  Remain nonweightbearing at this time.  Follow-up myself in 4 weeks.  Follow-up: No follow-ups on file.   Meds & Orders: No orders of the defined types were placed in this encounter.   Orders Placed This Encounter  Procedures   XR Wrist Complete Left     Procedures: No procedures performed       Objective:   Vital Signs: There were no vitals taken for this visit.  Ortho Exam Left wrist: - Well-healed incision at the glabrous/nonglabrous juncture over the [facility] region of the thumb, well-healed palmar incision, sutures removed today - Thumb circumduction without significant pain or crepitus - Hand is warm well-perfused, sensation intact in all distributions including DRSN   Imaging: XR Wrist Complete Left Result Date: 03/31/2024 X-rays of the left wrist demonstrate stable appearance of the thumb metacarpal status post trapeziectomy    Leilan Bochenek Estela) Odean Mcelwain, M.D. [facility], Hand Surgery

## 2024-04-04 ENCOUNTER — Encounter: Payer: Self-pay | Admitting: Rehabilitative and Restorative Service Providers"
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
## 2024-04-19 ENCOUNTER — Encounter: Payer: Self-pay | Admitting: Rehabilitative and Restorative Service Providers"

## 2024-04-19 ENCOUNTER — Ambulatory Visit: Admitting: Rehabilitative and Restorative Service Providers"

## 2024-04-19 DIAGNOSIS — R600 Localized edema: Secondary | ICD-10-CM

## 2024-04-19 DIAGNOSIS — M79642 Pain in left hand: Secondary | ICD-10-CM

## 2024-04-19 DIAGNOSIS — M6281 Muscle weakness (generalized): Secondary | ICD-10-CM

## 2024-04-19 DIAGNOSIS — M25532 Pain in left wrist: Secondary | ICD-10-CM

## 2024-04-19 DIAGNOSIS — R278 Other lack of coordination: Secondary | ICD-10-CM

## 2024-04-19 DIAGNOSIS — M25642 Stiffness of left hand, not elsewhere classified: Secondary | ICD-10-CM

## 2024-04-21 ENCOUNTER — Encounter (HOSPITAL_BASED_OUTPATIENT_CLINIC_OR_DEPARTMENT_OTHER): Payer: Self-pay | Admitting: Orthopedic Surgery

## 2024-04-21 ENCOUNTER — Other Ambulatory Visit: Payer: Self-pay | Admitting: Internal Medicine

## 2024-04-21 DIAGNOSIS — M4800 Spinal stenosis, site unspecified: Secondary | ICD-10-CM

## 2024-04-21 DIAGNOSIS — G8929 Other chronic pain: Secondary | ICD-10-CM

## 2024-04-21 DIAGNOSIS — M4722 Other spondylosis with radiculopathy, cervical region: Secondary | ICD-10-CM

## 2024-04-21 DIAGNOSIS — Z9889 Other specified postprocedural states: Secondary | ICD-10-CM

## 2024-04-21 DIAGNOSIS — M170 Bilateral primary osteoarthritis of knee: Secondary | ICD-10-CM

## 2024-04-21 NOTE — Progress Notes (Signed)
 Addendum note - Witness 100mcg Fentanyl  waste into OR 8 waste container by [place] CRNA after unable to return to pyxis machine. Pharmacy made aware of discrepancy at this time.

## 2024-04-25 ENCOUNTER — Other Ambulatory Visit: Payer: Self-pay

## 2024-04-25 MED ORDER — FLUTICASONE PROPIONATE 50 MCG/ACT NA SUSP: 1.0000 | Freq: Every day | NASAL | 3 refills | Status: AC

## 2024-04-27 ENCOUNTER — Encounter: Admitting: Rehabilitative and Restorative Service Providers"

## 2024-05-02 ENCOUNTER — Encounter: Admitting: Rehabilitative and Restorative Service Providers"

## 2024-05-02 ENCOUNTER — Ambulatory Visit: Admitting: Orthopedic Surgery

## 2024-05-02 ENCOUNTER — Encounter: Payer: Self-pay | Admitting: Radiology

## 2024-05-02 DIAGNOSIS — G5603 Carpal tunnel syndrome, bilateral upper limbs: Secondary | ICD-10-CM

## 2024-05-02 DIAGNOSIS — M180 Bilateral primary osteoarthritis of first carpometacarpal joints: Secondary | ICD-10-CM

## 2024-05-02 NOTE — Progress Notes (Signed)
   Stephanie Noble - 68 y.o. female MRN [id number]  Date of birth: [birth year]  Office Visit Note: Visit Date: 05/02/2024 PCP: Tobie Suzzane POUR, MD Referred by: Tobie Suzzane POUR, MD  Subjective:  HPI: Stephanie Noble is a 68 y.o. female who presents today for follow up 6 weeks status post left thumb carpometacarpal arthroplasty, left open carpal tunnel release .  She is doing very well overall, pain is controlled, range of motion is improving.  Has not been able to attend all of her recent occupational therapy, does have a scheduled visit with OT next week.  Numbness and tingling is improving in the left hand.  Pertinent ROS were reviewed with the patient and found to be negative unless otherwise specified above in HPI.   Assessment & Plan: Visit Diagnoses: No diagnosis found.  Plan: She is doing quite well postoperatively, she is clearly been doing her home exercises as instructed for range of motion.  Continue with OT as scheduled, we will begin strengthening of the left thumb at week gait per protocol.  I have asked her to follow-up with myself in approximately 6 weeks for recheck.  Follow-up: No follow-ups on file.   Meds & Orders: No orders of the defined types were placed in this encounter.  No orders of the defined types were placed in this encounter.    Procedures: No procedures performed       Objective:   Vital Signs: There were no vitals taken for this visit.  Ortho Exam Left wrist: - Well-healed incision at the glabrous/nonglabrous juncture over the [facility] region of the thumb, well-healed palmar incision - Thumb opposition to the ring finger DPC - Thumb circumduction without significant pain or crepitus - Hand is warm well-perfused, sensation intact in all distributions including DRSN -Sensation intact to light touch in median nerve distribution, APB intact, thumb opposition intact   Imaging: No results found.   Justina Bertini Afton Alderton, M.D.  [facility],  Hand Surgery
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
## 2024-05-09 ENCOUNTER — Encounter: Payer: Self-pay | Admitting: Rehabilitative and Restorative Service Providers"

## 2024-05-09 ENCOUNTER — Ambulatory Visit (INDEPENDENT_AMBULATORY_CARE_PROVIDER_SITE_OTHER): Admitting: Rehabilitative and Restorative Service Providers"

## 2024-05-09 DIAGNOSIS — M6281 Muscle weakness (generalized): Secondary | ICD-10-CM

## 2024-05-09 DIAGNOSIS — M25532 Pain in left wrist: Secondary | ICD-10-CM

## 2024-05-09 DIAGNOSIS — R600 Localized edema: Secondary | ICD-10-CM

## 2024-05-09 DIAGNOSIS — R278 Other lack of coordination: Secondary | ICD-10-CM | POA: Diagnosis not present

## 2024-05-09 DIAGNOSIS — M79642 Pain in left hand: Secondary | ICD-10-CM

## 2024-05-09 DIAGNOSIS — R202 Paresthesia of skin: Secondary | ICD-10-CM

## 2024-05-09 DIAGNOSIS — M25642 Stiffness of left hand, not elsewhere classified: Secondary | ICD-10-CM

## 2024-05-30 ENCOUNTER — Encounter: Payer: Self-pay | Admitting: Internal Medicine

## 2024-05-30 ENCOUNTER — Ambulatory Visit: Admitting: Internal Medicine

## 2024-05-30 VITALS — BP 106/71 | HR 83 | Ht 65.0 in | Wt 149.4 lb

## 2024-05-30 DIAGNOSIS — M1812 Unilateral primary osteoarthritis of first carpometacarpal joint, left hand: Secondary | ICD-10-CM

## 2024-05-30 DIAGNOSIS — F5104 Psychophysiologic insomnia: Secondary | ICD-10-CM | POA: Diagnosis not present

## 2024-05-30 DIAGNOSIS — F4310 Post-traumatic stress disorder, unspecified: Secondary | ICD-10-CM | POA: Diagnosis not present

## 2024-05-30 DIAGNOSIS — Z23 Encounter for immunization: Secondary | ICD-10-CM | POA: Diagnosis not present

## 2024-05-30 DIAGNOSIS — R7989 Other specified abnormal findings of blood chemistry: Secondary | ICD-10-CM | POA: Diagnosis not present

## 2024-05-30 DIAGNOSIS — M4722 Other spondylosis with radiculopathy, cervical region: Secondary | ICD-10-CM

## 2024-05-30 MED ORDER — ZOLPIDEM TARTRATE 10 MG PO TABS: 10.0000 mg | ORAL_TABLET | Freq: Every evening | ORAL | 5 refills | Status: AC | PRN

## 2024-05-30 NOTE — Assessment & Plan Note (Signed)
 S/p C3-C4 decompression with fusion surgery Has persistent neck pain, recent worsening On gabapentin  and Robaxin  On Cymbalta  60 mg once daily Continue Celebrex  100 mg twice daily as needed Followed by Spine Surgery

## 2024-05-30 NOTE — Assessment & Plan Note (Signed)
 Well-controlled with Ambien  10 mg at bedtime PDMP reviewed, refilled Ambien 

## 2024-05-30 NOTE — Assessment & Plan Note (Signed)
 Followed by Hand Surgery Status post left thumb carpometacarpal arthroplasty, left open carpal tunnel release Celebrex  PRN for pain

## 2024-05-30 NOTE — Patient Instructions (Signed)
Please continue to take medications as prescribed. ? ?Please continue to follow heart healthy diet and perform moderate exercise/walking as tolerated. ?

## 2024-05-30 NOTE — Assessment & Plan Note (Signed)
Stable Has insomnia, takes Ambien 

## 2024-05-30 NOTE — Assessment & Plan Note (Signed)
 Likely due to dehydration Recheck BMP Advised to maintain at least 54 ounces of fluid intake in a day

## 2024-05-30 NOTE — Progress Notes (Signed)
 Established Patient Office Visit  Subjective:  Patient ID: Stephanie Noble, female    DOB: 10/05/1955  Age: 68 y.o. MRN: [id number]  CC:  Chief Complaint  Patient presents with   Follow-up    6 month f/u     HPI Stephanie Noble is a 68 y.o. 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  She has been having chronic neck pain, which is constant, variable in intensity, worse with movement and is associated with numbness and weakness in bilateral upper extremities.  She has also noticed bilateral wrist pain and hand numbness and tingling, worse at nighttime. She has had fusion surgery in 2013, 2014 and 2023. She also complains of lower back pain, which refers to the buttocks area.  She takes Robaxin  for muscle spasms.  She takes gabapentin  and Cymbalta  for neuropathy pain.  She is followed by [facility].  She used to see pain specialist in the past.  She also complains of chronic knee pain and swelling, for which she takes Celebrex  as needed with some relief. She sees Dr Addie for OA of knee and had left TKA in 04/24. She has Oxycodone  5 mg PRN for severe pain.  Patient has a longstanding history of insomnia, for which she has been taking Ambien  10 mg qHS.  She is able to maintain sleep for up to 6 hours usually.  She denies any anhedonia, recent weight or appetite changes or suicidal ideation.   Past Medical History:  Diagnosis Date   Anxiety    Asthma    uses albuterol  as needed mostly weather induced.ALbuterol  and Ventolin  inhaler as needed   Chronic back pain    Chronic neck pain    Complication of anesthesia    post-op PEA arrest 04/16/16, thought due to respriatory arrest from sedation, narcotic, hypovolemia   Degenerative disc disease    cervical and lumbar area    Depression    History of bronchitis 2016   History of cervical spinal surgery 12/30/2019   History of lumbar surgery 12/30/2019   Insomnia    takes Ambien  nightly   Muscle spasm    takes Robaxin  daily    Pneumonia    hx of-2016   PONV (postoperative nausea and vomiting)    Weakness    numbness and tingling in legs    Past Surgical History:  Procedure Laterality Date   ANTERIOR CERVICAL DECOMP/DISCECTOMY FUSION N/A 09/05/2021   Procedure: CERVICAL THREE-FOUR ANTERIOR CERVICAL DECOMPRESSION/DISCECTOMY FUSION WITH PARTIAL PLATE REMOVAL AT CERVICAL FOUR-FIVE;  Surgeon: Carollee Lani BROCKS, DO;  Location: [facility] OR;  Service: Neurosurgery;  Laterality: N/A;  3C   BACK SURGERY     x 3 lumbar   CARPAL TUNNEL RELEASE Left 03/18/2024   Procedure: CARPAL TUNNEL RELEASE;  Surgeon: Arlinda Buster, MD;  Location: [facility];  Service: Orthopedics;  Laterality: Left;  LEFT OPEN CARPAL TUNNEL RELEASE/ LEFT THUMB CARPOMETACARPAL ARTHROPLASTY WITH INTERNAL BRACE   CHOLECYSTECTOMY     DILATION AND CURETTAGE OF UTERUS     x 2   FINGER ARTHROPLASTY Left 03/18/2024   Procedure: ARTHROPLASTY, FINGER;  Surgeon: Arlinda Buster, MD;  Location: [facility];  Service: Orthopedics;  Laterality: Left;   JOINT REPLACEMENT     KNEE SURGERY Left    x 2   left knee arthroscopy  x 2   POSTERIOR CERVICAL FUSION/FORAMINOTOMY  05/17/2012   Procedure: POSTERIOR CERVICAL FUSION/FORAMINOTOMY LEVEL 2;  Surgeon: Lamar LELON Peaches, MD;  Location: [facility];  Service: Neurosurgery;  Laterality: N/A;  cervical six to Thoracic One posterior cervical arthrodesis with instrumentation   POSTERIOR CERVICAL FUSION/FORAMINOTOMY N/A 06/17/2022   Procedure: CERVICAL TWO-THREE, CERVICAL THREE-FOUR, CERVICAL FOUR-FIVE POSTERIOR CERVICAL INSTRUMENTATION AND FUSION WITH EXTENSION TO CERVICAL SIX-THORACIC ONE FUSION;  Surgeon: Dawley, Lani BROCKS, DO;  Location: [facility] OR;  Service: Neurosurgery;  Laterality: N/A;    SHOULDER ARTHROSCOPY Left 10/27/2019   Procedure: left shoulder arthroscopy with arthroscopic vs open distal clavical excision;  Surgeon: Addie Cordella Hamilton, MD;  Location[facility];  Service: Orthopedics;  Laterality: Left;   SHOULDER SURGERY     right   SPINE SURGERY     x 5   TOE FUSION Left 09/2016   TOTAL HIP ARTHROPLASTY Left 11/09/2018   Procedure: LEFT TOTAL HIP ARTHROPLASTY DIRECT ANTERIOR APPROACH;  Surgeon: Addie Cordella Hamilton, MD;  Location: [facility];  Service: Orthopedics;  Laterality: Left;   TOTAL KNEE ARTHROPLASTY Left 10/07/2022   Procedure: LEFT TOTAL KNEE ARTHROPLASTY;  Surgeon: Addie Cordella Hamilton, MD;  Location: [facility];  Service: Orthopedics;  Laterality: Left;    Family History  Problem Relation Age of Onset   Dementia Mother    Other Sister    Breast cancer Sister    Alcohol abuse Other    Breast cancer Paternal Aunt     Social History   Socioeconomic History   Marital status: Married    Spouse name: Not on file   Number of children: Not on file   Years of education: Not on file   Highest education level: Not on file  Occupational History   Not on file  Tobacco Use   Smoking status: Never   Smokeless tobacco: Never  Vaping Use   Vaping status: Never Used  Substance and Sexual Activity   Alcohol use: No   Drug use: No   Sexual activity: Yes    Birth control/protection: Post-menopausal  Other Topics Concern   Not on file  Social History Narrative   Not on file   Social Drivers of Health   Financial Resource Strain: Not on file  Food Insecurity: Low Risk  (12/30/2022)   Received from [facility]   Hunger Vital Sign    Within the past 12 months, you worried that your food would run out before you got money to buy more: Never true    Within the past 12 months, the food you bought just didn't last and you didn't have money to get more. : Never true  Transportation Needs: Not on file (12/30/2022)  Physical Activity: Not on file  Stress: No Stress  Concern Present (07/13/2019)   Received from [facility] - Occupational Stress Questionnaire    Feeling of Stress : Not at all  Social Connections: Not on file  Intimate Partner Violence: Not At Risk (03/05/2023)   Received from [facility]   Humiliation, Afraid, Rape, and Kick questionnaire    Within the last year, have you been afraid of your partner or ex-partner?: No    Within the last year, have you been humiliated or emotionally abused in other ways by your partner or ex-partner?: No    Within the last year, have you been kicked, hit, slapped, or otherwise physically hurt by your partner or ex-partner?: No  Within the last year, have you been raped or forced to have any kind of sexual activity by your partner or ex-partner?: No    Outpatient Medications Prior to Visit  Medication Sig Dispense Refill   albuterol  (PROVENTIL ) (2.5 MG/3ML) 0.083% nebulizer solution Take 3 mLs (2.5 mg total) by nebulization every 4 (four) hours as needed for wheezing or shortness of breath. 150 mL 5   albuterol  (VENTOLIN  HFA) 108 (90 Base) MCG/ACT inhaler Inhale 2 sprays QID prn wheezing (Patient taking differently: Inhale 2 puffs into the lungs 4 (four) times daily as needed for wheezing or shortness of breath.) 18 g 0   celecoxib  (CELEBREX ) 100 MG capsule TAKE 1 CAPSULE BY MOUTH TWICE A DAY 60 capsule 3   DULoxetine  (CYMBALTA ) 60 MG capsule TAKE 1 CAPSULE BY MOUTH EVERY DAY 90 capsule 1   fluticasone  (FLONASE ) 50 MCG/ACT nasal spray Place 1 spray into both nostrils daily. 16 g 3   gabapentin  (NEURONTIN ) 300 MG capsule TAKE 2 CAPSULES BY MOUTH IN THE MORNING, 2 CAPSULES AT LUNCH, AND 3 CAPSULES AT BEDTIME 210 capsule 2   methocarbamol  (ROBAXIN ) 750 MG tablet TAKE 1 TABLET BY MOUTH THREE TIMES A DAY AS NEEDED 90 tablet 2   mupirocin  ointment (BACTROBAN ) 2 % APPLY TO AFFECTED AREA TWICE A DAY 22 g 2   oxyCODONE  (ROXICODONE ) 5 MG immediate release tablet Take 1  tablet (5 mg total) by mouth every 6 (six) hours as needed. 30 tablet 0   Propylene Glycol (SYSTANE BALANCE) 0.6 % SOLN Place 1 drop into both eyes 2 (two) times daily as needed (dry eyes).     zolpidem  (AMBIEN ) 10 MG tablet TAKE 1 TABLET BY MOUTH AT BEDTIME AS NEEDED FOR SLEEP. 30 tablet 4   Facility-Administered Medications Prior to Visit  Medication Dose Route Frequency Provider Last Rate Last Admin   lidocaine  (PF) (XYLOCAINE ) 1 % injection 1 mL  1 mL Intradermal Once Golda Lynwood PARAS, MD        Allergies  Allergen Reactions   Vistaril  [Hydroxyzine  Hcl] Other (See Comments)    Pt went into cardiac arrest    Hydromorphone  Hcl Hives    Per report from PACU RN, pt had itching on arrival to pacu then developed hives after dilaudid  given. Discussed with Dr. Carollee and dilaudid  allergy being added to record (09/06/21)    ROS Review of Systems  Constitutional:  Positive for fatigue. Negative for chills and fever.  HENT:  Negative for congestion, sinus pressure, sinus pain and sore throat.   Eyes:  Negative for pain and discharge.  Respiratory:  Negative for cough and shortness of breath.   Cardiovascular:  Negative for chest pain and palpitations.  Gastrointestinal:  Negative for abdominal pain, diarrhea, nausea and vomiting.  Endocrine: Negative for polydipsia and polyuria.  Genitourinary:  Negative for dysuria and hematuria.  Musculoskeletal:  Positive for arthralgias, back pain and neck pain. Negative for neck stiffness.       B/l hand pain Knee pain  Skin:  Negative for rash.  Neurological:  Positive for weakness (B/l UE) and numbness (B/l UE). Negative for dizziness and headaches.  Psychiatric/Behavioral:  Negative for agitation and behavioral problems.       Objective:    Physical Exam Vitals reviewed.  Constitutional:      General: She is not in acute distress.    Appearance: She is not diaphoretic.  HENT:     Head: Normocephalic and atraumatic.     Nose: No congestion.  Mouth/Throat:     Mouth: Mucous membranes are dry.  Eyes:     General: No scleral icterus.    Extraocular Movements: Extraocular movements intact.  Neck:     Comments: Well-healed scar in the cervical (neck) area Cardiovascular:     Rate and Rhythm: Normal rate and regular rhythm.     Heart sounds: Normal heart sounds. No murmur heard. Pulmonary:     Breath sounds: Normal breath sounds. No wheezing or rales.  Musculoskeletal:     Cervical back: Neck supple. Tenderness present. Pain with movement and muscular tenderness present.     Right lower leg: No edema.     Left lower leg: No edema.     Comments: Phalen sign positive  Skin:    General: Skin is warm.     Findings: Erythema (Over nose) present. No rash.  Neurological:     General: No focal deficit present.     Mental Status: She is alert and oriented to person, place, and time.     Sensory: No sensory deficit.     Motor: No weakness.  Psychiatric:        Mood and Affect: Mood normal.        Behavior: Behavior normal.     BP 106/71   Pulse 83   Ht 5' 5 (1.651 m)   Wt 149 lb 6.4 oz (67.8 kg)   SpO2 96%   BMI 24.86 kg/m  Wt Readings from Last 3 Encounters:  05/30/24 149 lb 6.4 oz (67.8 kg)  03/18/24 145 lb 8.1 oz (66 kg)  11/26/23 149 lb 3.2 oz (67.7 kg)    Lab Results  Component Value Date   TSH 2.500 11/26/2023   Lab Results  Component Value Date   WBC 5.0 11/26/2023   HGB 14.1 11/26/2023   HCT 43.3 11/26/2023   MCV 94 11/26/2023   PLT 250 11/26/2023   Lab Results  Component Value Date   NA 139 11/26/2023   K 4.7 11/26/2023   CO2 26 11/26/2023   GLUCOSE 71 11/26/2023   BUN 17 11/26/2023   CREATININE 1.21 (H) 11/26/2023   BILITOT 0.4 11/26/2023   ALKPHOS 84 11/26/2023   AST 36 11/26/2023   ALT 18 11/26/2023   PROT 6.9 11/26/2023   ALBUMIN  4.3 11/26/2023   CALCIUM 9.3 11/26/2023   ANIONGAP 10 09/25/2022   EGFR 49 (L) 11/26/2023   No results found for: CHOL No results found for:  HDL No results found for: LDLCALC No results found for: TRIG No results found for: CHOLHDL No results found for: YHAJ8R    Assessment & Plan:   Problem List Items Addressed This Visit       Nervous and Auditory   Cervical spondylosis with radiculopathy - Primary   S/p C3-C4 decompression with fusion surgery Has persistent neck pain, recent worsening On gabapentin  and Robaxin  On Cymbalta  60 mg once daily Continue Celebrex  100 mg twice daily as needed Followed by Spine Surgery      Relevant Medications   zolpidem  (AMBIEN ) 10 MG tablet     Musculoskeletal and Integument   Arthritis of carpometacarpal (CMC) joint of left thumb   Followed by Hand Surgery Status post left thumb carpometacarpal arthroplasty, left open carpal tunnel release Celebrex  PRN for pain        Other   Insomnia   Well-controlled with Ambien  10 mg at bedtime PDMP reviewed, refilled Ambien       Relevant Medications   zolpidem  (AMBIEN ) 10 MG tablet  PTSD (post-traumatic stress disorder)   Stable Has insomnia, takes Ambien       Elevated serum creatinine   Likely due to dehydration Recheck BMP Advised to maintain at least 54 ounces of fluid intake in a day      Relevant Orders   Basic Metabolic Panel (BMET)   CBC   Other Visit Diagnoses       Encounter for immunization       Relevant Orders   Flu vaccine HIGH DOSE PF(Fluzone Trivalent) (Completed)          Meds ordered this encounter  Medications   zolpidem  (AMBIEN ) 10 MG tablet    Sig: Take 1 tablet (10 mg total) by mouth at bedtime as needed. for sleep    Dispense:  30 tablet    Refill:  5    Follow-up: Return in about 6 months (around 11/28/2024).    Suzzane MARLA Blanch, MD

## 2024-05-31 ENCOUNTER — Ambulatory Visit: Payer: Self-pay | Admitting: Internal Medicine

## 2024-05-31 LAB — CBC
Hematocrit: 43.2 % (ref 34.0–46.6)
Hemoglobin: 14.2 g/dL (ref 11.1–15.9)
MCH: 31 pg (ref 26.6–33.0)
MCHC: 32.9 g/dL (ref 31.5–35.7)
MCV: 94 fL (ref 79–97)
Platelets: 235 x10E3/uL (ref 150–450)
RBC: 4.58 x10E6/uL (ref 3.77–5.28)
RDW: 11.9 % (ref 11.7–15.4)
WBC: 4.1 x10E3/uL (ref 3.4–10.8)

## 2024-05-31 LAB — BASIC METABOLIC PANEL WITH GFR
BUN/Creatinine Ratio: 17 (ref 12–28)
BUN: 14 mg/dL (ref 8–27)
CO2: 27 mmol/L (ref 20–29)
Calcium: 9.4 mg/dL (ref 8.7–10.3)
Chloride: 99 mmol/L (ref 96–106)
Creatinine, Ser: 0.84 mg/dL (ref 0.57–1.00)
Glucose: 79 mg/dL (ref 70–99)
Potassium: 4.6 mmol/L (ref 3.5–5.2)
Sodium: 139 mmol/L (ref 134–144)
eGFR: 76 mL/min/1.73 (ref >59)

## 2024-06-12 NOTE — Progress Notes (Unsigned)
° °  Stephanie Noble - 68 y.o. female MRN [id number]  Date of birth: 12/12/1955  Office Visit Note: Visit Date: 06/13/2024 PCP: Tobie Suzzane POUR, MD Referred by: Tobie Suzzane POUR, MD  Subjective:  HPI: Stephanie Noble is a 68 y.o. female who presents today for follow up 12 weeks status post left thumb carpometacarpal arthroplasty. Left open carpal tunnel release. Left tendon transfer abductor pollicis longus to flexor carpi radialis tendon. Left partial trapezoidectomy. Left de Quervain's release.  Pertinent ROS were reviewed with the patient and found to be negative unless otherwise specified above in HPI.   Assessment & Plan: Visit Diagnoses: No diagnosis found.  Plan: ***  Follow-up: No follow-ups on file.   Meds & Orders: No orders of the defined types were placed in this encounter.  No orders of the defined types were placed in this encounter.    Procedures: No procedures performed       Objective:   Vital Signs: There were no vitals taken for this visit.  Ortho Exam ***  Imaging: No results found.   Novi Calia Afton Alderton, M.D. [facility], Hand Surgery

## 2024-06-13 ENCOUNTER — Ambulatory Visit: Admitting: Orthopedic Surgery

## 2024-06-13 DIAGNOSIS — M180 Bilateral primary osteoarthritis of first carpometacarpal joints: Secondary | ICD-10-CM

## 2024-06-13 DIAGNOSIS — G5603 Carpal tunnel syndrome, bilateral upper limbs: Secondary | ICD-10-CM

## 2024-06-25 ENCOUNTER — Other Ambulatory Visit: Payer: Self-pay | Admitting: Internal Medicine

## 2024-07-16 ENCOUNTER — Other Ambulatory Visit: Payer: Self-pay | Admitting: Internal Medicine

## 2024-07-16 DIAGNOSIS — M4722 Other spondylosis with radiculopathy, cervical region: Secondary | ICD-10-CM

## 2024-07-16 DIAGNOSIS — Z9889 Other specified postprocedural states: Secondary | ICD-10-CM

## 2024-08-02 ENCOUNTER — Other Ambulatory Visit: Payer: Self-pay | Admitting: Internal Medicine

## 2024-08-02 DIAGNOSIS — M4800 Spinal stenosis, site unspecified: Secondary | ICD-10-CM

## 2024-08-02 DIAGNOSIS — G8929 Other chronic pain: Secondary | ICD-10-CM

## 2024-11-28 ENCOUNTER — Ambulatory Visit: Admitting: Internal Medicine
